# Patient Record
Sex: Female | Born: 1940 | Race: White | Hispanic: No | Marital: Married | Smoking: Former smoker
Health system: Southern US, Community
[De-identification: ages and names within clinical notes are randomized; demographics above are authoritative.]

## PROBLEM LIST (undated history)

## (undated) DIAGNOSIS — M810 Age-related osteoporosis without current pathological fracture: Secondary | ICD-10-CM

## (undated) DIAGNOSIS — K5792 Diverticulitis of intestine, part unspecified, without perforation or abscess without bleeding: Secondary | ICD-10-CM

## (undated) DIAGNOSIS — E785 Hyperlipidemia, unspecified: Secondary | ICD-10-CM

## (undated) DIAGNOSIS — K219 Gastro-esophageal reflux disease without esophagitis: Secondary | ICD-10-CM

## (undated) DIAGNOSIS — Z8489 Family history of other specified conditions: Secondary | ICD-10-CM

## (undated) DIAGNOSIS — I219 Acute myocardial infarction, unspecified: Secondary | ICD-10-CM

## (undated) DIAGNOSIS — I951 Orthostatic hypotension: Secondary | ICD-10-CM

## (undated) DIAGNOSIS — T8859XA Other complications of anesthesia, initial encounter: Secondary | ICD-10-CM

## (undated) DIAGNOSIS — Z8619 Personal history of other infectious and parasitic diseases: Secondary | ICD-10-CM

## (undated) DIAGNOSIS — I10 Essential (primary) hypertension: Secondary | ICD-10-CM

## (undated) DIAGNOSIS — C4491 Basal cell carcinoma of skin, unspecified: Secondary | ICD-10-CM

## (undated) DIAGNOSIS — D239 Other benign neoplasm of skin, unspecified: Secondary | ICD-10-CM

## (undated) HISTORY — PX: GUM SURGERY: SHX658

## (undated) HISTORY — DX: Other benign neoplasm of skin, unspecified: D23.9

## (undated) HISTORY — DX: Hyperlipidemia, unspecified: E78.5

## (undated) HISTORY — DX: Acute myocardial infarction, unspecified: I21.9

## (undated) HISTORY — DX: Diverticulitis of intestine, part unspecified, without perforation or abscess without bleeding: K57.92

## (undated) HISTORY — DX: Gastro-esophageal reflux disease without esophagitis: K21.9

## (undated) HISTORY — DX: Essential (primary) hypertension: I10

## (undated) HISTORY — PX: OTHER SURGICAL HISTORY: SHX169

## (undated) HISTORY — DX: Basal cell carcinoma of skin, unspecified: C44.91

## (undated) HISTORY — DX: Personal history of other infectious and parasitic diseases: Z86.19

## (undated) HISTORY — DX: Age-related osteoporosis without current pathological fracture: M81.0

## (undated) HISTORY — PX: ABDOMINAL HYSTERECTOMY: SHX81

---

## 2003-09-07 ENCOUNTER — Other Ambulatory Visit: Payer: Self-pay

## 2004-05-13 ENCOUNTER — Encounter: Payer: Self-pay | Admitting: Unknown Physician Specialty

## 2004-06-13 ENCOUNTER — Encounter: Payer: Self-pay | Admitting: Unknown Physician Specialty

## 2004-06-26 ENCOUNTER — Ambulatory Visit: Payer: Self-pay | Admitting: Specialist

## 2004-07-13 ENCOUNTER — Encounter: Payer: Self-pay | Admitting: Unknown Physician Specialty

## 2004-08-13 ENCOUNTER — Encounter: Payer: Self-pay | Admitting: Unknown Physician Specialty

## 2004-09-12 ENCOUNTER — Ambulatory Visit: Payer: Self-pay | Admitting: Specialist

## 2004-09-13 ENCOUNTER — Encounter: Payer: Self-pay | Admitting: Unknown Physician Specialty

## 2004-11-02 ENCOUNTER — Ambulatory Visit: Payer: Self-pay | Admitting: Specialist

## 2005-03-12 ENCOUNTER — Ambulatory Visit: Payer: Self-pay | Admitting: Specialist

## 2005-10-29 ENCOUNTER — Ambulatory Visit: Payer: Self-pay | Admitting: Specialist

## 2005-11-06 ENCOUNTER — Ambulatory Visit: Payer: Self-pay | Admitting: Specialist

## 2005-11-12 ENCOUNTER — Ambulatory Visit: Payer: Self-pay | Admitting: Unknown Physician Specialty

## 2005-12-31 ENCOUNTER — Ambulatory Visit: Payer: Self-pay | Admitting: Unknown Physician Specialty

## 2006-02-11 ENCOUNTER — Ambulatory Visit: Payer: Self-pay | Admitting: Specialist

## 2006-09-20 ENCOUNTER — Ambulatory Visit: Payer: Self-pay | Admitting: Specialist

## 2006-11-19 ENCOUNTER — Ambulatory Visit: Payer: Self-pay | Admitting: Specialist

## 2007-11-11 DIAGNOSIS — C4491 Basal cell carcinoma of skin, unspecified: Secondary | ICD-10-CM

## 2007-11-11 HISTORY — DX: Basal cell carcinoma of skin, unspecified: C44.91

## 2007-11-20 ENCOUNTER — Ambulatory Visit: Payer: Self-pay | Admitting: Internal Medicine

## 2007-11-25 ENCOUNTER — Encounter: Payer: Self-pay | Admitting: Internal Medicine

## 2007-12-08 ENCOUNTER — Ambulatory Visit: Payer: Self-pay | Admitting: Internal Medicine

## 2007-12-12 ENCOUNTER — Encounter: Payer: Self-pay | Admitting: Internal Medicine

## 2008-11-09 DIAGNOSIS — L570 Actinic keratosis: Secondary | ICD-10-CM

## 2008-11-09 HISTORY — DX: Actinic keratosis: L57.0

## 2008-12-06 ENCOUNTER — Ambulatory Visit: Payer: Self-pay | Admitting: Internal Medicine

## 2009-01-13 ENCOUNTER — Encounter: Payer: Self-pay | Admitting: Obstetrics & Gynecology

## 2009-02-10 ENCOUNTER — Encounter: Payer: Self-pay | Admitting: Obstetrics & Gynecology

## 2009-03-13 ENCOUNTER — Encounter: Payer: Self-pay | Admitting: Obstetrics & Gynecology

## 2009-12-08 ENCOUNTER — Ambulatory Visit: Payer: Self-pay | Admitting: Internal Medicine

## 2010-12-11 ENCOUNTER — Ambulatory Visit: Payer: Self-pay | Admitting: Internal Medicine

## 2011-12-21 ENCOUNTER — Ambulatory Visit: Payer: Self-pay | Admitting: Unknown Physician Specialty

## 2011-12-25 LAB — PATHOLOGY REPORT

## 2012-01-22 ENCOUNTER — Ambulatory Visit: Payer: Self-pay | Admitting: Internal Medicine

## 2012-12-07 ENCOUNTER — Emergency Department: Payer: Self-pay | Admitting: Internal Medicine

## 2012-12-07 LAB — URINALYSIS, COMPLETE
Bacteria: NONE SEEN
RBC,UR: 11359 /HPF (ref 0–5)
Specific Gravity: 1.014 (ref 1.003–1.030)
Squamous Epithelial: NONE SEEN
WBC UR: 1157 /HPF (ref 0–5)

## 2012-12-09 LAB — URINE CULTURE

## 2013-01-30 ENCOUNTER — Ambulatory Visit: Payer: Self-pay | Admitting: Internal Medicine

## 2013-05-14 ENCOUNTER — Emergency Department: Payer: Self-pay | Admitting: Emergency Medicine

## 2013-05-17 ENCOUNTER — Emergency Department: Payer: Self-pay | Admitting: Emergency Medicine

## 2013-05-17 LAB — COMPREHENSIVE METABOLIC PANEL
Albumin: 3.7 g/dL (ref 3.4–5.0)
Alkaline Phosphatase: 62 U/L (ref 50–136)
Anion Gap: 3 — ABNORMAL LOW (ref 7–16)
BUN: 10 mg/dL (ref 7–18)
Bilirubin,Total: 0.3 mg/dL (ref 0.2–1.0)
Calcium, Total: 8.7 mg/dL (ref 8.5–10.1)
Chloride: 103 mmol/L (ref 98–107)
Co2: 30 mmol/L (ref 21–32)
Creatinine: 0.7 mg/dL (ref 0.60–1.30)
EGFR (African American): >60
EGFR (Non-African Amer.): >60
Glucose: 94 mg/dL (ref 65–99)
Osmolality: 271 (ref 275–301)
Potassium: 3.9 mmol/L (ref 3.5–5.1)
SGOT(AST): 24 U/L (ref 15–37)
SGPT (ALT): 17 U/L (ref 12–78)
Sodium: 136 mmol/L (ref 136–145)
Total Protein: 6.7 g/dL (ref 6.4–8.2)

## 2013-05-17 LAB — URINALYSIS, COMPLETE
Bacteria: NONE SEEN
Bilirubin,UR: NEGATIVE
Blood: NEGATIVE
Glucose,UR: NEGATIVE mg/dL (ref 0–75)
Ketone: NEGATIVE
Leukocyte Esterase: NEGATIVE
Nitrite: NEGATIVE
Ph: 7 (ref 4.5–8.0)
Protein: NEGATIVE
RBC,UR: 3 /HPF (ref 0–5)
Specific Gravity: 1.013 (ref 1.003–1.030)
Squamous Epithelial: <1
WBC UR: <1 /HPF (ref 0–5)

## 2013-05-17 LAB — LIPASE, BLOOD: Lipase: 159 U/L (ref 73–393)

## 2013-05-17 LAB — CBC
HCT: 41.7 % (ref 35.0–47.0)
HGB: 14.5 g/dL (ref 12.0–16.0)
MCH: 29.2 pg (ref 26.0–34.0)
MCHC: 34.6 g/dL (ref 32.0–36.0)
MCV: 84 fL (ref 80–100)
Platelet: 239 10*3/uL (ref 150–440)
RBC: 4.95 10*6/uL (ref 3.80–5.20)
RDW: 13.6 % (ref 11.5–14.5)
WBC: 7.7 10*3/uL (ref 3.6–11.0)

## 2013-08-13 DIAGNOSIS — Z8619 Personal history of other infectious and parasitic diseases: Secondary | ICD-10-CM

## 2013-08-13 HISTORY — DX: Personal history of other infectious and parasitic diseases: Z86.19

## 2014-01-28 ENCOUNTER — Ambulatory Visit: Payer: Self-pay | Admitting: Ophthalmology

## 2014-01-28 DIAGNOSIS — I499 Cardiac arrhythmia, unspecified: Secondary | ICD-10-CM

## 2014-01-28 DIAGNOSIS — Z01810 Encounter for preprocedural cardiovascular examination: Secondary | ICD-10-CM

## 2014-02-01 ENCOUNTER — Ambulatory Visit: Payer: Self-pay | Admitting: Internal Medicine

## 2014-02-22 ENCOUNTER — Ambulatory Visit: Payer: Self-pay | Admitting: Ophthalmology

## 2014-11-03 DIAGNOSIS — I219 Acute myocardial infarction, unspecified: Secondary | ICD-10-CM

## 2014-11-03 HISTORY — PX: CARDIAC CATHETERIZATION: SHX172

## 2014-11-03 HISTORY — DX: Acute myocardial infarction, unspecified: I21.9

## 2014-11-26 ENCOUNTER — Ambulatory Visit
Admit: 2014-11-26 | Disposition: A | Discharge status: Home / Self Care | Payer: Self-pay | Attending: Internal Medicine | Admitting: Internal Medicine

## 2014-11-26 ENCOUNTER — Other Ambulatory Visit: Payer: Self-pay | Admitting: Internal Medicine

## 2014-11-26 DIAGNOSIS — Z1231 Encounter for screening mammogram for malignant neoplasm of breast: Secondary | ICD-10-CM

## 2014-12-01 ENCOUNTER — Encounter (INDEPENDENT_AMBULATORY_CARE_PROVIDER_SITE_OTHER): Payer: Self-pay

## 2014-12-01 ENCOUNTER — Encounter: Payer: Self-pay | Admitting: Cardiovascular Disease

## 2014-12-01 ENCOUNTER — Ambulatory Visit (INDEPENDENT_AMBULATORY_CARE_PROVIDER_SITE_OTHER): Payer: Medicare Other | Admitting: Cardiovascular Disease

## 2014-12-01 VITALS — BP 148/76 | HR 55 | Ht 62.0 in | Wt 108.5 lb

## 2014-12-01 DIAGNOSIS — R5383 Other fatigue: Secondary | ICD-10-CM

## 2014-12-01 DIAGNOSIS — R55 Syncope and collapse: Secondary | ICD-10-CM | POA: Diagnosis not present

## 2014-12-01 DIAGNOSIS — F419 Anxiety disorder, unspecified: Secondary | ICD-10-CM

## 2014-12-01 DIAGNOSIS — I5181 Takotsubo syndrome: Secondary | ICD-10-CM

## 2014-12-01 DIAGNOSIS — E785 Hyperlipidemia, unspecified: Secondary | ICD-10-CM

## 2014-12-01 DIAGNOSIS — I420 Dilated cardiomyopathy: Secondary | ICD-10-CM | POA: Insufficient documentation

## 2014-12-01 MED ORDER — CARVEDILOL 3.125 MG PO TABS: 3.1250 mg | ORAL_TABLET | Freq: Two times a day (BID) | ORAL | Status: DC

## 2014-12-01 MED ORDER — LISINOPRIL 2.5 MG PO TABS: 2.5000 mg | ORAL_TABLET | Freq: Every day | ORAL | Status: DC

## 2014-12-01 NOTE — Progress Notes (Signed)
Patient ID: Debra Moody, female    DOB: [birth year], 74 y.o.   MRN: [id number]  HPI Comments: [name] is a pleasant 74 year old woman with previous history of GERD, diverticulitis, hysterectomy in the 80s, prior bladder surgery, elbow fracture October 2014, shingles September 2015 presents to establish care after recent episode of chest pain, elevated cardiac enzymes, termination of stress related cardiomyopathy.  She reports that she was traveling out of state, was in [place] when she developed amnesia type symptoms. Her husband took her to the hospital. She had MRI and CT of the head that showed no stroke. Further workup showed abnormal EKG, abnormal echocardiogram, elevated troponin up to more than 3. Ischemia could not be excluded and she was taken to the cardiac catheterization lab 11/03/2014. 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 Denies having shortness of breath. She has been drinking significant amounts of fluid thinking she needed to do this to washout the medications.  No lower extremity edema or weight gain. Possibly has some abdominal bloating. Lab work done in the hospital March 2016 shows total cholesterol 199, LDL 113, HDL 79  EKG on today's visit shows normal sinus rhythm with rate 55 bpm, T-wave abnormality noted in lead 1 and aVL   Allergies  Allergen Reactions  . Alendronate Sodium Other (See Comments)  . Ibandronic Acid Other (See Comments)  . Simvastatin Other (See Comments)    Myalgia.  Marland Kitchen Amoxicillin-Pot Clavulanate Rash     Outpatient Encounter Prescriptions as of 12/01/2014  Medication Sig  . aspirin 81 MG tablet Take 81 mg by mouth daily.  . Biotin 1000 MCG tablet Take 1,000 mcg by mouth daily.  . Calcium-Magnesium-Vitamin D (CALCIUM 500 PO) Take 1,000 mg by mouth daily.  . carvedilol (COREG) 3.125 MG tablet Take 1 tablet (3.125 mg total) by mouth 2 (two) times daily with a meal.  . cetirizine (ZYRTEC) 10 MG tablet Take 10 mg by mouth daily.  . Cetirizine HCl 10 MG CAPS Take 10 mg by mouth daily.   . Coenzyme Q10 (COQ10) 100 MG CAPS Take 100 mg by mouth daily.  . diphenhydrAMINE (BENADRYL) 25 mg capsule Take 25 mg by mouth every 6 (six) hours as needed.   . ezetimibe (ZETIA) 10 MG tablet Take 10 mg by mouth daily.  . hyoscyamine (LEVSIN, ANASPAZ) 0.125 MG tablet Take by mouth.  . magnesium oxide (MAG-OX) 400 MG tablet Take 400 mg by mouth daily.  . Melatonin 5 MG TBDP Take 5 mg by mouth as needed.  . Omega-3 Fatty Acids (FISH OIL) 1000 MG CAPS Take 1,200 mg by mouth daily.   . pantoprazole (PROTONIX) 40 MG tablet Take 40 mg by mouth 2 (two) times daily.  Marland Kitchen Phenylephrine-Acetaminophen 5-325 MG TABS Take by mouth as needed.   . Polyethylene Glycol 3350 (MIRALAX PO) Take by mouth every evening.  . Probiotic Product (PROBIOTIC DAILY PO) Take by mouth daily.  . ranitidine (ZANTAC) 75 MG tablet Take 150 mg by mouth 2 (two) times daily.  . Simethicone 125 MG TABS Take by mouth as needed.   . Vitamin D, Cholecalciferol, 400 UNITS CAPS  Take by mouth daily.  . [DISCONTINUED] carvedilol (COREG) 3.125 MG tablet Take 3.125 mg by mouth 2 (two) times daily with a meal.   . fexofenadine (ALLEGRA) 180 MG tablet Take by mouth.  Marland Kitchen lisinopril (PRINIVIL,ZESTRIL) 2.5 MG tablet Take 1 tablet (2.5 mg total) by mouth daily.  . Melatonin 5 MG TABS Take by mouth.  . [DISCONTINUED] lisinopril (PRINIVIL,ZESTRIL) 2.5 MG tablet Take by mouth.  . [DISCONTINUED] Polyethyl Glycol-Propyl Glycol 0.4-0.3 % GEL Apply to eye.    Past  Medical History  Diagnosis Date  . Osteoporosis   . Hypertension   . History of shingles 2015  . Diverticulitis   . 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 Skin cancer, basal cell   . GERD (gastroesophageal reflux disease)   . Hyperlipidemia     Past Surgical History  Procedure Laterality Date  . Abdominal hysterectomy    . Gum surgery    . Cardiac catheterization  11/03/2014    Social History  reports that she quit smoking about 15 years ago. Her smoking use included Cigarettes. She quit after 18 years of use. She does not have any smokeless tobacco history on file. She reports that she does not drink alcohol or use illicit drugs.  Family History family history includes Heart attack in her mother; Heart attack (age of onset: 4) in her father; Heart disease in her father and mother.  Review of Systems  Constitutional: Positive for fatigue.  Respiratory: Negative.   Cardiovascular: Negative.   Gastrointestinal: Negative.   Musculoskeletal: Negative.   Skin: Negative.   Neurological: Positive for weakness.  Hematological: Negative.   Psychiatric/Behavioral: Negative.   All other systems reviewed and are negative.   BP 148/76 mmHg  Pulse 55  Ht 5\' 2"  (1.575 m)  Wt 108 lb 8 oz (49.215 kg)  BMI 19.84 kg/m2  Physical Exam  Constitutional: She is oriented to person, place, and time. She appears well-developed and well-nourished.  HENT:  Head: Normocephalic.  Nose: Nose normal.  Mouth/Throat: Oropharynx is clear and moist.  Eyes: Conjunctivae are normal. Pupils are equal, round, and reactive to light.  Neck: Normal range of motion. Neck supple. No JVD present.  Cardiovascular: Normal rate, regular rhythm, S1 normal, S2 normal, normal heart sounds and intact distal pulses.  Exam reveals no gallop and no friction rub.   No murmur heard. Pulmonary/Chest: Effort normal and breath sounds normal. No respiratory distress. She has no  wheezes. She has no rales. She exhibits no tenderness.  Abdominal: Soft. Bowel sounds are normal. She exhibits no distension. There is no tenderness.  Musculoskeletal: Normal range of motion. She exhibits no edema or tenderness.  Lymphadenopathy:    She has no cervical adenopathy.  Neurological: She is alert and oriented to person, place, and time. Coordination normal.  Skin: Skin is warm and dry. No rash noted. No erythema.  Psychiatric: She has a normal mood and affect. Her behavior is normal. Judgment and thought content normal.    Assessment and Plan  Nursing note and vitals reviewed.

## 2014-12-01 NOTE — Assessment & Plan Note (Signed)
She reports being unable to tolerate statins. She'll continue on Zetia 10 mg daily Most recent total cholesterol 199

## 2014-12-01 NOTE — Patient Instructions (Signed)
You are doing well.  Please continue the coreg/carvedilol twice a day  Change the lisinopril to dinner or evening  We will order an echocardiogram for cardiomyopathy  Consider cardiac rehab  Please call us if you have new issues that need to be addressed before your next appt.  Your physician wants you to follow-up in: 3 months.  You will receive a reminder letter in the mail two months in advance. If you don't receive a letter, please call our office to schedule the follow-up appointment.

## 2014-12-01 NOTE — Assessment & Plan Note (Signed)
Recent cardiac catheterization confirming normal coronaries though reversible coronary spasm documented. Stress related cardiomyopathy suggested in catheterization report.  Currently feels better, tolerating low-dose Coreg and lisinopril. Echocardiogram report not seen though while in the hospital in March 2016, there was reference to depressed ejection fraction. She is requesting repeat echocardiogram to evaluate her EF. This will be ordered for her

## 2014-12-01 NOTE — Assessment & Plan Note (Signed)
She reports that she takes alprazolam low-dose as needed for anxiety, sleep

## 2014-12-01 NOTE — Assessment & Plan Note (Signed)
By catheterization, ejection fraction estimated at 25%. Also with regions of akinesis consistent with stress-related cardiopathy. Unable to advance her carvedilol given her low heart rate. She also reports having low blood pressure at home with periods of orthostasis. We'll continue low-dose lisinopril for now

## 2014-12-03 ENCOUNTER — Encounter
Admit: 2014-12-03 | Disposition: A | Discharge status: Home / Self Care | Payer: Self-pay | Attending: Internal Medicine | Admitting: Internal Medicine

## 2014-12-04 NOTE — Op Note (Signed)
PATIENT NAME:  Debra Moody, Debra Moody MR#:  [id number] DATE OF BIRTH:  [birth year]  DATE OF PROCEDURE:  02/22/2014  PREOPERATIVE DIAGNOSIS: Cataract, right eye.   POSTOPERATIVE DIAGNOSIS: Cataract, right eye.   PROCEDURE PERFORMED: Extracapsular cataract extraction using phacoemulsification with placement of Alcon [id number], 17.0-diopter posterior chamber lens, serial number [id number].   ANESTHESIA: 4% lidocaine and 0.75% Marcaine a 50-50 mixture with 10 units/mL of HyoMax added, given as a peribulbar.   ANESTHESIOLOGIST: Dr. Verl Dicker   COMPLICATIONS: None.   ESTIMATED BLOOD LOSS: Less than 1 mL.  ALLERGIES: LATEX.   DESCRIPTION OF PROCEDURE: The patient was latex allergic to latex precautions were taken throughout the procedure. The patient was brought to the operating room and given IV sedation and peribulbar block. She was then prepped and draped in the usual fashion. The vertical rectus muscles were imbricated using 5-0 silk sutures, bridle sutures, and hemostasis was obtained with a superior limbus with cautery. A partial thickness scleral groove was made at the limbus and dissected anteriorly into the clear cornea with an Alcon crescent knife. The anterior chamber was entered superonasally through clear cornea with a paracentesis knife and through the lamellar dissection with a 2.6-mm keratome. DisCoVisc was used to place the aqueous. A continuous tear circular capsulorrhexis was carried out. Hydrodissection was used to loosen the nucleus and phacoemulsification was carried out in divide and conquer technique. Total ultrasound time was 1 minute and 14 seconds with an average power of 15.9%. CDE was 20.20. Irrigation-aspiration was used to remove the residual cortex. The capsular bag was inflated with DisCoVisc and the intraocular lens was inserted in the capsular bag using a [employer]. Irrigation-aspiration was used to remove the residual DisCoVisc. The wound was inflated with balanced salt and  found to be leak free. Miochol was used instead of Miostat due to latex allergy. This was injected via the paracentesis track. The wound was checked for leaks again and none were found. A tenth of a millimeter of cefuroxime was injected via the paracentesis track containing 1 mg of drug. The wound was checked for leaks a third time and none were found. The bridle sutures were removed and 2 drops of Vigamox were placed in the eye. A shield was placed on the eye and the patient was discharged to the recovery room in good condition    ____________________________ Loura Back. Powell Halbert, MD sad:lt D: 02/22/2014 12:51:30 ET T: 02/23/2014 00:31:12 ET JOB#: [id number]  cc: Remo Lipps A. Bonny Vanleeuwen, MD, <Dictator> Martie Lee MD ELECTRONICALLY SIGNED 03/01/2014 12:33

## 2014-12-13 ENCOUNTER — Other Ambulatory Visit: Payer: Self-pay | Admitting: Cardiovascular Disease

## 2014-12-13 DIAGNOSIS — I429 Cardiomyopathy, unspecified: Secondary | ICD-10-CM

## 2014-12-20 ENCOUNTER — Encounter: Payer: Medicare Other | Attending: Internal Medicine | Admitting: *Deleted

## 2014-12-20 DIAGNOSIS — I214 Non-ST elevation (NSTEMI) myocardial infarction: Secondary | ICD-10-CM | POA: Diagnosis present

## 2014-12-20 NOTE — Progress Notes (Signed)
Daily Session Note  Patient Details  Name: Debra Moody MRN: [id number] Date of Birth: 1940/10/08 Referring Provider:  Rusty Aus, MD  Encounter Date: 12/20/2014  Check In:     Session Check In - 12/20/14 1011    Check-In   Staff Present Heath Lark RN, BSN, CCRP;Eduin Friedel [place] MS, ACSM CEP Exercise Physiologist;Kelly Alfonso Patten, ACSM CEP Exercise Physiologist   ER physicians immediately available to respond to emergencies See telemetry face sheet for immediately available ER MD   Warm-up and Cool-down Performed on first and last piece of equipment   VAD Patient? No   Pain Assessment   Currently in Pain? No/denies   Multiple Pain Sites No         Goals Met:  Proper associated with RPD/PD & O2 Sat Exercise tolerated well Personal goals reviewed  Goals Unmet:  Not Applicable  Goals Comments: No cardiac symptoms reported; daily exercise goals completed. Went over all equipment and new patient orientation. Debra Moody did well on the equipment and confirmed understanding of her goals and the program structure.      Dr. Emily Filbert is Medical Director for [facility] and [facility].

## 2014-12-21 ENCOUNTER — Ambulatory Visit (INDEPENDENT_AMBULATORY_CARE_PROVIDER_SITE_OTHER): Payer: Medicare Other

## 2014-12-21 ENCOUNTER — Other Ambulatory Visit: Payer: Self-pay

## 2014-12-21 DIAGNOSIS — I420 Dilated cardiomyopathy: Secondary | ICD-10-CM | POA: Diagnosis not present

## 2014-12-21 DIAGNOSIS — I429 Cardiomyopathy, unspecified: Secondary | ICD-10-CM

## 2014-12-22 ENCOUNTER — Encounter: Payer: Medicare Other | Admitting: *Deleted

## 2014-12-22 DIAGNOSIS — I214 Non-ST elevation (NSTEMI) myocardial infarction: Secondary | ICD-10-CM | POA: Diagnosis not present

## 2014-12-22 NOTE — Progress Notes (Signed)
Daily Session Note  Patient Details  Name: Debra Moody MRN: [id number] Date of Birth: Nov 17, 1940 Referring Provider:  Rusty Aus, MD  Encounter Date: 12/22/2014  Check In:     Session Check In - 12/22/14 0846    Check-In   Staff Present Heath Lark RN, BSN, CCRP;Averee Harb Dillard Essex MS, ACSM CEP Exercise Physiologist;Steven Way BS, ACSM EP-C, Exercise Physiologist   ER physicians immediately available to respond to emergencies See telemetry face sheet for immediately available ER MD   Medication changes reported     No   Fall or balance concerns reported    No   Warm-up and Cool-down Performed on first and last piece of equipment   VAD Patient? No   Pain Assessment   Currently in Pain? No/denies   Multiple Pain Sites No         Goals Met:  Proper associated with RPD/PD & O2 Sat Independence with exercise equipment Personal goals reviewed No report of cardiac concerns or symptoms  Goals Unmet:  Not Applicable  Goals Comments: No cardiac symptoms reported; daily exercise goals completed     Dr. Emily Filbert is Medical Director for [place] [facility].

## 2014-12-23 DIAGNOSIS — I214 Non-ST elevation (NSTEMI) myocardial infarction: Secondary | ICD-10-CM | POA: Diagnosis not present

## 2014-12-23 NOTE — Progress Notes (Signed)
Daily Session Note  Patient Details  Name: Debra Moody MRN: [id number] Date of Birth: 1940-11-18 Referring Provider:  Rusty Aus, MD  Encounter Date: 12/23/2014  Check In:     Session Check In - 12/23/14 1700    Check-In   Staff Present Lestine Box BS, ACSM EP-C, Exercise Physiologist;Carroll Enterkin RN, BSN;Diane Joya Gaskins RN, BSN   ER physicians immediately available to respond to emergencies See telemetry face sheet for immediately available ER MD   Medication changes reported     No   Fall or balance concerns reported    No   Warm-up and Cool-down Performed on first and last piece of equipment   VAD Patient? No   Pain Assessment   Currently in Pain? No/denies         Goals Met:  Proper associated with RPD/PD & O2 Sat Exercise tolerated well No report of cardiac concerns or symptoms Strength training completed today  Goals Unmet:  Not Applicable  Goals Comments:    Dr. Emily Filbert is Medical Director for [facility] and [facility].

## 2014-12-27 ENCOUNTER — Encounter: Payer: Medicare Other | Admitting: *Deleted

## 2014-12-27 DIAGNOSIS — I214 Non-ST elevation (NSTEMI) myocardial infarction: Secondary | ICD-10-CM | POA: Diagnosis not present

## 2014-12-27 NOTE — Progress Notes (Signed)
Daily Session Note  Patient Details  Name: Debra Moody MRN: [id number] Date of Birth: 05/04/1941 Referring Provider:  Miller, Mark F, MD  Encounter Date: 12/27/2014  Check In:     Session Check In - 12/27/14 0832    Check-In   Staff Present Susanne Bice RN, BSN, CCRP;Kelly Hayes BS, ACSM CEP Exercise Physiologist;Renee MacMillan MS, ACSM CEP Exercise Physiologist   ER physicians immediately available to respond to emergencies See telemetry face sheet for immediately available ER MD   Medication changes reported     No   Fall or balance concerns reported    No   Warm-up and Cool-down Performed on first and last piece of equipment   VAD Patient? No   Pain Assessment   Currently in Pain? No/denies   Multiple Pain Sites No         Goals Met:  Independence with exercise equipment Exercise tolerated well No report of cardiac concerns or symptoms  Goals Unmet:  Not Applicable  Goals Comments:   Dr. Mark Miller is Medical Director for [facility] and [facility]. 

## 2014-12-29 DIAGNOSIS — I214 Non-ST elevation (NSTEMI) myocardial infarction: Secondary | ICD-10-CM | POA: Diagnosis not present

## 2014-12-29 NOTE — Progress Notes (Signed)
Daily Session Note  Patient Details  Name: TARAJI MUNGO MRN: [id number] Date of Birth: [birth year] Referring Provider:  Rusty Aus, MD  Encounter Date: 12/29/2014  Check In:     Session Check In - 12/29/14 0847    Check-In   Staff Present Heath Lark RN, BSN, CCRP;Jaine Estabrooks BS, ACSM EP-C, Exercise Physiologist;Renee Dillard Essex MS, ACSM CEP Exercise Physiologist   ER physicians immediately available to respond to emergencies See telemetry face sheet for immediately available ER MD   Medication changes reported     No   Fall or balance concerns reported    No   Warm-up and Cool-down Performed on first and last piece of equipment   VAD Patient? No   Pain Assessment   Currently in Pain? No/denies         Goals Met:  Proper associated with RPD/PD & O2 Sat Exercise tolerated well No report of cardiac concerns or symptoms Strength training completed today  Goals Unmet:  Not Applicable  Goals Comments:    Dr. Emily Filbert is Medical Director for [place] [facility].

## 2014-12-31 ENCOUNTER — Encounter: Payer: Medicare Other | Admitting: *Deleted

## 2014-12-31 DIAGNOSIS — I214 Non-ST elevation (NSTEMI) myocardial infarction: Secondary | ICD-10-CM | POA: Diagnosis not present

## 2014-12-31 NOTE — Progress Notes (Signed)
Daily Session Note  Patient Details  Name: Debra Moody MRN: [id number] Date of Birth: 07/26/1941 Referring Provider:  Miller, Mark F, MD  Encounter Date: 12/31/2014  Check In:      Goals Met:  Independence with exercise equipment Changing diet to healthy choices, watchign portion sizes Strength training completed today  Goals Unmet:  Not Applicable  Goals Comments:    Dr. Mark Miller is Medical Director for [facility] and [facility]. 

## 2015-01-02 ENCOUNTER — Encounter: Payer: Self-pay | Admitting: *Deleted

## 2015-01-02 NOTE — Progress Notes (Signed)
Input data from previous EMR to update the Individualized Treatment Plan.

## 2015-01-03 ENCOUNTER — Encounter: Payer: Medicare Other | Admitting: *Deleted

## 2015-01-03 DIAGNOSIS — I214 Non-ST elevation (NSTEMI) myocardial infarction: Secondary | ICD-10-CM | POA: Diagnosis not present

## 2015-01-03 NOTE — Progress Notes (Signed)
Daily Session Note  Patient Details  Name: Debra Moody MRN: [id number] Date of Birth: 11/17/1940 Referring Provider:  Miller, Mark F, MD  Encounter Date: 01/03/2015  Check In:     Session Check In - 01/03/15 0957    Check-In   Staff Present Susanne Bice RN, BSN, CCRP;Kelly Hayes BS, ACSM CEP Exercise Physiologist;Renee MacMillan MS, ACSM CEP Exercise Physiologist   ER physicians immediately available to respond to emergencies See telemetry face sheet for immediately available ER MD   Medication changes reported     No   Fall or balance concerns reported    No   Warm-up and Cool-down Performed on first and last piece of equipment   VAD Patient? No   Pain Assessment   Currently in Pain? No/denies   Multiple Pain Sites No         Goals Met:  Independence with exercise equipment Exercise tolerated well No report of cardiac concerns or symptoms  Goals Unmet:  Not Applicable  Goals Comments:    Dr. Mark Miller is Medical Director for [facility] and [facility]. 

## 2015-01-04 ENCOUNTER — Encounter: Payer: Self-pay | Admitting: *Deleted

## 2015-01-04 NOTE — Progress Notes (Signed)
Cardiac Individual Treatment Plan  Patient Details  Name: Debra Moody MRN: [id number] Date of Birth: Aug 09, 1941 Referring Provider:  Dr. Loleta Chance Initial Encounter Date:  12/29/2014  Patient's Home Medications on Admission:  Current outpatient prescriptions:  .  aspirin 81 MG tablet, Take 81 mg by mouth daily., Disp: , Rfl:  .  Biotin 1000 MCG tablet, Take 1,000 mcg by mouth daily., Disp: , Rfl:  .  Calcium-Magnesium-Vitamin D (CALCIUM 500 PO), Take 1,000 mg by mouth daily., Disp: , Rfl:  .  carvedilol (COREG) 3.125 MG tablet, Take 1 tablet (3.125 mg total) by mouth 2 (two) times daily with a meal., Disp: 180 tablet, Rfl: 3 .  cetirizine (ZYRTEC) 10 MG tablet, Take 10 mg by mouth daily., Disp: , Rfl:  .  Cetirizine HCl 10 MG CAPS, Take 10 mg by mouth daily. , Disp: , Rfl:  .  Coenzyme Q10 (COQ10) 100 MG CAPS, Take 100 mg by mouth daily., Disp: , Rfl:  .  diphenhydrAMINE (BENADRYL) 25 mg capsule, Take 25 mg by mouth every 6 (six) hours as needed. , Disp: , Rfl:  .  ezetimibe (ZETIA) 10 MG tablet, Take 10 mg by mouth daily., Disp: , Rfl:  .  fexofenadine (ALLEGRA) 180 MG tablet, Take by mouth., Disp: , Rfl:  .  hyoscyamine (LEVSIN, ANASPAZ) 0.125 MG tablet, Take by mouth., Disp: , Rfl:  .  lisinopril (PRINIVIL,ZESTRIL) 2.5 MG tablet, Take 1 tablet (2.5 mg total) by mouth daily., Disp: 90 tablet, Rfl: 3 .  magnesium oxide (MAG-OX) 400 MG tablet, Take 400 mg by mouth daily., Disp: , Rfl:  .  Melatonin 5 MG TABS, Take by mouth., Disp: , Rfl:  .  Melatonin 5 MG TBDP, Take 5 mg by mouth as needed., Disp: , Rfl:  .  Omega-3 Fatty Acids (FISH OIL) 1000 MG CAPS, Take 1,200 mg by mouth daily. , Disp: , Rfl:  .  pantoprazole (PROTONIX) 40 MG tablet, Take 40 mg by mouth 2 (two) times daily., Disp: , Rfl:  .  Phenylephrine-Acetaminophen 5-325 MG TABS, Take by mouth as needed. , Disp: , Rfl:  .  Polyethylene Glycol 3350 (MIRALAX PO), Take by mouth every evening., Disp: , Rfl:  .  Probiotic Product  (PROBIOTIC DAILY PO), Take by mouth daily., Disp: , Rfl:  .  ranitidine (ZANTAC) 75 MG tablet, Take 150 mg by mouth 2 (two) times daily., Disp: , Rfl:  .  Simethicone 125 MG TABS, Take by mouth as needed. , Disp: , Rfl:  .  Vitamin D, Cholecalciferol, 400 UNITS CAPS, Take by mouth daily., Disp: , Rfl:   Past Medical History: Past Medical History  Diagnosis Date  . Osteoporosis   . Hypertension   . History of shingles 2015  . Diverticulitis   . 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 Skin cancer, basal cell   . GERD (gastroesophageal reflux disease)   . Hyperlipidemia     Tobacco Use: History  Smoking status  . Former Smoker -- 18 years  . Types: Cigarettes  . Quit date: 12/01/1999  Smokeless tobacco  . Not on file    Labs: Recent Review Flowsheet Data    There is no flowsheet data to display.       Exercise Target Goals:    Exercise Program Goal: Individual exercise prescription set with THRR, safety & activity barriers. Participant demonstrates ability to understand and report RPE using BORG scale, to self-measure pulse  accurately, and to acknowledge the importance of the exercise prescription.  Exercise Prescription Goal: Starting with aerobic activity 30 plus minutes a day, 3 days per week for initial exercise prescription. Provide home exercise prescription and guidelines that participant acknowledges understanding prior to discharge.  Activity Barriers & Risk Stratification:     Activity Barriers & Risk Stratification - 01/02/15 1158    Activity Barriers & Risk Stratification   Activity Barriers History of Falls   Risk Stratification High      6 Minute Walk:     6 Minute Walk      12/06/14 1200       6 Minute Walk   Phase Initial     Distance 1910 feet     Walk Time 6 minutes     Resting HR 66 bpm     Resting BP 122/70 mmHg     Max Ex. HR 130 bpm     Max Ex. BP 152/58 mmHg     RPE 13     Symptoms No         Initial Exercise Prescription:   Exercise Prescription Changes:     Exercise Prescription Changes      12/29/14 0800 01/04/15 0800         Exercise Review   Progression Yes Yes      Response to Exercise   Blood Pressure (Admit)  102/64 mmHg      Blood Pressure (Exercise)  124/60 mmHg      Blood Pressure (Exit)  102/64 mmHg      Heart Rate (Admit)  60 bpm      Heart Rate (Exercise)  98 bpm      Heart Rate (Exit)  78 bpm      Rating of Perceived Exertion (Exercise)  12      Frequency  Add 3 additional days to program exercise sessions.      Duration  Progress to 50 minutes of aerobic without signs/symptoms of physical distress      Intensity  Rest + 30      Progression  Continue progressive overload as per policy without signs/symptoms or physical distress.      Resistance Training   Training Prescription  Yes      Weight  3      Reps  10-12      Interval Training   Interval Training  No      Treadmill   MPH  3.4      Grade  1      Minutes  30      Elliptical   Level 3 2      Speed  3      Minutes  15         Discharge Exercise Prescription:   Nutrition:  Target Goals: Understanding of nutrition guidelines, daily intake of sodium <1589m, cholesterol <2032m calories 30% from fat and 7% or less from saturated fats, daily to have 5 or more servings of fruits and vegetables.  Biometrics:     Pre Biometrics - 12/06/14 1201    Pre Biometrics   Height _0  (1.6 m)   Weight 110 lb (49.896 kg)   Waist Circumference 27.5 inches   Hip Circumference 34.5 inches   Waist to Hip Ratio 0.8 %   BMI (Calculated) 19.5       Nutrition Therapy Plan and Nutrition Goals:   Nutrition Discharge: Rate Your Plate Scores:   Nutrition Goals Re-Evaluation:     Nutrition  Goals Re-Evaluation      01/03/15 0934           Personal Goal #1 Re-Evaluation   Personal Goal #1 Michelina would prefer not to meet individually with the dietician. Chevy eats walnuts on her  cereal. She also does not like Ensure to put back on her weight but eats a teaspoon of peanut butter periodically.       Goal Progress Seen Yes          Psychosocial: Target Goals: Acknowledge presence or absence of depression, maximize coping skills, provide positive support system. Participant is able to verbalize types and ability to use techniques and skills needed for reducing stress and depression.  Initial Review & Psychosocial Screening:     Initial Psych Review & Screening - 12/22/14 1008    Initial Review   Current issues with Current Sleep Concerns;Current Stress Concerns   Comments Ms. Starrett is the caregiver for her spouse who has cancer.   Family Dynamics   Good Support System? Yes   Barriers   Psychosocial barriers to participate in program There are no identifiable barriers or psychosocial needs.;The patient should benefit from training in stress management and relaxation.   Screening Interventions   Interventions Encouraged to exercise      Quality of Life Scores:     Quality of Life - 12/06/14 1205    Quality of Life Scores   Health/Function Pre 18.87 %   Socioeconomic Pre 25.3 %   Psych/Spiritual Pre 23.57 %   Family Pre 23.5 %   GLOBAL Pre 21.63 %      PHQ-9:     Recent Review Flowsheet Data    There is no flowsheet data to display.      Psychosocial Evaluation and Intervention:     Psychosocial Evaluation - 12/22/14 1010    Psychosocial Evaluation & Interventions   Interventions Relaxation education;Stress management education;Encouraged to exercise with the program and follow exercise prescription   Comments Counselor met with Ms. Wortley today for initial psychosocial evaluation.  She is a 74 year old well-adjusted female who had a heart attack while on vacation in March of this year.  She has a good support system with a spouse and close friends and is involved in her faith community.  Ms. Zullo denies a history of depression or current symptoms,  but has a great deal of stress in her life.  Her spouse has multiple health issues with lung and pancreatic cancer, and Ms. Kates is the primary caregiver for him.  She also just lost her mother in the past year and this was a significant loss to her.  Ms. Hoar has been a person who exercises consistently, but she does recognize the need to learn new ways to cope with stress and learn to relax.  She has some difficulty sleeping and has a limited appetite. Counselor shared some natural options for sleep to consider with her pharmacist.   Counselor discussed and practiced breathing exercises and meditation with Ms. Driskill and will continue to follow with her about this as needed.   Continued Psychosocial Services Needed --  Ms. Momon will benefit from all of the psychoeducational components of this program, especially stress management and learning new ways to relax.        Psychosocial Re-Evaluation:   Vocational Rehabilitation: Provide vocational rehab assistance to qualifying candidates.   Vocational Rehab Evaluation & Intervention:     Vocational Rehab - 01/02/15 1200    Initial Vocational Rehab  Evaluation & Intervention   Assessment shows need for Vocational Rehabilitation No      Education: Education Goals: Education classes will be provided on a weekly basis, covering required topics. Participant will state understanding/return demonstration of topics presented.  Learning Barriers/Preferences:     Learning Barriers/Preferences - 12/06/14 1159    Learning Barriers/Preferences   Learning Barriers None   Learning Preferences Group Instruction;Written Material;Video      Education Topics: General Nutrition Guidelines/Fats and Fiber: -Group instruction provided by verbal, written material, models and posters to present the general guidelines for heart healthy nutrition. Gives an explanation and review of dietary fats and fiber.   Controlling Sodium/Reading Food Labels: -Group  verbal and written material supporting the discussion of sodium use in heart healthy nutrition. Review and explanation with models, verbal and written materials for utilization of the food label.   Exercise Physiology & Risk Factors: - Group verbal and written instruction with models to review the exercise physiology of the cardiovascular system and associated critical values. Details cardiovascular disease risk factors and the goals associated with each risk factor.   Aerobic Exercise & Resistance Training: - Gives group verbal and written discussion on the health impact of inactivity. On the components of aerobic and resistive training programs and the benefits of this training and how to safely progress through these programs.   Flexibility, Balance, General Exercise Guidelines: - Provides group verbal and written instruction on the benefits of flexibility and balance training programs. Provides general exercise guidelines with specific guidelines to those with heart or lung disease. Demonstration and skill practice provided.   Stress Management: - Provides group verbal and written instruction about the health risks of elevated stress, cause of high stress, and healthy ways to reduce stress.   Depression: - Provides group verbal and written instruction on the correlation between heart/lung disease and depressed mood, treatment options, and the stigmas associated with seeking treatment.   Anatomy & Physiology of the Heart: - Group verbal and written instruction and models provide basic cardiac anatomy and physiology, with the coronary electrical and arterial systems. Review of: AMI, Angina, Valve disease, Heart Failure, Cardiac Arrhythmia, Pacemakers, and the ICD.   Cardiac Procedures: - Group verbal and written instruction and models to describe the testing methods done to diagnose heart disease. Reviews the outcomes of the test results. Describes the treatment choices: Medical  Management, Angioplasty, or Coronary Bypass Surgery.   Cardiac Medications: - Group verbal and written instruction to review commonly prescribed medications for heart disease. Reviews the medication, class of the drug, and side effects. Includes the steps to properly store meds and maintain the prescription regimen.   Go Sex-Intimacy & Heart Disease, Get SMART - Goal Setting: - Group verbal and written instruction through game format to discuss heart disease and the return to sexual intimacy. Provides group verbal and written material to discuss and apply goal setting through the application of the S.M.A.R.T. Method.   Other Matters of the Heart: - Provides group verbal, written materials and models to describe Heart Failure, Angina, Valve Disease, and Diabetes in the realm of heart disease. Includes description of the disease process and treatment options available to the cardiac patient.   Exercise & Equipment Safety: - Individual verbal instruction and demonstration of equipment use and safety with use of the equipment.   Infection Prevention: - Provides verbal and written material to individual with discussion of infection control including proper hand washing and proper equipment cleaning during exercise session.   Falls  Prevention: - Provides verbal and written material to individual with discussion of falls prevention and safety.   Diabetes: - Individual verbal and written instruction to review signs/symptoms of diabetes, desired ranges of glucose level fasting, after meals and with exercise. Advice that pre and post exercise glucose checks will be done for 3 sessions at entry of program.    Knowledge Questionnaire Score:     Knowledge Questionnaire Score - 12/06/14 1159    Knowledge Questionnaire Score   Pre Score 19/28      Personal Goals and Risk Factors at Admission:     Personal Goals and Risk Factors at Admission - 01/02/15 1202    Personal Goals and Risk  Factors on Admission    Weight Management Yes   Intervention Learn and follow the exercise and diet guidelines while in the program. Utilize the nutrition and education classes to help gain knowledge of the diet and exercise expectations in the program   Increase Aerobic Exercise and Physical Activity Yes   Intervention While in program, learn and follow the exercise prescription taught. Start at a low level workload and increase workload after able to maintain previous level for 30 minutes. Increase time before increasing intensity.      Personal Goals and Risk Factors Review:      Goals and Risk Factor Review      01/03/15 0935           Increase Aerobic Exercise and Physical Activity   Goals Progress/Improvement seen  Yes       Comments "I feel so much better since I got back in this gym". "Cardiac Rehab has helped me." Is on Elliptical for 10-15 minutes!          Personal Goals Discharge:     Comments: 30 day review

## 2015-01-05 DIAGNOSIS — I214 Non-ST elevation (NSTEMI) myocardial infarction: Secondary | ICD-10-CM

## 2015-01-05 NOTE — Addendum Note (Signed)
Addended by: Lynford Humphrey on: 01/05/2015 11:38 AM   Modules accepted: Orders

## 2015-01-05 NOTE — Progress Notes (Signed)
Daily Session Note  Patient Details  Name: Debra Moody MRN: [id number] Date of Birth: 01/16/1941 Referring Provider:  Rusty Aus, MD  Encounter Date: 01/05/2015  Check In:     Session Check In - 01/05/15 7409    Check-In   Staff Present Heath Lark RN, BSN, CCRP;Sharnika Binney BS, ACSM EP-C, Exercise Physiologist;Renee Dillard Essex MS, ACSM CEP Exercise Physiologist   ER physicians immediately available to respond to emergencies See telemetry face sheet for immediately available ER MD   Medication changes reported     No   Fall or balance concerns reported    No   Warm-up and Cool-down Performed on first and last piece of equipment   VAD Patient? No   Pain Assessment   Currently in Pain? No/denies         Goals Met:  Proper associated with RPD/PD & O2 Sat Exercise tolerated well No report of cardiac concerns or symptoms Strength training completed today  Goals Unmet:  Not Applicable  Goals Comments:   Dr. Emily Filbert is Medical Director for [facility] and [facility].

## 2015-01-07 ENCOUNTER — Encounter: Payer: Medicare Other | Admitting: *Deleted

## 2015-01-07 DIAGNOSIS — I214 Non-ST elevation (NSTEMI) myocardial infarction: Secondary | ICD-10-CM

## 2015-01-07 NOTE — Progress Notes (Signed)
Daily Session Note  Patient Details  Name: Debra Moody MRN: [id number] Date of Birth: 08-Dec-1940 Referring Provider:  Rusty Aus, MD  Encounter Date: 01/07/2015  Check In:     Session Check In - 01/07/15 0901    Check-In   Staff Present Heath Lark RN, BSN, CCRP;Carroll Enterkin RN, Drusilla Kanner MS, ACSM CEP Exercise Physiologist   ER physicians immediately available to respond to emergencies See telemetry face sheet for immediately available ER MD   Medication changes reported     No   Fall or balance concerns reported    No   Warm-up and Cool-down Performed on first and last piece of equipment   VAD Patient? No   Pain Assessment   Currently in Pain? No/denies         Goals Met:  Independence with exercise equipment Exercise tolerated well No report of cardiac concerns or symptoms Strength training completed today  Goals Unmet:  Not Applicable  Goals Comments:     Dr. Emily Filbert is Medical Director for [place] and [facility].

## 2015-01-12 ENCOUNTER — Encounter: Payer: Medicare Other | Attending: Internal Medicine | Admitting: *Deleted

## 2015-01-12 DIAGNOSIS — I214 Non-ST elevation (NSTEMI) myocardial infarction: Secondary | ICD-10-CM | POA: Diagnosis present

## 2015-01-12 NOTE — Progress Notes (Signed)
Daily Session Note  Patient Details  Name: Debra Moody MRN: [id number] Date of Birth: 05-10-1941 Referring Provider:  Rusty Aus, MD  Encounter Date: 01/12/2015  Check In:     Session Check In - 01/12/15 0821    Check-In   Staff Present Heath Lark RN, BSN, CCRP;Andrea Ferrer Dillard Essex MS, ACSM CEP Exercise Physiologist;Steven Way BS, ACSM EP-C, Exercise Physiologist   ER physicians immediately available to respond to emergencies See telemetry face sheet for immediately available ER MD   Medication changes reported     No   Fall or balance concerns reported    No   Warm-up and Cool-down Performed on first and last piece of equipment   VAD Patient? No   Pain Assessment   Currently in Pain? No/denies   Multiple Pain Sites No         Goals Met:  Independence with exercise equipment Exercise tolerated well Personal goals reviewed No report of cardiac concerns or symptoms Strength training completed today  Goals Unmet:  Not Applicable  Goals Comments:    Dr. Emily Filbert is Medical Director for [place] [facility].

## 2015-01-14 ENCOUNTER — Encounter: Payer: Medicare Other | Admitting: *Deleted

## 2015-01-14 DIAGNOSIS — I214 Non-ST elevation (NSTEMI) myocardial infarction: Secondary | ICD-10-CM

## 2015-01-14 NOTE — Progress Notes (Signed)
Daily Session Note  Patient Details  Name: LORRAYNE ISMAEL MRN: [id number] Date of Birth: [birth year] Referring Provider:  Rusty Aus, MD  Encounter Date: 01/14/2015  Check In:     Session Check In - 01/14/15 0915    Check-In   Staff Present Heath Lark RN, BSN, CCRP;Carroll Enterkin RN, Drusilla Kanner MS, ACSM CEP Exercise Physiologist   ER physicians immediately available to respond to emergencies See telemetry face sheet for immediately available ER MD   Medication changes reported     No   Fall or balance concerns reported    No   Warm-up and Cool-down Performed on first and last piece of equipment   VAD Patient? No   Pain Assessment   Currently in Pain? No/denies   Multiple Pain Sites No         Goals Met:  Independence with exercise equipment Changing diet to healthy choices, watchign portion sizes No report of cardiac concerns or symptoms Strength training completed today  Goals Unmet:  Not Applicable  Goals Comments:    Dr. Emily Filbert is Medical Director for [place] and [facility].

## 2015-01-17 ENCOUNTER — Encounter: Payer: Medicare Other | Admitting: *Deleted

## 2015-01-17 DIAGNOSIS — I214 Non-ST elevation (NSTEMI) myocardial infarction: Secondary | ICD-10-CM | POA: Diagnosis not present

## 2015-01-17 NOTE — Progress Notes (Signed)
Daily Session Note  Patient Details  Name: Debra Moody MRN: [id number] Date of Birth: 23-Jan-1941 Referring Provider:  Rusty Aus, MD  Encounter Date: 01/17/2015  Check In:     Session Check In - 01/17/15 0834    Check-In   Staff Present Earlean Shawl BS, ACSM CEP Exercise Physiologist;Susanne Bice RN, BSN, CCRP;Renee Dillard Essex MS, ACSM CEP Exercise Physiologist   ER physicians immediately available to respond to emergencies See telemetry face sheet for immediately available ER MD   Medication changes reported     No   Fall or balance concerns reported    No   Warm-up and Cool-down Performed on first and last piece of equipment   VAD Patient? No   Pain Assessment   Currently in Pain? No/denies   Multiple Pain Sites No         Goals Met:  Independence with exercise equipment Exercise tolerated well No report of cardiac concerns or symptoms  Goals Unmet:  Not Applicable  Goals Comments:    Dr. Emily Filbert is Medical Director for [place] [facility].

## 2015-01-19 DIAGNOSIS — I214 Non-ST elevation (NSTEMI) myocardial infarction: Secondary | ICD-10-CM | POA: Diagnosis not present

## 2015-01-19 NOTE — Progress Notes (Signed)
Daily Session Note  Patient Details  Name: GAVINA DILDINE MRN: [id number] Date of Birth: [birth year] Referring Provider:  Rusty Aus, MD  Encounter Date: 01/19/2015  Check In:     Session Check In - 01/19/15 0910    Check-In   Staff Present Heath Lark RN, BSN, CCRP;Juanelle Trueheart BS, ACSM EP-C, Exercise Physiologist;Renee Dillard Essex MS, ACSM CEP Exercise Physiologist   ER physicians immediately available to respond to emergencies See telemetry face sheet for immediately available ER MD   Medication changes reported     No   Fall or balance concerns reported    No   Warm-up and Cool-down Performed on first and last piece of equipment   VAD Patient? No   Pain Assessment   Currently in Pain? No/denies           Exercise Prescription Changes - 01/19/15 0900    Exercise Review   Progression Yes   Elliptical   Level 2   Speed 4      Goals Met:  Proper associated with RPD/PD & O2 Sat Exercise tolerated well No report of cardiac concerns or symptoms Strength training completed today  Goals Unmet:  Not Applicable  Goals Comments:    Dr. Emily Filbert is Medical Director for [place] [facility].

## 2015-01-21 ENCOUNTER — Encounter: Payer: Medicare Other | Admitting: *Deleted

## 2015-01-21 DIAGNOSIS — I214 Non-ST elevation (NSTEMI) myocardial infarction: Secondary | ICD-10-CM

## 2015-01-21 NOTE — Progress Notes (Signed)
Daily Session Note  Patient Details  Name: IZORA BENN MRN: [id number] Date of Birth: 04/06/1941 Referring Provider:  Rusty Aus, MD  Encounter Date: 01/21/2015  Check In:     Session Check In - 01/21/15 0903    Check-In   Staff Present Nyoka Cowden RN;Renee Dellwood MS, ACSM CEP Exercise Physiologist;Anahla Bevis RN, BSN   ER physicians immediately available to respond to emergencies See telemetry face sheet for immediately available ER MD   Medication changes reported     No   Fall or balance concerns reported    No   Warm-up and Cool-down Performed on first and last piece of equipment   VAD Patient? No   Pain Assessment   Currently in Pain? No/denies         Goals Met:  Proper associated with RPD/PD & O2 Sat Exercise tolerated well  Goals Unmet:  Not Applicable  Goals Comments:    Dr. Emily Filbert is Medical Director for [place] [facility].

## 2015-01-24 DIAGNOSIS — I214 Non-ST elevation (NSTEMI) myocardial infarction: Secondary | ICD-10-CM | POA: Diagnosis not present

## 2015-01-24 NOTE — Progress Notes (Signed)
Daily Session Note  Patient Details  Name: MEGYN LENG MRN: [id number] Date of Birth: [birth year] Referring Provider:  Rusty Aus, MD  Encounter Date: 01/24/2015  Check In:     Session Check In - 01/24/15 1612    Check-In   Staff Present Heath Lark RN, BSN, CCRP;Ayrton Mcvay BS, ACSM EP-C, Exercise Physiologist;Carroll Radio producer, BSN   ER physicians immediately available to respond to emergencies See telemetry face sheet for immediately available ER MD   Medication changes reported     No   Fall or balance concerns reported    No   Warm-up and Cool-down Performed on first and last piece of equipment   VAD Patient? No   Pain Assessment   Currently in Pain? No/denies         Goals Met:  Proper associated with RPD/PD & O2 Sat Exercise tolerated well No report of cardiac concerns or symptoms Strength training completed today  Goals Unmet:  Not Applicable  Goals Comments:    Dr. Emily Filbert is Medical Director for [place] [facility].

## 2015-01-24 NOTE — Progress Notes (Signed)
Daily Session Note  Patient Details  Name: Debra Moody MRN: [id number] Date of Birth: [birth year] Referring Provider:  Rusty Aus, MD  Encounter Date: 01/24/2015  Check In:     Session Check In - 01/24/15 1612    Check-In   Staff Present Heath Lark RN, BSN, CCRP;Steven Way BS, ACSM EP-C, Exercise Physiologist;Carroll Radio producer, BSN   ER physicians immediately available to respond to emergencies See telemetry face sheet for immediately available ER MD   Medication changes reported     No   Fall or balance concerns reported    No   Warm-up and Cool-down Performed on first and last piece of equipment   VAD Patient? No   Pain Assessment   Currently in Pain? No/denies         Goals Met:  Independence with exercise equipment Exercise tolerated well Personal goals reviewed No report of cardiac concerns or symptoms  Goals Unmet:  Not Applicable  Goals Comments:    Dr. Emily Filbert is Medical Director for [place] and [facility].

## 2015-01-26 ENCOUNTER — Telehealth: Payer: Self-pay | Admitting: *Deleted

## 2015-01-26 NOTE — Telephone Encounter (Signed)
Debra Moody called to say she will miss class because of Root Canal being done.

## 2015-01-28 ENCOUNTER — Encounter: Payer: Medicare Other | Admitting: *Deleted

## 2015-01-28 DIAGNOSIS — I214 Non-ST elevation (NSTEMI) myocardial infarction: Secondary | ICD-10-CM

## 2015-01-28 NOTE — Progress Notes (Signed)
Daily Session Note  Patient Details  Name: Debra Moody MRN: [id number] Date of Birth: 1941-01-14 Referring Provider:  Rusty Aus, MD  Encounter Date: 01/28/2015  Check In:     Session Check In - 01/28/15 0900    Check-In   Staff Present Heath Lark RN, BSN, CCRP;Carroll Enterkin RN, Drusilla Kanner MS, ACSM CEP Exercise Physiologist   ER physicians immediately available to respond to emergencies See telemetry face sheet for immediately available ER MD   Medication changes reported     No   Fall or balance concerns reported    No   Warm-up and Cool-down Performed on first and last piece of equipment   VAD Patient? No   Pain Assessment   Currently in Pain? No/denies   Multiple Pain Sites No         Goals Met:  Independence with exercise equipment Exercise tolerated well Personal goals reviewed No report of cardiac concerns or symptoms  Goals Unmet:  Not Applicable  Goals Comments: [name] has a root canal on 01/26/15 and was instructed to drop her workloads today to allow her mouth to heal. She was still a little sore in the mouth from the procedure.    Dr. Emily Filbert is Medical Director for [place] and [facility].

## 2015-01-30 ENCOUNTER — Encounter: Payer: Self-pay | Admitting: *Deleted

## 2015-01-30 DIAGNOSIS — I214 Non-ST elevation (NSTEMI) myocardial infarction: Secondary | ICD-10-CM

## 2015-01-31 ENCOUNTER — Encounter: Payer: Self-pay | Admitting: *Deleted

## 2015-01-31 ENCOUNTER — Encounter: Payer: Medicare Other | Admitting: *Deleted

## 2015-01-31 DIAGNOSIS — I214 Non-ST elevation (NSTEMI) myocardial infarction: Secondary | ICD-10-CM | POA: Diagnosis not present

## 2015-01-31 NOTE — Progress Notes (Signed)
Daily Session Note  Patient Details  Name: Debra Moody MRN: [id number] Date of Birth: 11-04-1940 Referring Provider:  Rusty Aus, MD  Encounter Date: 01/31/2015  Check In:     Session Check In - 01/31/15 0817    Check-In   Staff Present Heath Lark RN, BSN, CCRP;Carroll Enterkin RN, BSN;Everette Dimauro Amedeo Plenty BS, ACSM CEP Exercise Physiologist   ER physicians immediately available to respond to emergencies See telemetry face sheet for immediately available ER MD   Medication changes reported     Yes   Comments Amoxicillin for one week due to tooth absess    Fall or balance concerns reported    No   Warm-up and Cool-down Performed on first and last piece of equipment   VAD Patient? No   Pain Assessment   Currently in Pain? No/denies   Multiple Pain Sites No         Goals Met:  Independence with exercise equipment Exercise tolerated well No report of cardiac concerns or symptoms Strength training completed today  Goals Unmet:  Not Applicable  Goals Comments:    Dr. Emily Filbert is Medical Director for [place] and [facility].

## 2015-02-01 ENCOUNTER — Encounter: Payer: Self-pay | Admitting: *Deleted

## 2015-02-01 DIAGNOSIS — I214 Non-ST elevation (NSTEMI) myocardial infarction: Secondary | ICD-10-CM

## 2015-02-01 NOTE — Progress Notes (Signed)
Cardiac Individual Treatment Plan  Patient Details  Name: Debra Moody MRN: [id number] Date of Birth: 1941-01-11 Referring Provider: Dr. Loleta Chance Initial Encounter Date:  12/09/2014  Visit Diagnosis: Acute non-ST-elevation myocardial infarction  Patient's Home Medications on Admission:  Current outpatient prescriptions:  .  ALPRAZolam (XANAX) 0.25 MG tablet, Take by mouth., Disp: , Rfl:  .  aspirin 81 MG tablet, Take 81 mg by mouth daily., Disp: , Rfl:  .  Biotin 1000 MCG tablet, Take 1,000 mcg by mouth daily., Disp: , Rfl:  .  Calcium-Magnesium-Vitamin D (CALCIUM 500 PO), Take 1,000 mg by mouth daily., Disp: , Rfl:  .  carvedilol (COREG) 3.125 MG tablet, Take 1 tablet (3.125 mg total) by mouth 2 (two) times daily with a meal., Disp: 180 tablet, Rfl: 3 .  cetirizine (ZYRTEC) 10 MG tablet, Take 10 mg by mouth daily., Disp: , Rfl:  .  Cetirizine HCl 10 MG CAPS, Take 10 mg by mouth daily. , Disp: , Rfl:  .  Coenzyme Q10 (COQ10) 100 MG CAPS, Take 100 mg by mouth daily., Disp: , Rfl:  .  diphenhydrAMINE (BENADRYL) 25 mg capsule, Take 25 mg by mouth every 6 (six) hours as needed. , Disp: , Rfl:  .  ezetimibe (ZETIA) 10 MG tablet, Take 10 mg by mouth daily., Disp: , Rfl:  .  fexofenadine (ALLEGRA) 180 MG tablet, Take by mouth., Disp: , Rfl:  .  hyoscyamine (LEVSIN, ANASPAZ) 0.125 MG tablet, Take by mouth., Disp: , Rfl:  .  lisinopril (PRINIVIL,ZESTRIL) 2.5 MG tablet, Take 1 tablet (2.5 mg total) by mouth daily., Disp: 90 tablet, Rfl: 3 .  magnesium oxide (MAG-OX) 400 MG tablet, Take 400 mg by mouth daily., Disp: , Rfl:  .  Melatonin 5 MG TABS, Take by mouth., Disp: , Rfl:  .  Melatonin 5 MG TBDP, Take 5 mg by mouth as needed., Disp: , Rfl:  .  Omega-3 Fatty Acids (FISH OIL) 1000 MG CAPS, Take 1,200 mg by mouth daily. , Disp: , Rfl:  .  pantoprazole (PROTONIX) 40 MG tablet, Take 40 mg by mouth 2 (two) times daily., Disp: , Rfl:  .  Phenylephrine-Acetaminophen 5-325 MG TABS, Take by mouth as  needed. , Disp: , Rfl:  .  Polyethylene Glycol 3350 (MIRALAX PO), Take by mouth every evening., Disp: , Rfl:  .  Probiotic Product (PROBIOTIC DAILY PO), Take by mouth daily., Disp: , Rfl:  .  ranitidine (ZANTAC) 75 MG tablet, Take 150 mg by mouth 2 (two) times daily., Disp: , Rfl:  .  Simethicone 125 MG TABS, Take by mouth as needed. , Disp: , Rfl:  .  Vitamin D, Cholecalciferol, 400 UNITS CAPS, Take by mouth daily., Disp: , Rfl:   Past Medical History: Past Medical History  Diagnosis Date  . Osteoporosis   . Hypertension   . History of shingles 2015  . Diverticulitis   . 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 Skin cancer, basal cell   . GERD (gastroesophageal reflux disease)   . Hyperlipidemia     Tobacco Use: History  Smoking status  . Former Smoker -- 18 years  . Types: Cigarettes  . Quit date: 12/01/1999  Smokeless tobacco  . Not on file    Labs: Recent Review Flowsheet Data    There is no flowsheet data to display.       Exercise Target Goals:    Exercise Program Goal: Individual exercise prescription set  with THRR, safety & activity barriers. Participant demonstrates ability to understand and report RPE using BORG scale, to self-measure pulse accurately, and to acknowledge the importance of the exercise prescription.  Exercise Prescription Goal: Starting with aerobic activity 30 plus minutes a day, 3 days per week for initial exercise prescription. Provide home exercise prescription and guidelines that participant acknowledges understanding prior to discharge.  Activity Barriers & Risk Stratification:     Activity Barriers & Risk Stratification - 01/02/15 1158    Activity Barriers & Risk Stratification   Activity Barriers History of Falls   Risk Stratification High      6 Minute Walk:     6 Minute Walk      12/06/14 1200       6 Minute Walk   Phase Initial     Distance 1910 feet     Walk Time 6 minutes      Resting HR 66 bpm     Resting BP 122/70 mmHg     Max Ex. HR 130 bpm     Max Ex. BP 152/58 mmHg     RPE 13     Symptoms No        Initial Exercise Prescription:   Exercise Prescription Changes:     Exercise Prescription Changes      12/29/14 0800 01/04/15 0800 01/12/15 0800 01/19/15 0900 01/31/15 1630   Exercise Review   Progression Yes Yes  Yes Yes   Response to Exercise   Blood Pressure (Admit)  102/64 mmHg   130/80 mmHg   Blood Pressure (Exercise)  124/60 mmHg   150/78 mmHg   Blood Pressure (Exit)  102/64 mmHg   94/60 mmHg   Heart Rate (Admit)  60 bpm   98 bpm   Heart Rate (Exercise)  98 bpm   122 bpm   Heart Rate (Exit)  78 bpm   90 bpm   Rating of Perceived Exertion (Exercise)  12   13   Frequency  Add 3 additional days to program exercise sessions.   Add 3 additional days to program exercise sessions.   Duration  Progress to 50 minutes of aerobic without signs/symptoms of physical distress   Progress to 50 minutes of aerobic without signs/symptoms of physical distress   Intensity  Rest + 30   Rest + 30   Progression  Continue progressive overload as per policy without signs/symptoms or physical distress.   Continue progressive overload as per policy without signs/symptoms or physical distress.   Resistance Training   Training Prescription  Yes   Yes   Weight  3   3   Reps  10-12   10-15   Interval Training   Interval Training  No Yes  Yes   Equipment   Treadmill  Treadmill   Comments   3.5/2%  3.7/2%   Treadmill   MPH  3.4   3.7   Grade  1   3   Minutes  30   30   Elliptical   Level $Remo'3 2  2 2   'euUej$ Speed  $Remo'3  4 4   'YGeNJ$ Minutes  15   15   REL-XR   Level     7   Watts     90   Minutes     20      Discharge Exercise Prescription (Final Exercise Prescription Changes):     Exercise Prescription Changes - 01/31/15 1630    Exercise Review   Progression Yes   Response  to Exercise   Blood Pressure (Admit) 130/80 mmHg   Blood Pressure (Exercise) 150/78 mmHg   Blood  Pressure (Exit) 94/60 mmHg   Heart Rate (Admit) 98 bpm   Heart Rate (Exercise) 122 bpm   Heart Rate (Exit) 90 bpm   Rating of Perceived Exertion (Exercise) 13   Frequency Add 3 additional days to program exercise sessions.   Duration Progress to 50 minutes of aerobic without signs/symptoms of physical distress   Intensity Rest + 30   Progression Continue progressive overload as per policy without signs/symptoms or physical distress.   Resistance Training   Training Prescription Yes   Weight 3   Reps 10-15   Interval Training   Interval Training Yes   Equipment Treadmill   Comments 3.7/2%   Treadmill   MPH 3.7   Grade 3   Minutes 30   Elliptical   Level 2   Speed 4   Minutes 15   REL-XR   Level 7   Watts 90   Minutes 20      Nutrition:  Target Goals: Understanding of nutrition guidelines, daily intake of sodium '1500mg'$ , cholesterol '200mg'$ , calories 30% from fat and 7% or less from saturated fats, daily to have 5 or more servings of fruits and vegetables.  Biometrics:     Pre Biometrics - 12/06/14 1201    Pre Biometrics   Height $Remov'5\' 3"'DueihR$  (1.6 m)   Weight 110 lb (49.896 kg)   Waist Circumference 27.5 inches   Hip Circumference 34.5 inches   Waist to Hip Ratio 0.8 %   BMI (Calculated) 19.5       Nutrition Therapy Plan and Nutrition Goals:   Nutrition Discharge: Rate Your Plate Scores:   Nutrition Goals Re-Evaluation:     Nutrition Goals Re-Evaluation      01/03/15 0934 01/24/15 1734         Personal Goal #1 Re-Evaluation   Personal Goal #1 Annaliesa would prefer not to meet individually with the dietician. Rasheedah eats walnuts on her cereal. She also does not like Ensure to put back on her weight but eats a teaspoon of peanut butter periodically. Dorethia would prefer not to meet individually with the dietician. Kazzandra eats walnuts on her cereal. She also does not like Ensure to put back on her weight but eats a teaspoon of peanut butter periodically.      Goal Progress  Seen Yes       Personal Goal #2 Re-Evaluation   Personal Goal #2  To meet with RD next week         Psychosocial: Target Goals: Acknowledge presence or absence of depression, maximize coping skills, provide positive support system. Participant is able to verbalize types and ability to use techniques and skills needed for reducing stress and depression.  Initial Review & Psychosocial Screening:     Initial Psych Review & Screening - 12/22/14 1008    Initial Review   Current issues with Current Sleep Concerns;Current Stress Concerns   Comments Ms. Cowman is the caregiver for her spouse who has cancer.   Family Dynamics   Good Support System? Yes   Barriers   Psychosocial barriers to participate in program There are no identifiable barriers or psychosocial needs.;The patient should benefit from training in stress management and relaxation.   Screening Interventions   Interventions Encouraged to exercise      Quality of Life Scores:     Quality of Life - 12/06/14 1205    Quality of Life  Scores   Health/Function Pre 18.87 %   Socioeconomic Pre 25.3 %   Psych/Spiritual Pre 23.57 %   Family Pre 23.5 %   GLOBAL Pre 21.63 %      PHQ-9:     Recent Review Flowsheet Data    There is no flowsheet data to display.      Psychosocial Evaluation and Intervention:     Psychosocial Evaluation - 12/22/14 1010    Psychosocial Evaluation & Interventions   Interventions Relaxation education;Stress management education;Encouraged to exercise with the program and follow exercise prescription   Comments Counselor met with Ms. Villegas today for initial psychosocial evaluation.  She is a 74 year old well-adjusted female who had a heart attack while on vacation in March of this year.  She has a good support system with a spouse and close friends and is involved in her faith community.  Ms. Selden denies a history of depression or current symptoms, but has a great deal of stress in her life.  Her  spouse has multiple health issues with lung and pancreatic cancer, and Ms. Vanvleck is the primary caregiver for him.  She also just lost her mother in the past year and this was a significant loss to her.  Ms. Gregg has been a person who exercises consistently, but she does recognize the need to learn new ways to cope with stress and learn to relax.  She has some difficulty sleeping and has a limited appetite. Counselor shared some natural options for sleep to consider with her pharmacist.   Counselor discussed and practiced breathing exercises and meditation with Ms. Stillion and will continue to follow with her about this as needed.   Continued Psychosocial Services Needed --  Ms. Gladd will benefit from all of the psychoeducational components of this program, especially stress management and learning new ways to relax.        Psychosocial Re-Evaluation:   Vocational Rehabilitation: Provide vocational rehab assistance to qualifying candidates.   Vocational Rehab Evaluation & Intervention:     Vocational Rehab - 01/30/15 0944    Initial Vocational Rehab Evaluation & Intervention   Assessment shows need for Vocational Rehabilitation No      Education: Education Goals: Education classes will be provided on a weekly basis, covering required topics. Participant will state understanding/return demonstration of topics presented.  Learning Barriers/Preferences:     Learning Barriers/Preferences - 12/06/14 1159    Learning Barriers/Preferences   Learning Barriers None   Learning Preferences Group Instruction;Written Material;Video      Education Topics: General Nutrition Guidelines/Fats and Fiber: -Group instruction provided by verbal, written material, models and posters to present the general guidelines for heart healthy nutrition. Gives an explanation and review of dietary fats and fiber.   Controlling Sodium/Reading Food Labels: -Group verbal and written material supporting the discussion  of sodium use in heart healthy nutrition. Review and explanation with models, verbal and written materials for utilization of the food label.   Exercise Physiology & Risk Factors: - Group verbal and written instruction with models to review the exercise physiology of the cardiovascular system and associated critical values. Details cardiovascular disease risk factors and the goals associated with each risk factor.   Aerobic Exercise & Resistance Training: - Gives group verbal and written discussion on the health impact of inactivity. On the components of aerobic and resistive training programs and the benefits of this training and how to safely progress through these programs.   Flexibility, Balance, General Exercise Guidelines: - Provides  group verbal and written instruction on the benefits of flexibility and balance training programs. Provides general exercise guidelines with specific guidelines to those with heart or lung disease. Demonstration and skill practice provided.   Stress Management: - Provides group verbal and written instruction about the health risks of elevated stress, cause of high stress, and healthy ways to reduce stress.   Depression: - Provides group verbal and written instruction on the correlation between heart/lung disease and depressed mood, treatment options, and the stigmas associated with seeking treatment.   Anatomy & Physiology of the Heart: - Group verbal and written instruction and models provide basic cardiac anatomy and physiology, with the coronary electrical and arterial systems. Review of: AMI, Angina, Valve disease, Heart Failure, Cardiac Arrhythmia, Pacemakers, and the ICD.   Cardiac Procedures: - Group verbal and written instruction and models to describe the testing methods done to diagnose heart disease. Reviews the outcomes of the test results. Describes the treatment choices: Medical Management, Angioplasty, or Coronary Bypass  Surgery.   Cardiac Medications: - Group verbal and written instruction to review commonly prescribed medications for heart disease. Reviews the medication, class of the drug, and side effects. Includes the steps to properly store meds and maintain the prescription regimen.   Go Sex-Intimacy & Heart Disease, Get SMART - Goal Setting: - Group verbal and written instruction through game format to discuss heart disease and the return to sexual intimacy. Provides group verbal and written material to discuss and apply goal setting through the application of the S.M.A.R.T. Method.   Other Matters of the Heart: - Provides group verbal, written materials and models to describe Heart Failure, Angina, Valve Disease, and Diabetes in the realm of heart disease. Includes description of the disease process and treatment options available to the cardiac patient.   Exercise & Equipment Safety: - Individual verbal instruction and demonstration of equipment use and safety with use of the equipment.   Infection Prevention: - Provides verbal and written material to individual with discussion of infection control including proper hand washing and proper equipment cleaning during exercise session.   Falls Prevention: - Provides verbal and written material to individual with discussion of falls prevention and safety.   Diabetes: - Individual verbal and written instruction to review signs/symptoms of diabetes, desired ranges of glucose level fasting, after meals and with exercise. Advice that pre and post exercise glucose checks will be done for 3 sessions at entry of program.    Knowledge Questionnaire Score:     Knowledge Questionnaire Score - 12/06/14 1159    Knowledge Questionnaire Score   Pre Score 19/28      Personal Goals and Risk Factors at Admission:     Personal Goals and Risk Factors at Admission - 01/02/15 1202    Personal Goals and Risk Factors on Admission    Weight Management Yes    Intervention Learn and follow the exercise and diet guidelines while in the program. Utilize the nutrition and education classes to help gain knowledge of the diet and exercise expectations in the program   Increase Aerobic Exercise and Physical Activity Yes   Intervention While in program, learn and follow the exercise prescription taught. Start at a low level workload and increase workload after able to maintain previous level for 30 minutes. Increase time before increasing intensity.      Personal Goals and Risk Factors Review:      Goals and Risk Factor Review      01/03/15 0935 01/14/15 1255 01/24/15 1735  01/30/15 0939     Weight Management   Goals Progress/Improvement seen   Yes     Comments   Maintaining weight     Increase Aerobic Exercise and Physical Activity   Goals Progress/Improvement seen  Yes Yes Yes Yes    Comments "I feel so much better since I got back in this gym". "Cardiac Rehab has helped me." Is on Elliptical for 10-15 minutes! Feels the most challenged on the elliptical and has seen some encouraging progression since starting rehab. She is currently walking on almost every day of the week, including days she attends rehab. She says exercise will remain a part of her lifestyle.          Personal Goals Discharge:     Comments: 30 day review. Continue with ITP.

## 2015-02-02 ENCOUNTER — Encounter: Payer: Medicare Other | Admitting: *Deleted

## 2015-02-02 ENCOUNTER — Other Ambulatory Visit: Payer: Self-pay | Admitting: *Deleted

## 2015-02-02 DIAGNOSIS — I214 Non-ST elevation (NSTEMI) myocardial infarction: Secondary | ICD-10-CM | POA: Diagnosis not present

## 2015-02-02 NOTE — Progress Notes (Signed)
Daily Session Note  Patient Details  Name: Debra Moody MRN: [id number] Date of Birth: [birth year] Referring Provider:  Rusty Aus, MD  Encounter Date: 02/02/2015  Check In:     Session Check In - 02/02/15 0905    Check-In   Staff Present Heath Lark RN, BSN, CCRP;Renee Dillard Essex MS, ACSM CEP Exercise Physiologist;Marcellas Marchant BS, ACSM EP-C, Exercise Physiologist   ER physicians immediately available to respond to emergencies See telemetry face sheet for immediately available ER MD   Medication changes reported     No   Fall or balance concerns reported    No   Warm-up and Cool-down Performed on first and last piece of equipment   VAD Patient? No   Pain Assessment   Currently in Pain? No/denies         Goals Met:  Proper associated with RPD/PD & O2 Sat Exercise tolerated well No report of cardiac concerns or symptoms Strength training completed today  Goals Unmet:  Not Applicable  Goals Comments:    Dr. Emily Filbert is Medical Director for [place] and [facility].

## 2015-02-03 ENCOUNTER — Other Ambulatory Visit: Payer: Self-pay | Admitting: Internal Medicine

## 2015-02-03 ENCOUNTER — Ambulatory Visit
Admission: RE | Admit: 2015-02-03 | Discharge: 2015-02-03 | Disposition: A | Discharge status: Home / Self Care | Payer: Medicare Other | Source: Ambulatory Visit | Attending: Internal Medicine | Admitting: Internal Medicine

## 2015-02-03 VITALS — Ht 63.0 in | Wt 108.3 lb

## 2015-02-03 DIAGNOSIS — Z1231 Encounter for screening mammogram for malignant neoplasm of breast: Secondary | ICD-10-CM

## 2015-02-03 DIAGNOSIS — I214 Non-ST elevation (NSTEMI) myocardial infarction: Secondary | ICD-10-CM | POA: Diagnosis not present

## 2015-02-03 NOTE — Progress Notes (Signed)
Daily Session Note  Patient Details  Name: Debra Moody MRN: [id number] Date of Birth: January 13, 1941 Referring Provider:  Rusty Aus, MD  Encounter Date: 02/03/2015  Check In:     Session Check In - 02/03/15 1629    Check-In   Staff Present Gerlene Burdock RN, BSN;Cleatus Goodin BS, ACSM EP-C, Exercise Physiologist;Diane Mariana Arn, BSN   ER physicians immediately available to respond to emergencies See telemetry face sheet for immediately available ER MD   Medication changes reported     No   Fall or balance concerns reported    No   Warm-up and Cool-down Performed on first and last piece of equipment   VAD Patient? No   Pain Assessment   Currently in Pain? No/denies         Goals Met:  Proper associated with RPD/PD & O2 Sat Exercise tolerated well No report of cardiac concerns or symptoms Strength training completed today  Goals Unmet:  Not Applicable  Goals Comments:    Dr. Emily Filbert is Medical Director for [place] and [facility].

## 2015-02-21 ENCOUNTER — Encounter: Payer: Self-pay | Admitting: *Deleted

## 2015-02-25 ENCOUNTER — Telehealth: Payer: Self-pay | Admitting: *Deleted

## 2015-02-25 ENCOUNTER — Encounter: Payer: Self-pay | Admitting: *Deleted

## 2015-02-25 DIAGNOSIS — I214 Non-ST elevation (NSTEMI) myocardial infarction: Secondary | ICD-10-CM

## 2015-02-25 NOTE — Telephone Encounter (Signed)
Hopes to return to Cardiac Rehab this Monday.

## 2015-02-25 NOTE — Progress Notes (Signed)
Cardiac Individual Treatment Plan  Patient Details  Name: Debra Moody MRN: [id number] Date of Birth: 09/15/1940 Referring Provider:  No ref. provider found  Initial Encounter Date:    Visit Diagnosis: Acute non-ST-elevation myocardial infarction  Patient's Home Medications on Admission:  Current outpatient prescriptions:  .  ALPRAZolam (XANAX) 0.25 MG tablet, Take by mouth., Disp: , Rfl:  .  aspirin 81 MG tablet, Take 81 mg by mouth daily., Disp: , Rfl:  .  Biotin 1000 MCG tablet, Take 1,000 mcg by mouth daily., Disp: , Rfl:  .  Calcium-Magnesium-Vitamin D (CALCIUM 500 PO), Take 1,000 mg by mouth daily., Disp: , Rfl:  .  carvedilol (COREG) 3.125 MG tablet, Take 1 tablet (3.125 mg total) by mouth 2 (two) times daily with a meal., Disp: 180 tablet, Rfl: 3 .  cetirizine (ZYRTEC) 10 MG tablet, Take 10 mg by mouth daily., Disp: , Rfl:  .  Cetirizine HCl 10 MG CAPS, Take 10 mg by mouth daily. , Disp: , Rfl:  .  Coenzyme Q10 (COQ10) 100 MG CAPS, Take 100 mg by mouth daily., Disp: , Rfl:  .  diphenhydrAMINE (BENADRYL) 25 mg capsule, Take 25 mg by mouth every 6 (six) hours as needed. , Disp: , Rfl:  .  ezetimibe (ZETIA) 10 MG tablet, Take 10 mg by mouth daily., Disp: , Rfl:  .  fexofenadine (ALLEGRA) 180 MG tablet, Take by mouth., Disp: , Rfl:  .  hyoscyamine (LEVSIN, ANASPAZ) 0.125 MG tablet, Take by mouth., Disp: , Rfl:  .  lisinopril (PRINIVIL,ZESTRIL) 2.5 MG tablet, Take 1 tablet (2.5 mg total) by mouth daily., Disp: 90 tablet, Rfl: 3 .  magnesium oxide (MAG-OX) 400 MG tablet, Take 400 mg by mouth daily., Disp: , Rfl:  .  Melatonin 5 MG TABS, Take by mouth., Disp: , Rfl:  .  Melatonin 5 MG TBDP, Take 5 mg by mouth as needed., Disp: , Rfl:  .  Omega-3 Fatty Acids (FISH OIL) 1000 MG CAPS, Take 1,200 mg by mouth daily. , Disp: , Rfl:  .  pantoprazole (PROTONIX) 40 MG tablet, Take 40 mg by mouth 2 (two) times daily., Disp: , Rfl:  .  Phenylephrine-Acetaminophen 5-325 MG TABS, Take by mouth as  needed. , Disp: , Rfl:  .  Polyethylene Glycol 3350 (MIRALAX PO), Take by mouth every evening., Disp: , Rfl:  .  Probiotic Product (PROBIOTIC DAILY PO), Take by mouth daily., Disp: , Rfl:  .  ranitidine (ZANTAC) 75 MG tablet, Take 150 mg by mouth 2 (two) times daily., Disp: , Rfl:  .  Simethicone 125 MG TABS, Take by mouth as needed. , Disp: , Rfl:  .  Vitamin D, Cholecalciferol, 400 UNITS CAPS, Take by mouth daily., Disp: , Rfl:   Past Medical History: Past Medical History  Diagnosis Date  . Osteoporosis   . Hypertension   . History of shingles 2015  . Diverticulitis   . 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 GERD (gastroesophageal reflux disease)   . Hyperlipidemia   . Skin cancer, basal cell     Tobacco Use: History  Smoking status  . Former Smoker -- 18 years  . Types: Cigarettes  . Quit date: 12/01/1999  Smokeless tobacco  . Not on file    Labs: Recent Review Flowsheet Data    There is no flowsheet data to display.       Exercise Target Goals:    Exercise Program Goal: Individual  exercise prescription set with THRR, safety & activity barriers. Participant demonstrates ability to understand and report RPE using BORG scale, to self-measure pulse accurately, and to acknowledge the importance of the exercise prescription.  Exercise Prescription Goal: Starting with aerobic activity 30 plus minutes a day, 3 days per week for initial exercise prescription. Provide home exercise prescription and guidelines that participant acknowledges understanding prior to discharge.  Activity Barriers & Risk Stratification:     Activity Barriers & Risk Stratification - 01/02/15 1158    Activity Barriers & Risk Stratification   Activity Barriers History of Falls   Risk Stratification High      6 Minute Walk:     6 Minute Walk      12/06/14 1200 02/03/15 1630     6 Minute Walk   Phase Initial Discharge    Distance 1910 feet 2200  feet    Walk Time 6 minutes 6 minutes    Resting HR 66 bpm 70 bpm    Resting BP 122/70 mmHg 144/80 mmHg    Max Ex. HR 130 bpm 99 bpm    Max Ex. BP 152/58 mmHg 212/64 mmHg    RPE 13 13    Perceived Dyspnea   2    Symptoms No No       Initial Exercise Prescription:   Exercise Prescription Changes:     Exercise Prescription Changes      12/29/14 0800 01/04/15 0800 01/12/15 0800 01/19/15 0900 01/31/15 1630   Exercise Review   Progression Yes Yes  Yes Yes   Response to Exercise   Blood Pressure (Admit)  102/64 mmHg   130/80 mmHg   Blood Pressure (Exercise)  124/60 mmHg   150/78 mmHg   Blood Pressure (Exit)  102/64 mmHg   94/60 mmHg   Heart Rate (Admit)  60 bpm   98 bpm   Heart Rate (Exercise)  98 bpm   122 bpm   Heart Rate (Exit)  78 bpm   90 bpm   Rating of Perceived Exertion (Exercise)  12   13   Frequency  Add 3 additional days to program exercise sessions.   Add 3 additional days to program exercise sessions.   Duration  Progress to 50 minutes of aerobic without signs/symptoms of physical distress   Progress to 50 minutes of aerobic without signs/symptoms of physical distress   Intensity  Rest + 30   Rest + 30   Progression  Continue progressive overload as per policy without signs/symptoms or physical distress.   Continue progressive overload as per policy without signs/symptoms or physical distress.   Resistance Training   Training Prescription  Yes   Yes   Weight  3   3   Reps  10-12   10-15   Interval Training   Interval Training  No Yes  Yes   Equipment   Treadmill  Treadmill   Comments   3.5/2%  3.7/2%   Treadmill   MPH  3.4   3.7   Grade  1   3   Minutes  30   30   Elliptical   Level $Remo'3 2  2 2   'Tkajh$ Speed  $Remo'3  4 4   'tQSKG$ Minutes  15   15   REL-XR   Level     7   Watts     90   Minutes     20     02/21/15 0700           Exercise  Review   Progression Yes       Response to Exercise   Blood Pressure (Admit) 144/80 mmHg       Blood Pressure (Exercise) 212/64 mmHg        Blood Pressure (Exit) 116/74 mmHg       Heart Rate (Admit) 70 bpm       Heart Rate (Exercise) 126 bpm       Heart Rate (Exit) 88 bpm       Rating of Perceived Exertion (Exercise) 13       Frequency Add 3 additional days to program exercise sessions.       Duration Progress to 50 minutes of aerobic without signs/symptoms of physical distress       Intensity Rest + 30       Progression Continue progressive overload as per policy without signs/symptoms or physical distress.       Resistance Training   Training Prescription Yes       Weight 3       Reps 10-15       Interval Training   Interval Training Yes       Equipment Treadmill       Comments 3.7/4%       Treadmill   MPH 3.7       Grade 4       Minutes 30       Elliptical   Level 2       Speed 4       Minutes 15       REL-XR   Level 7       Watts 100       Minutes 20          Discharge Exercise Prescription (Final Exercise Prescription Changes):     Exercise Prescription Changes - 02/21/15 0700    Exercise Review   Progression Yes   Response to Exercise   Blood Pressure (Admit) 144/80 mmHg   Blood Pressure (Exercise) 212/64 mmHg   Blood Pressure (Exit) 116/74 mmHg   Heart Rate (Admit) 70 bpm   Heart Rate (Exercise) 126 bpm   Heart Rate (Exit) 88 bpm   Rating of Perceived Exertion (Exercise) 13   Frequency Add 3 additional days to program exercise sessions.   Duration Progress to 50 minutes of aerobic without signs/symptoms of physical distress   Intensity Rest + 30   Progression Continue progressive overload as per policy without signs/symptoms or physical distress.   Resistance Training   Training Prescription Yes   Weight 3   Reps 10-15   Interval Training   Interval Training Yes   Equipment Treadmill   Comments 3.7/4%   Treadmill   MPH 3.7   Grade 4   Minutes 30   Elliptical   Level 2   Speed 4   Minutes 15   REL-XR   Level 7   Watts 100   Minutes 20      Nutrition:  Target Goals:  Understanding of nutrition guidelines, daily intake of sodium '1500mg'$ , cholesterol '200mg'$ , calories 30% from fat and 7% or less from saturated fats, daily to have 5 or more servings of fruits and vegetables.  Biometrics:     Pre Biometrics - 12/06/14 1201    Pre Biometrics   Height $Remov'5\' 3"'sIhmWQ$  (1.6 m)   Weight 110 lb (49.896 kg)   Waist Circumference 27.5 inches   Hip Circumference 34.5 inches   Waist to Hip Ratio 0.8 %   BMI (Calculated)  19.5         Post Biometrics - 02/03/15 1631     Post  Biometrics   Height $Remov'5\' 3"'cquSbU$  (1.6 m)   Weight 108 lb 4.8 oz (49.125 kg)   Waist Circumference 27.5 inches   Hip Circumference 36 inches   Waist to Hip Ratio 0.76 %   BMI (Calculated) 19.2      Nutrition Therapy Plan and Nutrition Goals:   Nutrition Discharge: Rate Your Plate Scores:     Rate Your Plate - 25/95/63 8756    Rate Your Plate Scores   Post Score 77   Post Score % 85 %      Nutrition Goals Re-Evaluation:     Nutrition Goals Re-Evaluation      01/03/15 0934 01/24/15 1734 02/25/15 1547       Personal Goal #1 Re-Evaluation   Personal Goal #1 Leandro Reasoner would prefer not to meet individually with the dietician. Camarie eats walnuts on her cereal. She also does not like Ensure to put back on her weight but eats a teaspoon of peanut butter periodically. Keneshia would prefer not to meet individually with the dietician. Magin eats walnuts on her cereal. She also does not like Ensure to put back on her weight but eats a teaspoon of peanut butter periodically.      Goal Progress Seen Yes  Yes     Comments   Eats healthy already. She is still training to gain her weight back.      Personal Goal #2 Re-Evaluation   Personal Goal #2  To meet with RD next week         Psychosocial: Target Goals: Acknowledge presence or absence of depression, maximize coping skills, provide positive support system. Participant is able to verbalize types and ability to use techniques and skills needed for reducing  stress and depression.  Initial Review & Psychosocial Screening:     Initial Psych Review & Screening - 12/22/14 1008    Initial Review   Current issues with Current Sleep Concerns;Current Stress Concerns   Comments Ms. Bennette is the caregiver for her spouse who has cancer.   Family Dynamics   Good Support System? Yes   Barriers   Psychosocial barriers to participate in program There are no identifiable barriers or psychosocial needs.;The patient should benefit from training in stress management and relaxation.   Screening Interventions   Interventions Encouraged to exercise      Quality of Life Scores:     Quality of Life - 02/04/15 1334    Quality of Life Scores   Health/Function Post 27.9 %   Socioeconomic Post 28 %   Psych/Spiritual Post 25.29 %   Family Post 26.1 %   GLOBAL Post 27.09 %      PHQ-9:     Recent Review Flowsheet Data    Depression screen [facility] 2/9 02/04/2015   Decreased Interest 0   Down, Depressed, Hopeless 1   PHQ - 2 Score 1   Altered sleeping 2   Tired, decreased energy 1   Change in appetite 0   Feeling bad or failure about yourself  0   Trouble concentrating 0   Moving slowly or fidgety/restless 0   Suicidal thoughts 0   PHQ-9 Score 4      Psychosocial Evaluation and Intervention:     Psychosocial Evaluation - 12/22/14 1010    Psychosocial Evaluation & Interventions   Interventions Relaxation education;Stress management education;Encouraged to exercise with the program and follow exercise prescription  Comments Counselor met with Ms. Redmond today for initial psychosocial evaluation.  She is a 74 year old well-adjusted female who had a heart attack while on vacation in March of this year.  She has a good support system with a spouse and close friends and is involved in her faith community.  Ms. Sides denies a history of depression or current symptoms, but has a great deal of stress in her life.  Her spouse has multiple health issues with lung  and pancreatic cancer, and Ms. Alverio is the primary caregiver for him.  She also just lost her mother in the past year and this was a significant loss to her.  Ms. Oliveira has been a person who exercises consistently, but she does recognize the need to learn new ways to cope with stress and learn to relax.  She has some difficulty sleeping and has a limited appetite. Counselor shared some natural options for sleep to consider with her pharmacist.   Counselor discussed and practiced breathing exercises and meditation with Ms. Dehart and will continue to follow with her about this as needed.   Continued Psychosocial Services Needed --  Ms. Ealey will benefit from all of the psychoeducational components of this program, especially stress management and learning new ways to relax.        Psychosocial Re-Evaluation:   Vocational Rehabilitation: Provide vocational rehab assistance to qualifying candidates.   Vocational Rehab Evaluation & Intervention:     Vocational Rehab - 01/30/15 0944    Initial Vocational Rehab Evaluation & Intervention   Assessment shows need for Vocational Rehabilitation No      Education: Education Goals: Education classes will be provided on a weekly basis, covering required topics. Participant will state understanding/return demonstration of topics presented.  Learning Barriers/Preferences:     Learning Barriers/Preferences - 12/06/14 1159    Learning Barriers/Preferences   Learning Barriers None   Learning Preferences Group Instruction;Written Material;Video      Education Topics: General Nutrition Guidelines/Fats and Fiber: -Group instruction provided by verbal, written material, models and posters to present the general guidelines for heart healthy nutrition. Gives an explanation and review of dietary fats and fiber.   Controlling Sodium/Reading Food Labels: -Group verbal and written material supporting the discussion of sodium use in heart healthy nutrition.  Review and explanation with models, verbal and written materials for utilization of the food label.   Exercise Physiology & Risk Factors: - Group verbal and written instruction with models to review the exercise physiology of the cardiovascular system and associated critical values. Details cardiovascular disease risk factors and the goals associated with each risk factor.   Aerobic Exercise & Resistance Training: - Gives group verbal and written discussion on the health impact of inactivity. On the components of aerobic and resistive training programs and the benefits of this training and how to safely progress through these programs.   Flexibility, Balance, General Exercise Guidelines: - Provides group verbal and written instruction on the benefits of flexibility and balance training programs. Provides general exercise guidelines with specific guidelines to those with heart or lung disease. Demonstration and skill practice provided.   Stress Management: - Provides group verbal and written instruction about the health risks of elevated stress, cause of high stress, and healthy ways to reduce stress.   Depression: - Provides group verbal and written instruction on the correlation between heart/lung disease and depressed mood, treatment options, and the stigmas associated with seeking treatment.   Anatomy & Physiology of the Heart: -  Group verbal and written instruction and models provide basic cardiac anatomy and physiology, with the coronary electrical and arterial systems. Review of: AMI, Angina, Valve disease, Heart Failure, Cardiac Arrhythmia, Pacemakers, and the ICD.   Cardiac Procedures: - Group verbal and written instruction and models to describe the testing methods done to diagnose heart disease. Reviews the outcomes of the test results. Describes the treatment choices: Medical Management, Angioplasty, or Coronary Bypass Surgery.   Cardiac Medications: - Group verbal and  written instruction to review commonly prescribed medications for heart disease. Reviews the medication, class of the drug, and side effects. Includes the steps to properly store meds and maintain the prescription regimen.   Go Sex-Intimacy & Heart Disease, Get SMART - Goal Setting: - Group verbal and written instruction through game format to discuss heart disease and the return to sexual intimacy. Provides group verbal and written material to discuss and apply goal setting through the application of the S.M.A.R.T. Method.   Other Matters of the Heart: - Provides group verbal, written materials and models to describe Heart Failure, Angina, Valve Disease, and Diabetes in the realm of heart disease. Includes description of the disease process and treatment options available to the cardiac patient.   Exercise & Equipment Safety: - Individual verbal instruction and demonstration of equipment use and safety with use of the equipment.   Infection Prevention: - Provides verbal and written material to individual with discussion of infection control including proper hand washing and proper equipment cleaning during exercise session.   Falls Prevention: - Provides verbal and written material to individual with discussion of falls prevention and safety.   Diabetes: - Individual verbal and written instruction to review signs/symptoms of diabetes, desired ranges of glucose level fasting, after meals and with exercise. Advice that pre and post exercise glucose checks will be done for 3 sessions at entry of program.    Knowledge Questionnaire Score:     Knowledge Questionnaire Score - 02/04/15 1334    Knowledge Questionnaire Score   Post Score 26      Personal Goals and Risk Factors at Admission:     Personal Goals and Risk Factors at Admission - 01/02/15 1202    Personal Goals and Risk Factors on Admission    Weight Management Yes   Intervention Learn and follow the exercise and diet  guidelines while in the program. Utilize the nutrition and education classes to help gain knowledge of the diet and exercise expectations in the program   Increase Aerobic Exercise and Physical Activity Yes   Intervention While in program, learn and follow the exercise prescription taught. Start at a low level workload and increase workload after able to maintain previous level for 30 minutes. Increase time before increasing intensity.      Personal Goals and Risk Factors Review:      Goals and Risk Factor Review      01/03/15 0935 01/14/15 1255 01/24/15 1735 01/30/15 0939 02/25/15 1547   Weight Management   Goals Progress/Improvement seen   Yes     Comments   Maintaining weight     Increase Aerobic Exercise and Physical Activity   Goals Progress/Improvement seen  Yes Yes Yes Yes Yes   Comments "I feel so much better since I got back in this gym". "Cardiac Rehab has helped me." Is on Elliptical for 10-15 minutes! Feels the most challenged on the elliptical and has seen some encouraging progression since starting rehab. She is currently walking on almost every day of the  week, including days she attends rehab. She says exercise will remain a part of her lifestyle.    Hopes to return to Cardiac REhab this Monday. Has been walking a little bit.       Personal Goals Discharge:     Comments: Hope to return to Cardiac Rehab this Monday.

## 2015-02-28 ENCOUNTER — Encounter: Payer: Medicare Other | Attending: Internal Medicine

## 2015-02-28 DIAGNOSIS — I214 Non-ST elevation (NSTEMI) myocardial infarction: Secondary | ICD-10-CM

## 2015-02-28 NOTE — Progress Notes (Signed)
Cardiac Individual Treatment Plan  Patient Details  Name: Debra Moody MRN: [id number] Date of Birth: 1941-03-17 Referring Provider:  Rusty Aus, MD  Initial Encounter Date:    Visit Diagnosis: Acute non-ST-elevation myocardial infarction  Patient's Home Medications on Admission:  Current outpatient prescriptions:  .  ALPRAZolam (XANAX) 0.25 MG tablet, Take by mouth., Disp: , Rfl:  .  aspirin 81 MG tablet, Take 81 mg by mouth daily., Disp: , Rfl:  .  Biotin 1000 MCG tablet, Take 1,000 mcg by mouth daily., Disp: , Rfl:  .  Calcium-Magnesium-Vitamin D (CALCIUM 500 PO), Take 1,000 mg by mouth daily., Disp: , Rfl:  .  carvedilol (COREG) 3.125 MG tablet, Take 1 tablet (3.125 mg total) by mouth 2 (two) times daily with a meal., Disp: 180 tablet, Rfl: 3 .  cetirizine (ZYRTEC) 10 MG tablet, Take 10 mg by mouth daily., Disp: , Rfl:  .  Cetirizine HCl 10 MG CAPS, Take 10 mg by mouth daily. , Disp: , Rfl:  .  Coenzyme Q10 (COQ10) 100 MG CAPS, Take 100 mg by mouth daily., Disp: , Rfl:  .  diphenhydrAMINE (BENADRYL) 25 mg capsule, Take 25 mg by mouth every 6 (six) hours as needed. , Disp: , Rfl:  .  ezetimibe (ZETIA) 10 MG tablet, Take 10 mg by mouth daily., Disp: , Rfl:  .  fexofenadine (ALLEGRA) 180 MG tablet, Take by mouth., Disp: , Rfl:  .  hyoscyamine (LEVSIN, ANASPAZ) 0.125 MG tablet, Take by mouth., Disp: , Rfl:  .  lisinopril (PRINIVIL,ZESTRIL) 2.5 MG tablet, Take 1 tablet (2.5 mg total) by mouth daily., Disp: 90 tablet, Rfl: 3 .  magnesium oxide (MAG-OX) 400 MG tablet, Take 400 mg by mouth daily., Disp: , Rfl:  .  Melatonin 5 MG TABS, Take by mouth., Disp: , Rfl:  .  Melatonin 5 MG TBDP, Take 5 mg by mouth as needed., Disp: , Rfl:  .  Omega-3 Fatty Acids (FISH OIL) 1000 MG CAPS, Take 1,200 mg by mouth daily. , Disp: , Rfl:  .  pantoprazole (PROTONIX) 40 MG tablet, Take 40 mg by mouth 2 (two) times daily., Disp: , Rfl:  .  Phenylephrine-Acetaminophen 5-325 MG TABS, Take by mouth as  needed. , Disp: , Rfl:  .  Polyethylene Glycol 3350 (MIRALAX PO), Take by mouth every evening., Disp: , Rfl:  .  Probiotic Product (PROBIOTIC DAILY PO), Take by mouth daily., Disp: , Rfl:  .  ranitidine (ZANTAC) 75 MG tablet, Take 150 mg by mouth 2 (two) times daily., Disp: , Rfl:  .  Simethicone 125 MG TABS, Take by mouth as needed. , Disp: , Rfl:  .  Vitamin D, Cholecalciferol, 400 UNITS CAPS, Take by mouth daily., Disp: , Rfl:   Past Medical History: Past Medical History  Diagnosis Date  . Osteoporosis   . Hypertension   . History of shingles 2015  . Diverticulitis   . 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 GERD (gastroesophageal reflux disease)   . Hyperlipidemia   . Skin cancer, basal cell     Tobacco Use: History  Smoking status  . Former Smoker -- 18 years  . Types: Cigarettes  . Quit date: 12/01/1999  Smokeless tobacco  . Not on file    Labs: Recent Review Flowsheet Data    There is no flowsheet data to display.       Exercise Target Goals:    Exercise Program Goal: Individual  exercise prescription set with THRR, safety & activity barriers. Participant demonstrates ability to understand and report RPE using BORG scale, to self-measure pulse accurately, and to acknowledge the importance of the exercise prescription.  Exercise Prescription Goal: Starting with aerobic activity 30 plus minutes a day, 3 days per week for initial exercise prescription. Provide home exercise prescription and guidelines that participant acknowledges understanding prior to discharge.  Activity Barriers & Risk Stratification:     Activity Barriers & Risk Stratification - 01/02/15 1158    Activity Barriers & Risk Stratification   Activity Barriers History of Falls   Risk Stratification High      6 Minute Walk:     6 Minute Walk      12/06/14 1200 02/03/15 1630     6 Minute Walk   Phase Initial Discharge    Distance 1910 feet 2200  feet    Walk Time 6 minutes 6 minutes    Resting HR 66 bpm 70 bpm    Resting BP 122/70 mmHg 144/80 mmHg    Max Ex. HR 130 bpm 99 bpm    Max Ex. BP 152/58 mmHg 212/64 mmHg    RPE 13 13    Perceived Dyspnea   2    Symptoms No No       Initial Exercise Prescription:   Exercise Prescription Changes:     Exercise Prescription Changes      12/29/14 0800 01/04/15 0800 01/12/15 0800 01/19/15 0900 01/31/15 1630   Exercise Review   Progression Yes Yes  Yes Yes   Response to Exercise   Blood Pressure (Admit)  102/64 mmHg   130/80 mmHg   Blood Pressure (Exercise)  124/60 mmHg   150/78 mmHg   Blood Pressure (Exit)  102/64 mmHg   94/60 mmHg   Heart Rate (Admit)  60 bpm   98 bpm   Heart Rate (Exercise)  98 bpm   122 bpm   Heart Rate (Exit)  78 bpm   90 bpm   Rating of Perceived Exertion (Exercise)  12   13   Frequency  Add 3 additional days to program exercise sessions.   Add 3 additional days to program exercise sessions.   Duration  Progress to 50 minutes of aerobic without signs/symptoms of physical distress   Progress to 50 minutes of aerobic without signs/symptoms of physical distress   Intensity  Rest + 30   Rest + 30   Progression  Continue progressive overload as per policy without signs/symptoms or physical distress.   Continue progressive overload as per policy without signs/symptoms or physical distress.   Resistance Training   Training Prescription  Yes   Yes   Weight  3   3   Reps  10-12   10-15   Interval Training   Interval Training  No Yes  Yes   Equipment   Treadmill  Treadmill   Comments   3.5/2%  3.7/2%   Treadmill   MPH  3.4   3.7   Grade  1   3   Minutes  30   30   Elliptical   Level $Remo'3 2  2 2   'rVuHG$ Speed  $Remo'3  4 4   'sPCVC$ Minutes  15   15   REL-XR   Level     7   Watts     90   Minutes     20     02/21/15 0700           Exercise  Review   Progression Yes       Response to Exercise   Blood Pressure (Admit) 144/80 mmHg       Blood Pressure (Exercise) 212/64 mmHg        Blood Pressure (Exit) 116/74 mmHg       Heart Rate (Admit) 70 bpm       Heart Rate (Exercise) 126 bpm       Heart Rate (Exit) 88 bpm       Rating of Perceived Exertion (Exercise) 13       Frequency Add 3 additional days to program exercise sessions.       Duration Progress to 50 minutes of aerobic without signs/symptoms of physical distress       Intensity Rest + 30       Progression Continue progressive overload as per policy without signs/symptoms or physical distress.       Resistance Training   Training Prescription Yes       Weight 3       Reps 10-15       Interval Training   Interval Training Yes       Equipment Treadmill       Comments 3.7/4%       Treadmill   MPH 3.7       Grade 4       Minutes 30       Elliptical   Level 2       Speed 4       Minutes 15       REL-XR   Level 7       Watts 100       Minutes 20          Discharge Exercise Prescription (Final Exercise Prescription Changes):     Exercise Prescription Changes - 02/21/15 0700    Exercise Review   Progression Yes   Response to Exercise   Blood Pressure (Admit) 144/80 mmHg   Blood Pressure (Exercise) 212/64 mmHg   Blood Pressure (Exit) 116/74 mmHg   Heart Rate (Admit) 70 bpm   Heart Rate (Exercise) 126 bpm   Heart Rate (Exit) 88 bpm   Rating of Perceived Exertion (Exercise) 13   Frequency Add 3 additional days to program exercise sessions.   Duration Progress to 50 minutes of aerobic without signs/symptoms of physical distress   Intensity Rest + 30   Progression Continue progressive overload as per policy without signs/symptoms or physical distress.   Resistance Training   Training Prescription Yes   Weight 3   Reps 10-15   Interval Training   Interval Training Yes   Equipment Treadmill   Comments 3.7/4%   Treadmill   MPH 3.7   Grade 4   Minutes 30   Elliptical   Level 2   Speed 4   Minutes 15   REL-XR   Level 7   Watts 100   Minutes 20      Nutrition:  Target Goals:  Understanding of nutrition guidelines, daily intake of sodium '1500mg'$ , cholesterol '200mg'$ , calories 30% from fat and 7% or less from saturated fats, daily to have 5 or more servings of fruits and vegetables.  Biometrics:     Pre Biometrics - 12/06/14 1201    Pre Biometrics   Height $Remov'5\' 3"'NkvFsi$  (1.6 m)   Weight 110 lb (49.896 kg)   Waist Circumference 27.5 inches   Hip Circumference 34.5 inches   Waist to Hip Ratio 0.8 %   BMI (Calculated)  19.5         Post Biometrics - 02/03/15 1631     Post  Biometrics   Height $Remov'5\' 3"'wAnJNI$  (1.6 m)   Weight 108 lb 4.8 oz (49.125 kg)   Waist Circumference 27.5 inches   Hip Circumference 36 inches   Waist to Hip Ratio 0.76 %   BMI (Calculated) 19.2      Nutrition Therapy Plan and Nutrition Goals:   Nutrition Discharge: Rate Your Plate Scores:     Rate Your Plate - 37/34/28 7681    Rate Your Plate Scores   Post Score 77   Post Score % 85 %      Nutrition Goals Re-Evaluation:     Nutrition Goals Re-Evaluation      01/03/15 0934 01/24/15 1734 02/25/15 1547       Personal Goal #1 Re-Evaluation   Personal Goal #1 Leandro Reasoner would prefer not to meet individually with the dietician. Reverie eats walnuts on her cereal. She also does not like Ensure to put back on her weight but eats a teaspoon of peanut butter periodically. Reghan would prefer not to meet individually with the dietician. Everly eats walnuts on her cereal. She also does not like Ensure to put back on her weight but eats a teaspoon of peanut butter periodically.      Goal Progress Seen Yes  Yes     Comments   Eats healthy already. She is still training to gain her weight back.      Personal Goal #2 Re-Evaluation   Personal Goal #2  To meet with RD next week         Psychosocial: Target Goals: Acknowledge presence or absence of depression, maximize coping skills, provide positive support system. Participant is able to verbalize types and ability to use techniques and skills needed for reducing  stress and depression.  Initial Review & Psychosocial Screening:     Initial Psych Review & Screening - 12/22/14 1008    Initial Review   Current issues with Current Sleep Concerns;Current Stress Concerns   Comments Ms. Wonders is the caregiver for her spouse who has cancer.   Family Dynamics   Good Support System? Yes   Barriers   Psychosocial barriers to participate in program There are no identifiable barriers or psychosocial needs.;The patient should benefit from training in stress management and relaxation.   Screening Interventions   Interventions Encouraged to exercise      Quality of Life Scores:     Quality of Life - 02/04/15 1334    Quality of Life Scores   Health/Function Post 27.9 %   Socioeconomic Post 28 %   Psych/Spiritual Post 25.29 %   Family Post 26.1 %   GLOBAL Post 27.09 %      PHQ-9:     Recent Review Flowsheet Data    Depression screen [facility] 2/9 02/04/2015   Decreased Interest 0   Down, Depressed, Hopeless 1   PHQ - 2 Score 1   Altered sleeping 2   Tired, decreased energy 1   Change in appetite 0   Feeling bad or failure about yourself  0   Trouble concentrating 0   Moving slowly or fidgety/restless 0   Suicidal thoughts 0   PHQ-9 Score 4      Psychosocial Evaluation and Intervention:     Psychosocial Evaluation - 12/22/14 1010    Psychosocial Evaluation & Interventions   Interventions Relaxation education;Stress management education;Encouraged to exercise with the program and follow exercise prescription  Comments Counselor met with Ms. Lovecchio today for initial psychosocial evaluation.  She is a 74 year old well-adjusted female who had a heart attack while on vacation in March of this year.  She has a good support system with a spouse and close friends and is involved in her faith community.  Ms. Mumpower denies a history of depression or current symptoms, but has a great deal of stress in her life.  Her spouse has multiple health issues with lung  and pancreatic cancer, and Ms. Doig is the primary caregiver for him.  She also just lost her mother in the past year and this was a significant loss to her.  Ms. Efaw has been a person who exercises consistently, but she does recognize the need to learn new ways to cope with stress and learn to relax.  She has some difficulty sleeping and has a limited appetite. Counselor shared some natural options for sleep to consider with her pharmacist.   Counselor discussed and practiced breathing exercises and meditation with Ms. Hassey and will continue to follow with her about this as needed.   Continued Psychosocial Services Needed --  Ms. Galambos will benefit from all of the psychoeducational components of this program, especially stress management and learning new ways to relax.        Psychosocial Re-Evaluation:   Vocational Rehabilitation: Provide vocational rehab assistance to qualifying candidates.   Vocational Rehab Evaluation & Intervention:     Vocational Rehab - 01/30/15 0944    Initial Vocational Rehab Evaluation & Intervention   Assessment shows need for Vocational Rehabilitation No      Education: Education Goals: Education classes will be provided on a weekly basis, covering required topics. Participant will state understanding/return demonstration of topics presented.  Learning Barriers/Preferences:     Learning Barriers/Preferences - 12/06/14 1159    Learning Barriers/Preferences   Learning Barriers None   Learning Preferences Group Instruction;Written Material;Video      Education Topics: General Nutrition Guidelines/Fats and Fiber: -Group instruction provided by verbal, written material, models and posters to present the general guidelines for heart healthy nutrition. Gives an explanation and review of dietary fats and fiber.          Cardiac Rehab from 02/28/2015 in [facility] Cardiac Rehab   Date  01/31/15   Educator  CR   Instruction Review Code  2- meets goals/outcomes       Controlling Sodium/Reading Food Labels: -Group verbal and written material supporting the discussion of sodium use in heart healthy nutrition. Review and explanation with models, verbal and written materials for utilization of the food label.      Cardiac Rehab from 02/28/2015 in [facility] Cardiac Rehab   Date  12/20/14   Educator  CR   Instruction Review Code  2- meets goals/outcomes      Exercise Physiology & Risk Factors: - Group verbal and written instruction with models to review the exercise physiology of the cardiovascular system and associated critical values. Details cardiovascular disease risk factors and the goals associated with each risk factor.      Cardiac Rehab from 02/28/2015 in [facility] Cardiac Rehab   Date  12/29/14   Educator  [place] CEP   Instruction Review Code  2- meets goals/outcomes      Aerobic Exercise & Resistance Training: - Gives group verbal and written discussion on the health impact of inactivity. On the components of aerobic and resistive training programs and the benefits of this training and how to  safely progress through these programs.      Cardiac Rehab from 02/28/2015 in [facility] Cardiac Rehab   Date  02/28/15   Educator  RM   Instruction Review Code  2- meets goals/outcomes      Flexibility, Balance, General Exercise Guidelines: - Provides group verbal and written instruction on the benefits of flexibility and balance training programs. Provides general exercise guidelines with specific guidelines to those with heart or lung disease. Demonstration and skill practice provided.      Cardiac Rehab from 02/28/2015 in [facility] Cardiac Rehab   Date  01/05/15   Educator  Rm   Instruction Review Code  2- meets goals/outcomes      Stress Management: - Provides group verbal and written instruction about the health risks of elevated stress, cause of high stress, and healthy ways to reduce stress.      Cardiac Rehab from 02/28/2015 in [facility] Cardiac  Rehab   Date  01/12/15   Educator  [facility]   Instruction Review Code  2- meets goals/outcomes      Depression: - Provides group verbal and written instruction on the correlation between heart/lung disease and depressed mood, treatment options, and the stigmas associated with seeking treatment.      Cardiac Rehab from 02/28/2015 in [facility] Cardiac Rehab   Date  12/22/14   Educator  [facility]   Instruction Review Code  2- meets goals/outcomes      Anatomy & Physiology of the Heart: - Group verbal and written instruction and models provide basic cardiac anatomy and physiology, with the coronary electrical and arterial systems. Review of: AMI, Angina, Valve disease, Heart Failure, Cardiac Arrhythmia, Pacemakers, and the ICD.      Cardiac Rehab from 02/28/2015 in [facility] Cardiac Rehab   Date  01/17/15   Educator  S.Bices   Instruction Review Code  2- meets goals/outcomes      Cardiac Procedures: - Group verbal and written instruction and models to describe the testing methods done to diagnose heart disease. Reviews the outcomes of the test results. Describes the treatment choices: Medical Management, Angioplasty, or Coronary Bypass Surgery.   Cardiac Medications: - Group verbal and written instruction to review commonly prescribed medications for heart disease. Reviews the medication, class of the drug, and side effects. Includes the steps to properly store meds and maintain the prescription regimen.      Cardiac Rehab from 02/28/2015 in [facility] Cardiac Rehab   Date  12/27/14   Educator  SB   Instruction Review Code  2- meets goals/outcomes      Go Sex-Intimacy & Heart Disease, Get SMART - Goal Setting: - Group verbal and written instruction through game format to discuss heart disease and the return to sexual intimacy. Provides group verbal and written material to discuss and apply goal setting through the application of the S.M.A.R.T. Method.   Other Matters of the Heart: - Provides group verbal,  written materials and models to describe Heart Failure, Angina, Valve Disease, and Diabetes in the realm of heart disease. Includes description of the disease process and treatment options available to the cardiac patient.   Exercise & Equipment Safety: - Individual verbal instruction and demonstration of equipment use and safety with use of the equipment.   Infection Prevention: - Provides verbal and written material to individual with discussion of infection control including proper hand washing and proper equipment cleaning during exercise session.   Falls Prevention: - Provides verbal and written material to individual with discussion of falls prevention and  safety.   Diabetes: - Individual verbal and written instruction to review signs/symptoms of diabetes, desired ranges of glucose level fasting, after meals and with exercise. Advice that pre and post exercise glucose checks will be done for 3 sessions at entry of program.    Knowledge Questionnaire Score:     Knowledge Questionnaire Score - 02/04/15 1334    Knowledge Questionnaire Score   Post Score 26      Personal Goals and Risk Factors at Admission:     Personal Goals and Risk Factors at Admission - 01/02/15 1202    Personal Goals and Risk Factors on Admission    Weight Management Yes   Intervention Learn and follow the exercise and diet guidelines while in the program. Utilize the nutrition and education classes to help gain knowledge of the diet and exercise expectations in the program   Increase Aerobic Exercise and Physical Activity Yes   Intervention While in program, learn and follow the exercise prescription taught. Start at a low level workload and increase workload after able to maintain previous level for 30 minutes. Increase time before increasing intensity.      Personal Goals and Risk Factors Review:      Goals and Risk Factor Review      01/03/15 0935 01/14/15 1255 01/24/15 1735 01/30/15 0939  02/25/15 1547   Weight Management   Goals Progress/Improvement seen   Yes     Comments   Maintaining weight     Increase Aerobic Exercise and Physical Activity   Goals Progress/Improvement seen  Yes Yes Yes Yes Yes   Comments "I feel so much better since I got back in this gym". "Cardiac Rehab has helped me." Is on Elliptical for 10-15 minutes! Feels the most challenged on the elliptical and has seen some encouraging progression since starting rehab. She is currently walking on almost every day of the week, including days she attends rehab. She says exercise will remain a part of her lifestyle.    Hopes to return to Cardiac REhab this Monday. Has been walking a little bit.       Personal Goals Discharge:     Comments: 30 day review. Continue with ITP

## 2015-02-28 NOTE — Progress Notes (Signed)
Daily Session Note  Patient Details  Name: Debra Moody MRN: [id number] Date of Birth: [birth year] Referring Provider:  Rusty Aus, MD  Encounter Date: 02/28/2015  Check In:     Session Check In - 02/28/15 1124    Check-In   Staff Present Heath Lark RN, BSN, CCRP;Kelly Hayes BS, ACSM CEP Exercise Physiologist;Renee [place] MS, ACSM CEP Exercise Physiologist   ER physicians immediately available to respond to emergencies See telemetry face sheet for immediately available ER MD   Medication changes reported     No   Fall or balance concerns reported    No   Warm-up and Cool-down Performed on first and last piece of equipment   VAD Patient? No   Pain Assessment   Currently in Pain? No/denies         Goals Met:  Independence with exercise equipment Exercise tolerated well No report of cardiac concerns or symptoms Strength training completed today  Goals Unmet:  Not Applicable  Goals Comments:  [name] did well with her exercise today after being out for a couple weeks.   Dr. Emily Filbert is Medical Director for [facility] and [facility].

## 2015-03-02 ENCOUNTER — Other Ambulatory Visit: Payer: Self-pay | Admitting: *Deleted

## 2015-03-02 DIAGNOSIS — I214 Non-ST elevation (NSTEMI) myocardial infarction: Secondary | ICD-10-CM

## 2015-03-04 ENCOUNTER — Encounter: Payer: Medicare Other | Admitting: *Deleted

## 2015-03-04 DIAGNOSIS — I214 Non-ST elevation (NSTEMI) myocardial infarction: Secondary | ICD-10-CM | POA: Diagnosis not present

## 2015-03-04 NOTE — Progress Notes (Signed)
Daily Session Note  Patient Details  Name: Debra Moody MRN: [id number] Date of Birth: 08/26/1940 Referring Provider:  Miller, Mark F, MD  Encounter Date: 03/04/2015  Check In:     Session Check In - 03/04/15 0935    Check-In   Staff Present Susanne Bice RN, BSN, CCRP;Carroll Enterkin RN, BSN   ER physicians immediately available to respond to emergencies See telemetry face sheet for immediately available ER MD   Medication changes reported     No   Fall or balance concerns reported    No   Warm-up and Cool-down Performed on first and last piece of equipment   VAD Patient? No   Pain Assessment   Currently in Pain? No/denies         Goals Met:  Independence with exercise equipment Exercise tolerated well No report of cardiac concerns or symptoms Strength training completed today  Goals Unmet:  Not Applicable  Goals Comments:Kendall McKinney BS, Exercise Physiologist staff today   Dr. Mark Miller is Medical Director for [facility] and [facility]. 

## 2015-03-07 ENCOUNTER — Encounter: Payer: Self-pay | Admitting: Cardiovascular Disease

## 2015-03-07 ENCOUNTER — Ambulatory Visit (INDEPENDENT_AMBULATORY_CARE_PROVIDER_SITE_OTHER): Payer: Medicare Other | Admitting: Cardiovascular Disease

## 2015-03-07 VITALS — BP 84/60 | HR 69 | Ht 63.0 in | Wt 108.2 lb

## 2015-03-07 DIAGNOSIS — I5181 Takotsubo syndrome: Secondary | ICD-10-CM | POA: Diagnosis not present

## 2015-03-07 DIAGNOSIS — E785 Hyperlipidemia, unspecified: Secondary | ICD-10-CM

## 2015-03-07 DIAGNOSIS — I951 Orthostatic hypotension: Secondary | ICD-10-CM | POA: Insufficient documentation

## 2015-03-07 DIAGNOSIS — R0789 Other chest pain: Secondary | ICD-10-CM | POA: Diagnosis not present

## 2015-03-07 DIAGNOSIS — I420 Dilated cardiomyopathy: Secondary | ICD-10-CM | POA: Diagnosis not present

## 2015-03-07 NOTE — Assessment & Plan Note (Signed)
Unable to tolerate statins. She will continue on Zetia 10 mg daily

## 2015-03-07 NOTE — Assessment & Plan Note (Signed)
Ejection fraction improved back up to normal. She is having orthostasis. Recommended she decrease her Coreg down to 3.125 mill grams daily, lisinopril 2.5 mg daily

## 2015-03-07 NOTE — Assessment & Plan Note (Signed)
Given her orthostatic symptoms at times, we'll decrease the carvedilol down to 3.125 mg daily

## 2015-03-07 NOTE — Assessment & Plan Note (Signed)
She denies any recent stressors Recent echocardiogram showing normal ejection fraction Medication changes as above

## 2015-03-07 NOTE — Progress Notes (Signed)
Patient ID: Debra Moody, female    DOB: 05-Mar-1941, 74 y.o.   MRN: [id number]  HPI Comments: Debra Moody is a pleasant 74 year old woman with previous history of GERD, diverticulitis, hysterectomy in the 80s, prior bladder surgery, elbow fracture October 2014, shingles September 2015, previously seen for chest pain, elevated cardiac enzymes, stress related cardiomyopathy. She presents today for follow-up of her cardiomyopathy  In follow up today, she reports having episode of near syncope last week. She cut her lisinopril in half Periodically she continues to have episodes of lightheadedness One episode of chest pressure while in [place] in the setting of being in the extreme heat She is otherwise feeling well with no complaints  Follow-up ejection fraction is normal on echocardiogram  EKG on today's visit shows normal sinus rhythm with rate 69 bpm, no significant ST or T-wave changes  Other past medical history  she was traveling out of state, was in [place] when she developed amnesia type symptoms. Her husband took her to the hospital. She had MRI and CT of the head that showed no stroke. Further workup showed abnormal EKG, abnormal echocardiogram, elevated troponin up to more than 3. Ischemia could not be excluded and she was taken to the cardiac catheterization lab 11/03/2014. 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 Alendronate Sodium Other (See Comments)  . Ibandronic Acid Other (See Comments)  . Simvastatin Other  (See Comments)    Myalgia.  Marland Kitchen Amoxicillin-Pot Clavulanate Rash    Outpatient Encounter Prescriptions as of 03/07/2015  Medication Sig  . ALPRAZolam (XANAX) 0.25 MG tablet Take by mouth.  Marland Kitchen aspirin 81 MG tablet Take 81 mg by mouth daily.  . Biotin 1000 MCG tablet Take 1,000 mcg by mouth daily.  . Calcium-Magnesium-Vitamin D (CALCIUM 500 PO) Take 1,000 mg by mouth daily.  . carvedilol (COREG) 3.125 MG tablet Take 1 tablet (3.125 mg total) by mouth 2 (two) times daily with a meal.  . cetirizine (ZYRTEC) 10 MG tablet Take 10 mg by mouth daily.  . Cetirizine HCl 10 MG CAPS Take 10 mg by mouth daily.   . Coenzyme Q10 (COQ10) 100 MG CAPS Take 100 mg by mouth daily.  . diphenhydrAMINE (BENADRYL) 25 mg capsule Take 25 mg by mouth every 6 (six) hours as needed.   . ezetimibe (ZETIA) 10 MG tablet Take 10 mg by mouth daily.  . fexofenadine (ALLEGRA) 180 MG tablet Take by mouth.  . hyoscyamine (LEVSIN, ANASPAZ) 0.125 MG tablet Take by mouth.  Marland Kitchen lisinopril (PRINIVIL,ZESTRIL) 2.5 MG tablet Take 1 tablet (2.5 mg total) by mouth daily.  . magnesium oxide (MAG-OX) 400 MG tablet Take 400 mg by mouth daily.  . Melatonin 5 MG TBDP Take 5 mg by mouth as needed.  . Omega-3 Fatty Acids (FISH OIL) 1000 MG CAPS Take 1,200 mg by mouth daily.   . pantoprazole (PROTONIX) 40 MG tablet Take 40 mg by mouth 2 (two) times daily.  Marland Kitchen Phenylephrine-Acetaminophen 5-325 MG TABS Take by mouth as needed.   . Polyethylene Glycol 3350 (MIRALAX PO) Take by mouth  every evening.  . Probiotic Product (PROBIOTIC DAILY PO) Take by mouth daily.  . ranitidine (ZANTAC) 75 MG tablet Take 150 mg by mouth 2 (two) times daily.  . Simethicone 125 MG TABS Take by mouth as needed.   . Vitamin D, Cholecalciferol, 400 UNITS CAPS Take by mouth daily.  . [DISCONTINUED] Melatonin 5 MG TABS Take by mouth.   No facility-administered encounter medications on file as of 03/07/2015.    Past Medical History  Diagnosis Date  . Osteoporosis   .  Hypertension   . History of shingles 2015  . Diverticulitis   . 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 GERD (gastroesophageal reflux disease)   . Hyperlipidemia   . Skin cancer, basal cell     Past Surgical History  Procedure Laterality Date  . Abdominal hysterectomy    . Gum surgery    . Cardiac catheterization  11/03/2014    Social History  reports that she quit smoking about 15 years ago. Her smoking use included Cigarettes. She quit after 18 years of use. She does not have any smokeless tobacco history on file. She reports that she drinks alcohol. She reports that she does not use illicit drugs.  Family History family history includes Heart attack in her mother; Heart attack (age of onset: 53) in her father; Heart disease in her father and mother.  Review of Systems  Constitutional: Negative.   Respiratory: Negative.   Cardiovascular: Negative.   Gastrointestinal: Negative.   Musculoskeletal: Negative.   Skin: Negative.   Neurological: Negative.   Hematological: Negative.   Psychiatric/Behavioral: Negative.   All other systems reviewed and are negative.   BP 84/60 mmHg  Pulse 69  Ht 5\' 3"  (1.6 m)  Wt 108 lb 4 oz (49.102 kg)  BMI 19.18 kg/m2 Blood pressure on my recheck was 465 systolic Physical Exam  Constitutional: She is oriented to person, place, and time. She appears well-developed and well-nourished.  HENT:  Head: Normocephalic.  Nose: Nose normal.  Mouth/Throat: Oropharynx is clear and moist.  Eyes: Conjunctivae are normal. Pupils are equal, round, and reactive to light.  Neck: Normal range of motion. Neck supple. No JVD present.  Cardiovascular: Normal rate, regular rhythm, S1 normal, S2 normal, normal heart sounds and intact distal pulses.  Exam reveals no gallop and no friction rub.   No murmur heard. Pulmonary/Chest: Effort normal and breath sounds normal. No respiratory distress. She has no wheezes. She has no  rales. She exhibits no tenderness.  Abdominal: Soft. Bowel sounds are normal. She exhibits no distension. There is no tenderness.  Musculoskeletal: Normal range of motion. She exhibits no edema or tenderness.  Lymphadenopathy:    She has no cervical adenopathy.  Neurological: She is alert and oriented to person, place, and time. Coordination normal.  Skin: Skin is warm and dry. No rash noted. No erythema.  Psychiatric: She has a normal mood and affect. Her behavior is normal. Judgment and thought content normal.    Assessment and Plan  Nursing note and vitals reviewed.

## 2015-03-07 NOTE — Patient Instructions (Signed)
You are doing well. Please take coreg/cardvedilol once a day in the PM Lisinopril in the AM  Please call us if you have new issues that need to be addressed before your next appt.  Your physician wants you to follow-up in: 6 months.  You will receive a reminder letter in the mail two months in advance. If you don't receive a letter, please call our office to schedule the follow-up appointment.

## 2015-03-08 ENCOUNTER — Encounter: Payer: Medicare Other | Admitting: *Deleted

## 2015-03-08 DIAGNOSIS — I214 Non-ST elevation (NSTEMI) myocardial infarction: Secondary | ICD-10-CM

## 2015-03-08 NOTE — Progress Notes (Signed)
Daily Session Note  Patient Details  Name: Debra Moody MRN: [id number] Date of Birth: 1941/06/07 Referring Provider:  Rusty Aus, MD  Encounter Date: 03/08/2015  Check In:     Session Check In - 03/08/15 0905    Check-In   Staff Present Candiss Norse MS, ACSM CEP Exercise Physiologist;Jaeli Grubb Mariana Arn, BSN   ER physicians immediately available to respond to emergencies See telemetry face sheet for immediately available ER MD   Medication changes reported     No   Fall or balance concerns reported    No   Warm-up and Cool-down Performed on first and last piece of equipment   VAD Patient? No   Pain Assessment   Currently in Pain? No/denies   Multiple Pain Sites No           Exercise Prescription Changes - 03/08/15 0900    Exercise Review   Progression Yes   Response to Exercise   Frequency Add 3 additional days to program exercise sessions.   Duration Progress to 50 minutes of aerobic without signs/symptoms of physical distress   Intensity Rest + 30   Progression Continue progressive overload as per policy without signs/symptoms or physical distress.   Resistance Training   Training Prescription Yes   Weight 3   Reps 10-15   Interval Training   Interval Training Yes   Equipment Treadmill   Comments 3.7/4%   Treadmill   MPH 3.7   Grade 4   Minutes 30   Elliptical   Level 3   Speed 4   Minutes 15   REL-XR   Level 7   Watts 100   Minutes 20      Goals Met:  Independence with exercise equipment Exercise tolerated well Personal goals reviewed No report of cardiac concerns or symptoms  Goals Unmet:  Not Applicable  Goals Comments:    Dr. Emily Filbert is Medical Director for [facility].

## 2015-03-21 ENCOUNTER — Encounter: Payer: Medicare Other | Attending: Internal Medicine | Admitting: *Deleted

## 2015-03-21 DIAGNOSIS — I214 Non-ST elevation (NSTEMI) myocardial infarction: Secondary | ICD-10-CM | POA: Diagnosis present

## 2015-03-21 NOTE — Progress Notes (Signed)
Daily Session Note  Patient Details  Name: Debra Moody MRN: [id number] Date of Birth: Jun 01, 1941 Referring Provider:  Rusty Aus, MD  Encounter Date: 03/21/2015  Check In:     Session Check In - 03/21/15 1729    Check-In   Staff Present Heath Lark RN, BSN, CCRP;Kelly Hayes BS, ACSM CEP Exercise Physiologist;Carroll Enterkin RN, BSN   ER physicians immediately available to respond to emergencies See telemetry face sheet for immediately available ER MD   Medication changes reported     No   Fall or balance concerns reported    No   Warm-up and Cool-down Performed on first and last piece of equipment   VAD Patient? No   Pain Assessment   Currently in Pain? No/denies   Multiple Pain Sites No         Goals Met:  Independence with exercise equipment Exercise tolerated well No report of cardiac concerns or symptoms Strength training completed today  Goals Unmet:  Not Applicable  Goals Comments: Completed program. Will continue exercise at local facility.   Dr. Emily Filbert is Medical Director for [facility].

## 2015-03-21 NOTE — Progress Notes (Signed)
Daily Session Note  Patient Details  Name: SEVANNAH MADIA MRN: [id number] Date of Birth: 01-21-1941 Referring Provider:  Rusty Aus, MD  Encounter Date: 03/21/2015  Check In:     Session Check In - 03/21/15 1729    Check-In   Staff Present Heath Lark RN, BSN, CCRP;Rebbeca Sheperd BS, ACSM CEP Exercise Physiologist;Carroll Enterkin RN, BSN   ER physicians immediately available to respond to emergencies See telemetry face sheet for immediately available ER MD   Medication changes reported     No   Fall or balance concerns reported    No   Warm-up and Cool-down Performed on first and last piece of equipment   VAD Patient? No   Pain Assessment   Currently in Pain? No/denies   Multiple Pain Sites No         Goals Met:  Independence with exercise equipment Exercise tolerated well No report of cardiac concerns or symptoms Strength training completed today  Goals Unmet:  Not Applicable  Goals Comments: Patient finished sessions today and was discharged from program.    Dr. Emily Filbert is Medical Director for [facility] and [facility].

## 2015-03-23 NOTE — Addendum Note (Signed)
Addended by: Gerlene Burdock on: 03/23/2015 01:19 PM   Modules accepted: Orders

## 2015-03-23 NOTE — Progress Notes (Signed)
Cardiac Individual Treatment Plan  Patient Details  Name: Debra Moody MRN: [id number] Date of Birth: May 14, 1941 Referring Provider:  Rusty Aus, MD  Initial Encounter Date:    Visit Diagnosis: No diagnosis found.  Patient's Home Medications on Admission:  Current outpatient prescriptions:  .  ALPRAZolam (XANAX) 0.25 MG tablet, Take by mouth., Disp: , Rfl:  .  aspirin 81 MG tablet, Take 81 mg by mouth daily., Disp: , Rfl:  .  Biotin 1000 MCG tablet, Take 1,000 mcg by mouth daily., Disp: , Rfl:  .  Calcium-Magnesium-Vitamin D (CALCIUM 500 PO), Take 1,000 mg by mouth daily., Disp: , Rfl:  .  carvedilol (COREG) 3.125 MG tablet, Take 1 tablet (3.125 mg total) by mouth 2 (two) times daily with a meal., Disp: 180 tablet, Rfl: 3 .  cetirizine (ZYRTEC) 10 MG tablet, Take 10 mg by mouth daily., Disp: , Rfl:  .  Cetirizine HCl 10 MG CAPS, Take 10 mg by mouth daily. , Disp: , Rfl:  .  Coenzyme Q10 (COQ10) 100 MG CAPS, Take 100 mg by mouth daily., Disp: , Rfl:  .  diphenhydrAMINE (BENADRYL) 25 mg capsule, Take 25 mg by mouth every 6 (six) hours as needed. , Disp: , Rfl:  .  ezetimibe (ZETIA) 10 MG tablet, Take 10 mg by mouth daily., Disp: , Rfl:  .  fexofenadine (ALLEGRA) 180 MG tablet, Take by mouth., Disp: , Rfl:  .  hyoscyamine (LEVSIN, ANASPAZ) 0.125 MG tablet, Take by mouth., Disp: , Rfl:  .  lisinopril (PRINIVIL,ZESTRIL) 2.5 MG tablet, Take 1 tablet (2.5 mg total) by mouth daily., Disp: 90 tablet, Rfl: 3 .  magnesium oxide (MAG-OX) 400 MG tablet, Take 400 mg by mouth daily., Disp: , Rfl:  .  Melatonin 5 MG TBDP, Take 5 mg by mouth as needed., Disp: , Rfl:  .  Omega-3 Fatty Acids (FISH OIL) 1000 MG CAPS, Take 1,200 mg by mouth daily. , Disp: , Rfl:  .  pantoprazole (PROTONIX) 40 MG tablet, Take 40 mg by mouth 2 (two) times daily., Disp: , Rfl:  .  Phenylephrine-Acetaminophen 5-325 MG TABS, Take by mouth as needed. , Disp: , Rfl:  .  Polyethylene Glycol 3350 (MIRALAX PO), Take by mouth  every evening., Disp: , Rfl:  .  Probiotic Product (PROBIOTIC DAILY PO), Take by mouth daily., Disp: , Rfl:  .  ranitidine (ZANTAC) 75 MG tablet, Take 150 mg by mouth 2 (two) times daily., Disp: , Rfl:  .  Simethicone 125 MG TABS, Take by mouth as needed. , Disp: , Rfl:  .  Vitamin D, Cholecalciferol, 400 UNITS CAPS, Take by mouth daily., Disp: , Rfl:   Past Medical History: Past Medical History  Diagnosis Date  . Osteoporosis   . Hypertension   . History of shingles 2015  . Diverticulitis   . 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 GERD (gastroesophageal reflux disease)   . Hyperlipidemia   . Skin cancer, basal cell     Tobacco Use: History  Smoking status  . Former Smoker -- 18 years  . Types: Cigarettes  . Quit date: 12/01/1999  Smokeless tobacco  . Not on file    Labs: Recent Review Flowsheet Data    There is no flowsheet data to display.       Exercise Target Goals:    Exercise Program Goal: Individual exercise prescription set with THRR, safety & activity barriers. Participant demonstrates ability to understand  and report RPE using BORG scale, to self-measure pulse accurately, and to acknowledge the importance of the exercise prescription.  Exercise Prescription Goal: Starting with aerobic activity 30 plus minutes a day, 3 days per week for initial exercise prescription. Provide home exercise prescription and guidelines that participant acknowledges understanding prior to discharge.  Activity Barriers & Risk Stratification:     Activity Barriers & Risk Stratification - 01/02/15 1158    Activity Barriers & Risk Stratification   Activity Barriers History of Falls   Risk Stratification High      6 Minute Walk:     6 Minute Walk      12/06/14 1200 02/03/15 1630     6 Minute Walk   Phase Initial Discharge    Distance 1910 feet 2200 feet    Walk Time 6 minutes 6 minutes    Resting HR 66 bpm 70 bpm    Resting BP  122/70 mmHg 144/80 mmHg    Max Ex. HR 130 bpm 99 bpm    Max Ex. BP 152/58 mmHg 212/64 mmHg    RPE 13 13    Perceived Dyspnea   2    Symptoms No No       Initial Exercise Prescription:   Exercise Prescription Changes:     Exercise Prescription Changes      12/29/14 0800 01/04/15 0800 01/12/15 0800 01/19/15 0900 01/31/15 1630   Exercise Review   Progression Yes Yes  Yes Yes   Response to Exercise   Blood Pressure (Admit)  102/64 mmHg   130/80 mmHg   Blood Pressure (Exercise)  124/60 mmHg   150/78 mmHg   Blood Pressure (Exit)  102/64 mmHg   94/60 mmHg   Heart Rate (Admit)  60 bpm   98 bpm   Heart Rate (Exercise)  98 bpm   122 bpm   Heart Rate (Exit)  78 bpm   90 bpm   Rating of Perceived Exertion (Exercise)  12   13   Frequency  Add 3 additional days to program exercise sessions.   Add 3 additional days to program exercise sessions.   Duration  Progress to 50 minutes of aerobic without signs/symptoms of physical distress   Progress to 50 minutes of aerobic without signs/symptoms of physical distress   Intensity  Rest + 30   Rest + 30   Progression  Continue progressive overload as per policy without signs/symptoms or physical distress.   Continue progressive overload as per policy without signs/symptoms or physical distress.   Resistance Training   Training Prescription  Yes   Yes   Weight  3   3   Reps  10-12   10-15   Interval Training   Interval Training  No Yes  Yes   Equipment   Treadmill  Treadmill   Comments   3.5/2%  3.7/2%   Treadmill   MPH  3.4   3.7   Grade  1   3   Minutes  30   30   Elliptical   Level $Remo'3 2  2 2   'QsNaj$ Speed  $Remo'3  4 4   'DtcyS$ Minutes  15   15   REL-XR   Level     7   Watts     90   Minutes     20     02/21/15 0700 03/08/15 0900         Exercise Review   Progression Yes Yes      Response to Exercise  Blood Pressure (Admit) 144/80 mmHg       Blood Pressure (Exercise) 212/64 mmHg       Blood Pressure (Exit) 116/74 mmHg       Heart Rate (Admit) 70  bpm       Heart Rate (Exercise) 126 bpm       Heart Rate (Exit) 88 bpm       Rating of Perceived Exertion (Exercise) 13       Frequency Add 3 additional days to program exercise sessions. Add 3 additional days to program exercise sessions.      Duration Progress to 50 minutes of aerobic without signs/symptoms of physical distress Progress to 50 minutes of aerobic without signs/symptoms of physical distress      Intensity Rest + 30 Rest + 30      Progression Continue progressive overload as per policy without signs/symptoms or physical distress. Continue progressive overload as per policy without signs/symptoms or physical distress.      Resistance Training   Training Prescription Yes Yes      Weight 3 3      Reps 10-15 10-15      Interval Training   Interval Training Yes Yes      Equipment Treadmill Treadmill      Comments 3.7/4% 3.7/4%      Treadmill   MPH 3.7 3.7      Grade 4 4      Minutes 30 30      Elliptical   Level 2 3      Speed 4 4      Minutes 15 15      REL-XR   Level 7 7      Watts 100 100      Minutes 20 20         Discharge Exercise Prescription (Final Exercise Prescription Changes):     Exercise Prescription Changes - 03/08/15 0900    Exercise Review   Progression Yes   Response to Exercise   Frequency Add 3 additional days to program exercise sessions.   Duration Progress to 50 minutes of aerobic without signs/symptoms of physical distress   Intensity Rest + 30   Progression Continue progressive overload as per policy without signs/symptoms or physical distress.   Resistance Training   Training Prescription Yes   Weight 3   Reps 10-15   Interval Training   Interval Training Yes   Equipment Treadmill   Comments 3.7/4%   Treadmill   MPH 3.7   Grade 4   Minutes 30   Elliptical   Level 3   Speed 4   Minutes 15   REL-XR   Level 7   Watts 100   Minutes 20      Nutrition:  Target Goals: Understanding of nutrition guidelines, daily intake of  sodium '1500mg'$ , cholesterol '200mg'$ , calories 30% from fat and 7% or less from saturated fats, daily to have 5 or more servings of fruits and vegetables.  Biometrics:     Pre Biometrics - 12/06/14 1201    Pre Biometrics   Height $Remov'5\' 3"'jzTNZa$  (1.6 m)   Weight 110 lb (49.896 kg)   Waist Circumference 27.5 inches   Hip Circumference 34.5 inches   Waist to Hip Ratio 0.8 %   BMI (Calculated) 19.5         Post Biometrics - 02/03/15 1631     Post  Biometrics   Height $Remov'5\' 3"'BylAam$  (1.6 m)   Weight 108 lb 4.8 oz (49.125 kg)  Waist Circumference 27.5 inches   Hip Circumference 36 inches   Waist to Hip Ratio 0.76 %   BMI (Calculated) 19.2      Nutrition Therapy Plan and Nutrition Goals:   Nutrition Discharge: Rate Your Plate Scores:     Rate Your Plate - 96/22/29 7989    Rate Your Plate Scores   Post Score 77   Post Score % 85 %      Nutrition Goals Re-Evaluation:     Nutrition Goals Re-Evaluation      01/03/15 0934 01/24/15 1734 02/25/15 1547       Personal Goal #1 Re-Evaluation   Personal Goal #1 Leandro Reasoner would prefer not to meet individually with the dietician. Sylvanna eats walnuts on her cereal. She also does not like Ensure to put back on her weight but eats a teaspoon of peanut butter periodically. Jeslie would prefer not to meet individually with the dietician. Elleen eats walnuts on her cereal. She also does not like Ensure to put back on her weight but eats a teaspoon of peanut butter periodically.      Goal Progress Seen Yes  Yes     Comments   Eats healthy already. She is still training to gain her weight back.      Personal Goal #2 Re-Evaluation   Personal Goal #2  To meet with RD next week         Psychosocial: Target Goals: Acknowledge presence or absence of depression, maximize coping skills, provide positive support system. Participant is able to verbalize types and ability to use techniques and skills needed for reducing stress and depression.  Initial Review & Psychosocial  Screening:     Initial Psych Review & Screening - 12/22/14 1008    Initial Review   Current issues with Current Sleep Concerns;Current Stress Concerns   Comments Ms. 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   Family Dynamics   Good Support System? Yes   Barriers   Psychosocial barriers to participate in program There are no identifiable barriers or psychosocial needs.;The patient should benefit from training in stress management and relaxation.   Screening Interventions   Interventions Encouraged to exercise      Quality of Life Scores:     Quality of Life - 02/04/15 1334    Quality of Life Scores   Health/Function Post 27.9 %   Socioeconomic Post 28 %   Psych/Spiritual Post 25.29 %   Family Post 26.1 %   GLOBAL Post 27.09 %      PHQ-9:     Recent Review Flowsheet Data    Depression screen [facility] 2/9 02/04/2015   Decreased Interest 0   Down, Depressed, Hopeless 1   PHQ - 2 Score 1   Altered sleeping 2   Tired, decreased energy 1   Change in appetite 0   Feeling bad or failure about yourself  0   Trouble concentrating 0   Moving slowly or fidgety/restless 0   Suicidal thoughts 0   PHQ-9 Score 4      Psychosocial Evaluation and Intervention:     Psychosocial Evaluation - 12/22/14 1010    Psychosocial Evaluation & Interventions   Interventions Relaxation education;Stress management education;Encouraged to exercise with the program and follow exercise prescription   Comments Counselor met with Ms. Ferrando today for initial psychosocial evaluation.  She is a 74 year old well-adjusted female who had a heart attack while on vacation in March of this year.  She has a  good support system with a spouse and close friends and is involved in her faith community.  Ms. Hillenburg denies a history of depression or current symptoms, but has a great deal of stress in her life.  Her spouse has multiple health issues with lung and pancreatic cancer, and Ms. Keach is the primary  caregiver for him.  She also just lost her mother in the past year and this was a significant loss to her.  Ms. Lill has been a person who exercises consistently, but she does recognize the need to learn new ways to cope with stress and learn to relax.  She has some difficulty sleeping and has a limited appetite. Counselor shared some natural options for sleep to consider with her pharmacist.   Counselor discussed and practiced breathing exercises and meditation with Ms. Peyser and will continue to follow with her about this as needed.   Continued Psychosocial Services Needed --  Ms. Gashi will benefit from all of the psychoeducational components of this program, especially stress management and learning new ways to relax.        Psychosocial Re-Evaluation:   Vocational Rehabilitation: Provide vocational rehab assistance to qualifying candidates.   Vocational Rehab Evaluation & Intervention:     Vocational Rehab - 01/30/15 0944    Initial Vocational Rehab Evaluation & Intervention   Assessment shows need for Vocational Rehabilitation No      Education: Education Goals: Education classes will be provided on a weekly basis, covering required topics. Participant will state understanding/return demonstration of topics presented.  Learning Barriers/Preferences:     Learning Barriers/Preferences - 12/06/14 1159    Learning Barriers/Preferences   Learning Barriers None   Learning Preferences Group Instruction;Written Material;Video      Education Topics: General Nutrition Guidelines/Fats and Fiber: -Group instruction provided by verbal, written material, models and posters to present the general guidelines for heart healthy nutrition. Gives an explanation and review of dietary fats and fiber.          Cardiac Rehab from 03/21/2015 in [facility] Cardiac Rehab   Date  01/31/15   Educator  CR   Instruction Review Code  2- meets goals/outcomes      Controlling Sodium/Reading Food  Labels: -Group verbal and written material supporting the discussion of sodium use in heart healthy nutrition. Review and explanation with models, verbal and written materials for utilization of the food label.      Cardiac Rehab from 03/21/2015 in [facility] Cardiac Rehab   Date  12/20/14   Educator  CR   Instruction Review Code  2- meets goals/outcomes      Exercise Physiology & Risk Factors: - Group verbal and written instruction with models to review the exercise physiology of the cardiovascular system and associated critical values. Details cardiovascular disease risk factors and the goals associated with each risk factor.      Cardiac Rehab from 03/21/2015 in [facility] Cardiac Rehab   Date  12/29/14   Educator  [place] [facility]   Instruction Review Code  2- meets goals/outcomes      Aerobic Exercise & Resistance Training: - Gives group verbal and written discussion on the health impact of inactivity. On the components of aerobic and resistive training programs and the benefits of this training and how to safely progress through these programs.      Cardiac Rehab from 03/21/2015 in [facility] Cardiac Rehab   Date  02/28/15   Educator  RM   Instruction Review Code  2- meets goals/outcomes  Flexibility, Balance, General Exercise Guidelines: - Provides group verbal and written instruction on the benefits of flexibility and balance training programs. Provides general exercise guidelines with specific guidelines to those with heart or lung disease. Demonstration and skill practice provided.      Cardiac Rehab from 03/21/2015 in [facility] Cardiac Rehab   Date  01/05/15   Educator  Rm   Instruction Review Code  2- meets goals/outcomes      Stress Management: - Provides group verbal and written instruction about the health risks of elevated stress, cause of high stress, and healthy ways to reduce stress.      Cardiac Rehab from 03/21/2015 in [facility] Cardiac Rehab   Date  01/12/15   Educator  [facility]    Instruction Review Code  2- meets goals/outcomes      Depression: - Provides group verbal and written instruction on the correlation between heart/lung disease and depressed mood, treatment options, and the stigmas associated with seeking treatment.      Cardiac Rehab from 03/21/2015 in [facility] Cardiac Rehab   Date  12/22/14   Educator  [facility]   Instruction Review Code  2- meets goals/outcomes      Anatomy & Physiology of the Heart: - Group verbal and written instruction and models provide basic cardiac anatomy and physiology, with the coronary electrical and arterial systems. Review of: AMI, Angina, Valve disease, Heart Failure, Cardiac Arrhythmia, Pacemakers, and the ICD.      Cardiac Rehab from 03/21/2015 in [facility] Cardiac Rehab   Date  01/17/15   Educator  [facility]   Instruction Review Code  2- meets goals/outcomes      Cardiac Procedures: - Group verbal and written instruction and models to describe the testing methods done to diagnose heart disease. Reviews the outcomes of the test results. Describes the treatment choices: Medical Management, Angioplasty, or Coronary Bypass Surgery.   Cardiac Medications: - Group verbal and written instruction to review commonly prescribed medications for heart disease. Reviews the medication, class of the drug, and side effects. Includes the steps to properly store meds and maintain the prescription regimen.      Cardiac Rehab from 03/21/2015 in [facility] Cardiac Rehab   Date  12/27/14   Educator  SB   Instruction Review Code  2- meets goals/outcomes      Go Sex-Intimacy & Heart Disease, Get SMART - Goal Setting: - Group verbal and written instruction through game format to discuss heart disease and the return to sexual intimacy. Provides group verbal and written material to discuss and apply goal setting through the application of the S.M.A.R.T. Method.   Other Matters of the Heart: - Provides group verbal, written materials and models to describe Heart  Failure, Angina, Valve Disease, and Diabetes in the realm of heart disease. Includes description of the disease process and treatment options available to the cardiac patient.   Exercise & Equipment Safety: - Individual verbal instruction and demonstration of equipment use and safety with use of the equipment.   Infection Prevention: - Provides verbal and written material to individual with discussion of infection control including proper hand washing and proper equipment cleaning during exercise session.   Falls Prevention: - Provides verbal and written material to individual with discussion of falls prevention and safety.   Diabetes: - Individual verbal and written instruction to review signs/symptoms of diabetes, desired ranges of glucose level fasting, after meals and with exercise. Advice that pre and post exercise glucose checks will be done for 3 sessions at entry  of program.    Knowledge Questionnaire Score:     Knowledge Questionnaire Score - 02/04/15 1334    Knowledge Questionnaire Score   Post Score 26      Personal Goals and Risk Factors at Admission:     Personal Goals and Risk Factors at Admission - 01/02/15 1202    Personal Goals and Risk Factors on Admission    Weight Management Yes   Intervention Learn and follow the exercise and diet guidelines while in the program. Utilize the nutrition and education classes to help gain knowledge of the diet and exercise expectations in the program   Increase Aerobic Exercise and Physical Activity Yes   Intervention While in program, learn and follow the exercise prescription taught. Start at a low level workload and increase workload after able to maintain previous level for 30 minutes. Increase time before increasing intensity.      Personal Goals and Risk Factors Review:      Goals and Risk Factor Review      01/03/15 0935 01/14/15 1255 01/24/15 1735 01/30/15 0939 02/25/15 1547   Weight Management   Goals  Progress/Improvement seen   Yes     Comments   Maintaining weight     Increase Aerobic Exercise and Physical Activity   Goals Progress/Improvement seen  Yes Yes Yes Yes Yes   Comments "I feel so much better since I got back in this gym". "Cardiac Rehab has helped me." Is on Elliptical for 10-15 minutes! Feels the most challenged on the elliptical and has seen some encouraging progression since starting rehab. She is currently walking on almost every day of the week, including days she attends rehab. She says exercise will remain a part of her lifestyle.    Hopes to return to Cardiac REhab this Monday. Has been walking a little bit.       Personal Goals Discharge:      Personal Goals at Discharge - 03/23/15 1313    Weight Management   Goals Progress/Improvement seen No   Comments Still 110lbs wants to gain weight   Increase Aerobic Exercise and Physical Activity   Goals Progress/Improvement seen  Yes       Comments: Discharged after 36/36 session of Cardiac Rehab. Did well.

## 2015-03-23 NOTE — Progress Notes (Addendum)
Discharge Summary  Patient Details  Name: Debra Moody MRN: [id number] Date of Birth: Feb 02, 1941 Referring Provider:  Rusty Aus, MD   Number of Visits: 36  Reason for Discharge:  Patient reached a stable level of exercise. Patient independent in their exercise.  Smoking History:  History  Smoking status  . Former Smoker -- 18 years  . Types: Cigarettes  . Quit date: 12/01/1999  Smokeless tobacco  . Not on file    Diagnosis:  No diagnosis found.  ADL UCSD:   Initial Exercise Prescription:   Discharge Exercise Prescription (Final Exercise Prescription Changes):     Exercise Prescription Changes - 03/08/15 0900    Exercise Review   Progression Yes   Response to Exercise   Frequency Add 3 additional days to program exercise sessions.   Duration Progress to 50 minutes of aerobic without signs/symptoms of physical distress   Intensity Rest + 30   Progression Continue progressive overload as per policy without signs/symptoms or physical distress.   Resistance Training   Training Prescription Yes   Weight 3   Reps 10-15   Interval Training   Interval Training Yes   Equipment Treadmill   Comments 3.7/4%   Treadmill   MPH 3.7   Grade 4   Minutes 30   Elliptical   Level 3   Speed 4   Minutes 15   REL-XR   Level 7   Watts 100   Minutes 20      Functional Capacity:     6 Minute Walk      12/06/14 1200 02/03/15 1630     6 Minute Walk   Phase Initial Discharge    Distance 1910 feet 2200 feet    Walk Time 6 minutes 6 minutes    Resting HR 66 bpm 70 bpm    Resting BP 122/70 mmHg 144/80 mmHg    Max Ex. HR 130 bpm 99 bpm    Max Ex. BP 152/58 mmHg 212/64 mmHg    RPE 13 13    Perceived Dyspnea   2    Symptoms No No       Psychological, QOL, Others - Outcomes: PHQ 2/9: Depression screen PHQ 2/9 02/04/2015  Decreased Interest 0  Down, Depressed, Hopeless 1  PHQ - 2 Score 1  Altered sleeping 2  Tired, decreased energy 1  Change in appetite 0   Feeling bad or failure about yourself  0  Trouble concentrating 0  Moving slowly or fidgety/restless 0  Suicidal thoughts 0  PHQ-9 Score 4    Quality of Life:     Quality of Life - 02/04/15 1334    Quality of Life Scores   Health/Function Post 27.9 %   Socioeconomic Post 28 %   Psych/Spiritual Post 25.29 %   Family Post 26.1 %   GLOBAL Post 27.09 %      Personal Goals: Goals established at orientation with interventions provided to work toward goal.     Personal Goals and Risk Factors at Admission - 01/02/15 1202    Personal Goals and Risk Factors on Admission    Weight Management Yes   Intervention Learn and follow the exercise and diet guidelines while in the program. Utilize the nutrition and education classes to help gain knowledge of the diet and exercise expectations in the program   Increase Aerobic Exercise and Physical Activity Yes   Intervention While in program, learn and follow the exercise prescription taught. Start at a low level workload and  increase workload after able to maintain previous level for 30 minutes. Increase time before increasing intensity.       Personal Goals Discharge:     Personal Goals at Discharge - 03/23/15 1313    Weight Management   Goals Progress/Improvement seen No   Comments Still 110lbs wants to gain weight   Increase Aerobic Exercise and Physical Activity   Goals Progress/Improvement seen  Yes      Nutrition & Weight - Outcomes:     Pre Biometrics - 12/06/14 1201    Pre Biometrics   Height 5\' 3"  (1.6 m)   Weight 110 lb (49.896 kg)   Waist Circumference 27.5 inches   Hip Circumference 34.5 inches   Waist to Hip Ratio 0.8 %   BMI (Calculated) 19.5         Post Biometrics - 02/03/15 1631     Post  Biometrics   Height 5\' 3"  (1.6 m)   Weight 108 lb 4.8 oz (49.125 kg)   Waist Circumference 27.5 inches   Hip Circumference 36 inches   Waist to Hip Ratio 0.76 %   BMI (Calculated) 19.2       Nutrition:   Nutrition Discharge:     Rate Your Plate - 00/17/49 4496    Rate Your Plate Scores   Post Score 77   Post Score % 85 %      Education Questionnaire Score:     Knowledge Questionnaire Score - 02/04/15 1334    Knowledge Questionnaire Score   Post Score 26      Goals reviewed with patient; copy given to patient.

## 2015-03-29 ENCOUNTER — Telehealth: Payer: Self-pay | Admitting: *Deleted

## 2015-03-29 NOTE — Telephone Encounter (Signed)
S/w pt who states she had "spasms" last night in center of chest, radiating to the right side of chest. Lasted 9pm-1am. Was sore when she pressed between her breast but denies pain this morning.  Denies nausea, sweating or left arm pain. States she took "several" simethicone pills as well as zantac, 1/2 hydrocodone  and xanax. Reports having stopped pantoprazole one month ago but took it again last night.  Pain subsided and she reports being able to go back to sleep.  Has not had any dietary changes. Advised pt to continue to monitor and go to ED if CP, left arm pain, nausea, sweating Pt verbalized understanding and states she called because Dr. Rockey Situ told her to call if she experienced any of the GERD type symptoms. Will forward to [place]

## 2015-03-29 NOTE — Telephone Encounter (Signed)
Monitor symptoms and if this becomes more frequent, resume Protonix.

## 2015-03-29 NOTE — Telephone Encounter (Signed)
Pt c/o of Chest Pain: STAT if CP now or developed within 24 hours  1. Are you having CP right now? No   2. Are you experiencing any other symptoms (ex. SOB, nausea, vomiting, sweating)? no  3. How long have you been experiencing CP? Just last night  4. Is your CP continuous or coming and going? Both   5. Have you taken Nitroglycerin? No, pt does not have any.  ? Pt calling stating that she is not sure if last night was reflux or chest pain. It went on for 9pm to 1am  She took some reflux medication and it did not work as much.

## 2015-03-29 NOTE — Telephone Encounter (Signed)
S/w pt regarding Dr. Tyrell Antonio recommendations. Pt verbalized understanding and will continue to monitor symptoms.

## 2015-03-30 ENCOUNTER — Other Ambulatory Visit: Payer: Self-pay | Admitting: *Deleted

## 2015-03-30 DIAGNOSIS — I214 Non-ST elevation (NSTEMI) myocardial infarction: Secondary | ICD-10-CM

## 2015-09-05 ENCOUNTER — Ambulatory Visit (INDEPENDENT_AMBULATORY_CARE_PROVIDER_SITE_OTHER): Payer: Medicare Other | Admitting: Cardiovascular Disease

## 2015-09-05 ENCOUNTER — Encounter: Payer: Self-pay | Admitting: Cardiovascular Disease

## 2015-09-05 VITALS — BP 120/54 | HR 63 | Ht 63.0 in | Wt 110.5 lb

## 2015-09-05 DIAGNOSIS — E785 Hyperlipidemia, unspecified: Secondary | ICD-10-CM

## 2015-09-05 DIAGNOSIS — I951 Orthostatic hypotension: Secondary | ICD-10-CM

## 2015-09-05 DIAGNOSIS — I420 Dilated cardiomyopathy: Secondary | ICD-10-CM

## 2015-09-05 MED ORDER — CARVEDILOL 3.125 MG PO TABS: 3.1250 mg | ORAL_TABLET | Freq: Two times a day (BID) | ORAL | Status: DC

## 2015-09-05 MED ORDER — LISINOPRIL 2.5 MG PO TABS: 2.5000 mg | ORAL_TABLET | Freq: Every day | ORAL | Status: DC

## 2015-09-05 NOTE — Assessment & Plan Note (Signed)
Tolerating Zetia daily, unable to tolerate a statin

## 2015-09-05 NOTE — Patient Instructions (Signed)
You are doing well. No medication changes were made.  Please call us if you have new issues that need to be addressed before your next appt.  Your physician wants you to follow-up in: 12 months.  You will receive a reminder letter in the mail two months in advance. If you don't receive a letter, please call our office to schedule the follow-up appointment. 

## 2015-09-05 NOTE — Progress Notes (Signed)
Patient ID: Debra Moody, female    DOB: 07/22/1941, 75 y.o.   MRN: [id number]  HPI Comments: Debra Moody is a pleasant 75 year old woman with previous history of GERD, diverticulitis, hysterectomy in the 80s, prior bladder surgery, elbow fracture October 2014, shingles September 2015, previously seen for chest pain, elevated cardiac enzymes, stress related cardiomyopathy. She presents today for follow-up of her cardiomyopathy  In follow-up today, she reports that she has had recent stress Left side chest tightness, increased her carvedilol up to 3.125 mill grams twice a day for the past week Feels this has helped her symptoms Previously had to decrease the dose for low blood pressure  Lab work reviewed with her in detail from Dr. Emily Filbert Previous echocardiogram again reviewed with her showing normal ejection fraction  No episodes of near syncope or syncope  EKG on today's visit shows normal sinus rhythm with rate 63 bpm, no significant ST or T-wave changes  Other past medical history  she was traveling out of state, was in [place] when she developed amnesia type symptoms. Her husband took her to the hospital. She had MRI and CT of the head that showed no stroke. Further workup showed abnormal EKG, abnormal echocardiogram, elevated troponin up to more than 3. Ischemia could not be excluded and she was taken to the cardiac catheterization lab 11/03/2014. 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 Alendronate Sodium  Other (See Comments)  . Ibandronic Acid Other (See Comments)  . Simvastatin Other (See Comments)    Myalgia.  Marland Kitchen Amoxicillin-Pot Clavulanate Rash    Outpatient Encounter Prescriptions as of 09/05/2015  Medication Sig  . ALPRAZolam (XANAX) 0.25 MG tablet Take 0.25 mg by mouth at bedtime.   Marland Kitchen aspirin 81 MG tablet Take 81 mg by mouth daily.  . Biotin 1000 MCG tablet Take 1,000 mcg by mouth daily.  . Calcium-Magnesium-Vitamin D (CALCIUM 500 PO) Take 1,000 mg by mouth daily.  . carvedilol (COREG) 3.125 MG tablet Take 1 tablet (3.125 mg total) by mouth 2 (two) times daily with a meal.  . cetirizine (ZYRTEC) 10 MG tablet Take 10 mg by mouth daily.  . Coenzyme Q10 (COQ10) 100 MG CAPS Take 100 mg by mouth daily.  . diphenhydrAMINE (BENADRYL) 25 mg capsule Take 25 mg by mouth every 6 (six) hours as needed.   . docusate sodium (COLACE) 100 MG capsule Take 100 mg by mouth 2 (two) times daily.  Marland Kitchen doxycycline (VIBRAMYCIN) 100 MG capsule Take 100 mg by mouth 2 (two) times daily.  Marland Kitchen ezetimibe (ZETIA) 10 MG tablet Take 10 mg by mouth daily.  . fexofenadine (ALLEGRA) 180 MG tablet Take by mouth.  . Gabapentin, Once-Daily, 300 MG TABS Take 300 mg by mouth 3 (three) times daily.  Marland Kitchen lisinopril (PRINIVIL,ZESTRIL) 2.5 MG tablet Take 1 tablet (2.5 mg total) by mouth daily.  . magnesium oxide (MAG-OX) 400 MG tablet Take 400 mg by mouth daily.  . Melatonin 5 MG TBDP Take 5 mg by mouth as needed.  . Omega-3 Fatty Acids (FISH OIL) 1000 MG CAPS  Take 1,200 mg by mouth daily.   Marland Kitchen Phenylephrine-Acetaminophen 5-325 MG TABS Take by mouth as needed.   . Polyethylene Glycol 3350 (MIRALAX PO) Take by mouth every evening.  . Probiotic Product (PROBIOTIC DAILY PO) Take by mouth daily.  . Simethicone 125 MG TABS Take by mouth as needed.   . Vitamin D, Cholecalciferol, 400 UNITS CAPS Take by mouth daily.  . [DISCONTINUED] carvedilol (COREG) 3.125 MG tablet Take 1 tablet (3.125 mg total) by mouth 2 (two) times daily with a meal.   . [DISCONTINUED] lisinopril (PRINIVIL,ZESTRIL) 2.5 MG tablet Take 1 tablet (2.5 mg total) by mouth daily.  . [DISCONTINUED] Cetirizine HCl 10 MG CAPS Take 10 mg by mouth daily. Reported on 09/05/2015  . [DISCONTINUED] hyoscyamine (LEVSIN, ANASPAZ) 0.125 MG tablet Take by mouth. Reported on 09/05/2015  . [DISCONTINUED] pantoprazole (PROTONIX) 40 MG tablet Take 40 mg by mouth 2 (two) times daily. Reported on 09/05/2015  . [DISCONTINUED] ranitidine (ZANTAC) 75 MG tablet Take 150 mg by mouth 2 (two) times daily. Reported on 09/05/2015   No facility-administered encounter medications on file as of 09/05/2015.    Past Medical History  Diagnosis Date  . Osteoporosis   . Hypertension   . History of shingles 2015  . Diverticulitis   . MI (myocardial infarction) ([place]) 11/03/2014    Severe damage to the left ventricle   . GERD (gastroesophageal reflux disease)   . Hyperlipidemia   . Skin cancer, basal cell     Past Surgical History  Procedure Laterality Date  . Abdominal hysterectomy    . Gum surgery    . Cardiac catheterization  11/03/2014    Social History  reports that she quit smoking about 15 years ago. Her smoking use included Cigarettes. She quit after 18 years of use. She does not have any smokeless tobacco history on file. She reports that she drinks alcohol. She reports that she does not use illicit drugs.  Family History family history includes Heart attack in her mother; Heart attack (age of onset: 101) in her father; Heart disease in her father and mother.  Review of Systems  Constitutional: Negative.   Respiratory: Negative.   Cardiovascular: Negative.   Gastrointestinal: Negative.   Musculoskeletal: Negative.   Skin: Negative.   Neurological: Negative.   Hematological: Negative.   Psychiatric/Behavioral: Negative.   All other systems reviewed and are negative.   BP 120/54 mmHg  Pulse 63  Ht 5\' 3"  (1.6 m)  Wt 110 lb 8 oz (50.122 kg)  BMI 19.58 kg/m2  Physical Exam   Constitutional: She is oriented to person, place, and time. She appears well-developed and well-nourished.  HENT:  Head: Normocephalic.  Nose: Nose normal.  Mouth/Throat: Oropharynx is clear and moist.  Eyes: Conjunctivae are normal. Pupils are equal, round, and reactive to light.  Neck: Normal range of motion. Neck supple. No JVD present.  Cardiovascular: Normal rate, regular rhythm, S1 normal, S2 normal, normal heart sounds and intact distal pulses.  Exam reveals no gallop and no friction rub.   No murmur heard. Pulmonary/Chest: Effort normal and breath sounds normal. No respiratory distress. She has no wheezes. She has no rales. She exhibits no tenderness.  Abdominal: Soft. Bowel sounds are normal. She exhibits no distension. There is no tenderness.  Musculoskeletal: Normal range of motion. She exhibits no edema or tenderness.  Lymphadenopathy:    She has no cervical adenopathy.  Neurological: She is alert and oriented to person, place, and time. Coordination normal.  Skin: Skin is  warm and dry. No rash noted. No erythema.  Psychiatric: She has a normal mood and affect. Her behavior is normal. Judgment and thought content normal.    Assessment and Plan  Nursing note and vitals reviewed.

## 2015-09-05 NOTE — Assessment & Plan Note (Signed)
Active, no concerns for heart failure based on clinical exam and symptoms. No further testing at this time She is tolerating Coreg 3.125 mill grams daily, low-dose lisinopril Vague symptoms in her left chest, of these symptoms get worse could repeat echocardiogram

## 2015-09-12 ENCOUNTER — Other Ambulatory Visit: Payer: Self-pay | Admitting: Internal Medicine

## 2015-09-12 DIAGNOSIS — Z1231 Encounter for screening mammogram for malignant neoplasm of breast: Secondary | ICD-10-CM

## 2015-11-14 ENCOUNTER — Encounter (INDEPENDENT_AMBULATORY_CARE_PROVIDER_SITE_OTHER): Payer: Self-pay | Admitting: Internal Medicine

## 2016-02-06 ENCOUNTER — Ambulatory Visit: Payer: Medicare Other

## 2016-02-10 ENCOUNTER — Other Ambulatory Visit: Payer: Self-pay | Admitting: Internal Medicine

## 2016-02-10 ENCOUNTER — Ambulatory Visit
Admission: RE | Admit: 2016-02-10 | Discharge: 2016-02-10 | Disposition: A | Discharge status: Home / Self Care | Payer: Medicare Other | Source: Ambulatory Visit | Attending: Internal Medicine | Admitting: Internal Medicine

## 2016-02-10 DIAGNOSIS — Z1231 Encounter for screening mammogram for malignant neoplasm of breast: Secondary | ICD-10-CM

## 2016-07-13 ENCOUNTER — Emergency Department: Payer: Medicare Other

## 2016-07-13 ENCOUNTER — Encounter: Payer: Self-pay | Admitting: Emergency Medicine

## 2016-07-13 ENCOUNTER — Emergency Department
Admission: EM | Admit: 2016-07-13 | Discharge: 2016-07-13 | Disposition: A | Discharge status: Home / Self Care | Payer: Medicare Other | Attending: Student in an Organized Health Care Education/Training Program | Admitting: Student in an Organized Health Care Education/Training Program

## 2016-07-13 DIAGNOSIS — K5732 Diverticulitis of large intestine without perforation or abscess without bleeding: Secondary | ICD-10-CM | POA: Diagnosis not present

## 2016-07-13 DIAGNOSIS — I10 Essential (primary) hypertension: Secondary | ICD-10-CM | POA: Diagnosis not present

## 2016-07-13 DIAGNOSIS — I2510 Atherosclerotic heart disease of native coronary artery without angina pectoris: Secondary | ICD-10-CM | POA: Insufficient documentation

## 2016-07-13 DIAGNOSIS — Z87891 Personal history of nicotine dependence: Secondary | ICD-10-CM | POA: Insufficient documentation

## 2016-07-13 DIAGNOSIS — R1032 Left lower quadrant pain: Secondary | ICD-10-CM

## 2016-07-13 DIAGNOSIS — Z7982 Long term (current) use of aspirin: Secondary | ICD-10-CM | POA: Diagnosis not present

## 2016-07-13 DIAGNOSIS — Z79899 Other long term (current) drug therapy: Secondary | ICD-10-CM | POA: Insufficient documentation

## 2016-07-13 DIAGNOSIS — Z85828 Personal history of other malignant neoplasm of skin: Secondary | ICD-10-CM | POA: Diagnosis not present

## 2016-07-13 LAB — URINALYSIS COMPLETE WITH MICROSCOPIC (ARMC ONLY)
Bacteria, UA: NONE SEEN
Bilirubin Urine: NEGATIVE
Glucose, UA: NEGATIVE mg/dL
Ketones, ur: NEGATIVE mg/dL
Nitrite: NEGATIVE
Protein, ur: NEGATIVE mg/dL
Specific Gravity, Urine: 1.018 (ref 1.005–1.030)
pH: 5 (ref 5.0–8.0)

## 2016-07-13 LAB — CBC
HCT: 41.5 % (ref 35.0–47.0)
Hemoglobin: 13.9 g/dL (ref 12.0–16.0)
MCH: 28.7 pg (ref 26.0–34.0)
MCHC: 33.6 g/dL (ref 32.0–36.0)
MCV: 85.5 fL (ref 80.0–100.0)
Platelets: 213 10*3/uL (ref 150–440)
RBC: 4.85 MIL/uL (ref 3.80–5.20)
RDW: 13.7 % (ref 11.5–14.5)
WBC: 8.4 10*3/uL (ref 3.6–11.0)

## 2016-07-13 LAB — COMPREHENSIVE METABOLIC PANEL
ALT: 14 U/L (ref 14–54)
AST: 18 U/L (ref 15–41)
Albumin: 3.9 g/dL (ref 3.5–5.0)
Alkaline Phosphatase: 46 U/L (ref 38–126)
Anion gap: 6 (ref 5–15)
BUN: 15 mg/dL (ref 6–20)
CO2: 25 mmol/L (ref 22–32)
Calcium: 8.6 mg/dL — ABNORMAL LOW (ref 8.9–10.3)
Chloride: 100 mmol/L — ABNORMAL LOW (ref 101–111)
Creatinine, Ser: 0.72 mg/dL (ref 0.44–1.00)
GFR calc Af Amer: >60 mL/min (ref >60)
GFR calc non Af Amer: >60 mL/min (ref >60)
Glucose, Bld: 103 mg/dL — ABNORMAL HIGH (ref 65–99)
Potassium: 4.4 mmol/L (ref 3.5–5.1)
Sodium: 131 mmol/L — ABNORMAL LOW (ref 135–145)
Total Bilirubin: 1 mg/dL (ref 0.3–1.2)
Total Protein: 6.8 g/dL (ref 6.5–8.1)

## 2016-07-13 LAB — LIPASE, BLOOD: Lipase: 28 U/L (ref 11–51)

## 2016-07-13 MED ORDER — CIPROFLOXACIN HCL 500 MG PO TABS: 500.0000 mg | ORAL_TABLET | Freq: Two times a day (BID) | ORAL | 0 refills | Status: AC

## 2016-07-13 MED ORDER — FENTANYL CITRATE (PF) 100 MCG/2ML IJ SOLN
50.0000 ug | INTRAMUSCULAR | Status: DC | PRN
  Administered 2016-07-13: 50 ug via INTRAVENOUS
  Filled 2016-07-13: qty 2

## 2016-07-13 MED ORDER — METRONIDAZOLE 500 MG PO TABS: 500.0000 mg | ORAL_TABLET | Freq: Three times a day (TID) | ORAL | 0 refills | Status: AC

## 2016-07-13 MED ORDER — ONDANSETRON HCL 4 MG/2ML IJ SOLN
4.0000 mg | Freq: Once | INTRAMUSCULAR | Status: AC
  Administered 2016-07-13: 4 mg via INTRAVENOUS
  Filled 2016-07-13: qty 2

## 2016-07-13 MED ORDER — METRONIDAZOLE 500 MG PO TABS
500.0000 mg | ORAL_TABLET | Freq: Once | ORAL | Status: AC
  Administered 2016-07-13: 500 mg via ORAL
  Filled 2016-07-13: qty 1

## 2016-07-13 MED ORDER — HYDROCODONE-ACETAMINOPHEN 5-325 MG PO TABS: 1.0000 | ORAL_TABLET | ORAL | 0 refills | Status: DC | PRN

## 2016-07-13 MED ORDER — CIPROFLOXACIN HCL 500 MG PO TABS
500.0000 mg | ORAL_TABLET | Freq: Once | ORAL | Status: AC
  Administered 2016-07-13: 500 mg via ORAL
  Filled 2016-07-13: qty 1

## 2016-07-13 MED ORDER — SODIUM CHLORIDE 0.9 % IV BOLUS (SEPSIS)
1000.0000 mL | Freq: Once | INTRAVENOUS | Status: AC
  Administered 2016-07-13: 1000 mL via INTRAVENOUS

## 2016-07-13 MED ORDER — IOPAMIDOL (ISOVUE-300) INJECTION 61%
100.0000 mL | Freq: Once | INTRAVENOUS | Status: AC | PRN
  Administered 2016-07-13: 100 mL via INTRAVENOUS

## 2016-07-13 MED ORDER — ONDANSETRON HCL 4 MG PO TABS: 4.0000 mg | ORAL_TABLET | Freq: Every day | ORAL | 0 refills | Status: DC | PRN

## 2016-07-13 NOTE — ED Provider Notes (Signed)
[facility] Emergency Department Provider Note    First MD Initiated Contact with Patient 07/13/16 1126     (approximate)  I have reviewed the triage vital signs and the nursing notes.   HISTORY  Chief Complaint Abdominal Pain (LLQ)    HPI Debra Moody is a 75 y.o. female who is very pleasant with significant past medical history including significant diverticulosis status post hemicolectomy. Also has a history of CAD hypertension and hyperlipidemia presenting with 2 days of worsening left lower quadrant pain that suddenly worsened last night and kept her awake. She denies any nausea or vomiting. No diarrhea. She is still passing gas. Denies any measured fevers but does endorse generalized malaise. States it feels very similar to previous episodes of diverticulitis. She initially went to urgent care but was directed to the ER as her blood pressure was borderline hypotensive and due to concern for complicated diverticulitis. The pain is 9 out of 10 in severity with palpation but 3 out of 10 in severity at rest.   Past Medical History:  Diagnosis Date  . Diverticulitis   . GERD (gastroesophageal reflux disease)   . History of shingles 2015  . Hyperlipidemia   . Hypertension   . MI (myocardial infarction) 11/03/2014   Severe damage to the left ventricle   . Osteoporosis   . Skin cancer, basal cell    Family History  Problem Relation Age of Onset  . Heart disease Mother     stent placement  . Heart attack Mother   . Heart disease Father     CABG   . Heart attack Father 36   Past Surgical History:  Procedure Laterality Date  . ABDOMINAL HYSTERECTOMY    . CARDIAC CATHETERIZATION  11/03/2014  . GUM SURGERY     Patient Active Problem List   Diagnosis Date Noted  . Orthostatic hypotension 03/07/2015  . 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 Anxiety 12/01/2014  . 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 Hyperlipidemia 12/01/2014      Prior  to Admission medications   Medication Sig Start Date End Date Taking? Authorizing Provider  ALPRAZolam (XANAX) 0.25 MG tablet Take 0.125 mg by mouth at bedtime as needed.    Yes Historical Provider, MD  aspirin 81 MG tablet Take 81 mg by mouth daily.   Yes Historical Provider, MD  Calcium-Magnesium-Vitamin D (CALCIUM 500 PO) Take 1,000 mg by mouth daily.   Yes Historical Provider, MD  carvedilol (COREG) 3.125 MG tablet Take 1 tablet (3.125 mg total) by mouth 2 (two) times daily with a meal. Patient taking differently: Take 3.125 mg by mouth daily.  09/05/15  Yes Minna Merritts, MD  cetirizine (ZYRTEC) 10 MG tablet Take 10 mg by mouth daily as needed.    Yes Historical Provider, MD  Coenzyme Q10 (COQ10) 100 MG CAPS Take 100 mg by mouth daily.   Yes Historical Provider, MD  Denosumab (PROLIA [place]) Inject 1 Units into the skin every 6 (six) months.   Yes Historical Provider, MD  diphenhydrAMINE (BENADRYL) 25 mg capsule Take 25 mg by mouth every 6 (six) hours as needed.    Yes Historical Provider, MD  docusate sodium (COLACE) 100 MG capsule Take 100 mg by mouth 2 (two) times daily.   Yes Historical Provider, MD  ezetimibe (ZETIA) 10 MG tablet Take 10 mg by mouth daily.   Yes Historical Provider, MD  fexofenadine (ALLEGRA) 180 MG tablet Take by mouth.   Yes Historical Provider, MD  lisinopril (PRINIVIL,ZESTRIL) 2.5 MG tablet Take 1 tablet (2.5 mg total) by mouth daily. 09/05/15  Yes Minna Merritts, MD  magnesium oxide (MAG-OX) 400 MG tablet Take 400 mg by mouth daily.   Yes Historical Provider, MD  Melatonin 5 MG TBDP Take 5 mg by mouth as needed.   Yes Historical Provider, MD  Omega-3 Fatty Acids (FISH OIL) 1000 MG CAPS Take 1,200 mg by mouth daily.    Yes Historical Provider, MD  Phenylephrine-Acetaminophen 5-325 MG TABS Take by mouth as needed.    Yes Historical Provider, MD  Polyethylene Glycol 3350 (MIRALAX PO) Take by mouth every evening.   Yes Historical Provider, MD  Probiotic Product  (PROBIOTIC DAILY PO) Take by mouth daily.   Yes Historical Provider, MD  Simethicone 125 MG TABS Take by mouth as needed.    Yes Historical Provider, MD  Vitamin D, Cholecalciferol, 400 UNITS CAPS Take by mouth daily.   Yes Historical Provider, MD  ciprofloxacin (CIPRO) 500 MG tablet Take 1 tablet (500 mg total) by mouth 2 (two) times daily. 07/13/16 07/27/16  Merlyn Lot, MD  HYDROcodone-acetaminophen (NORCO) 5-325 MG tablet Take 1 tablet by mouth every 4 (four) hours as needed for severe pain. 07/13/16   Merlyn Lot, MD  metroNIDAZOLE (FLAGYL) 500 MG tablet Take 1 tablet (500 mg total) by mouth 3 (three) times daily. 07/13/16 07/23/16  Merlyn Lot, MD  ondansetron (ZOFRAN) 4 MG tablet Take 1 tablet (4 mg total) by mouth daily as needed for nausea or vomiting. 07/13/16 07/13/17  Merlyn Lot, MD    Allergies Alendronate sodium; Ibandronic acid; Simvastatin; and Amoxicillin-pot clavulanate    Social History Social History  Substance Use Topics  . Smoking status: Former Smoker    Years: 18.00    Types: Cigarettes    Quit date: 12/01/1999  . Smokeless tobacco: Not on file  . Alcohol use Yes    Review of Systems Patient denies headaches, rhinorrhea, blurry vision, numbness, shortness of breath, chest pain, edema, cough, abdominal pain, nausea, vomiting, diarrhea, dysuria, fevers, rashes or hallucinations unless otherwise stated above in HPI. ____________________________________________   PHYSICAL EXAM:  VITAL SIGNS: Vitals:   07/13/16 1300 07/13/16 1330  BP: (!) 106/47 (!) 107/53  Pulse: 71 63  Resp:    Temp:      Constitutional: Alert and oriented. Well appearing and in no acute distress. Eyes: Conjunctivae are normal. PERRL. EOMI. Head: Atraumatic. Nose: No congestion/rhinnorhea. Mouth/Throat: Mucous membranes are moist.  Oropharynx non-erythematous. Neck: No stridor. Painless ROM. No cervical spine tenderness to  palpation Hematological/Lymphatic/Immunilogical: No cervical lymphadenopathy. Cardiovascular: Normal rate, regular rhythm. Grossly normal heart sounds.  Good peripheral circulation. Respiratory: Normal respiratory effort.  No retractions. Lungs CTAB. Gastrointestinal: Soft but with guarding ttp of LLQ. No distention. No abdominal bruits. No CVA tenderness.  Musculoskeletal: No lower extremity tenderness nor edema.  No joint effusions. Neurologic:  Normal speech and language. No gross focal neurologic deficits are appreciated. No gait instability. Skin:  Skin is warm, dry and intact. No rash noted. Psychiatric: Mood and affect are normal. Speech and behavior are normal.  ____________________________________________   LABS (all labs ordered are listed, but only abnormal results are displayed)  Results for orders placed or performed during the hospital encounter of 07/13/16 (from the past 24 hour(s))  Lipase, blood     Status: None   Collection Time: 07/13/16 10:30 AM  Result Value Ref Range   Lipase 28 11 - 51 U/L  Comprehensive metabolic panel  Status: Abnormal   Collection Time: 07/13/16 10:30 AM  Result Value Ref Range   Sodium 131 (L) 135 - 145 mmol/L   Potassium 4.4 3.5 - 5.1 mmol/L   Chloride 100 (L) 101 - 111 mmol/L   CO2 25 22 - 32 mmol/L   Glucose, Bld 103 (H) 65 - 99 mg/dL   BUN 15 6 - 20 mg/dL   Creatinine, Ser 0.72 0.44 - 1.00 mg/dL   Calcium 8.6 (L) 8.9 - 10.3 mg/dL   Total Protein 6.8 6.5 - 8.1 g/dL   Albumin 3.9 3.5 - 5.0 g/dL   AST 18 15 - 41 U/L   ALT 14 14 - 54 U/L   Alkaline Phosphatase 46 38 - 126 U/L   Total Bilirubin 1.0 0.3 - 1.2 mg/dL   GFR calc non Af Amer >60 >60 mL/min   GFR calc Af Amer >60 >60 mL/min   Anion gap 6 5 - 15  CBC     Status: None   Collection Time: 07/13/16 10:30 AM  Result Value Ref Range   WBC 8.4 3.6 - 11.0 K/uL   RBC 4.85 3.80 - 5.20 MIL/uL   Hemoglobin 13.9 12.0 - 16.0 g/dL   HCT 41.5 35.0 - 47.0 %   MCV 85.5 80.0 - 100.0  fL   MCH 28.7 26.0 - 34.0 pg   MCHC 33.6 32.0 - 36.0 g/dL   RDW 13.7 11.5 - 14.5 %   Platelets 213 150 - 440 K/uL  Urinalysis complete, with microscopic     Status: Abnormal   Collection Time: 07/13/16 10:30 AM  Result Value Ref Range   Color, Urine YELLOW (A) YELLOW   APPearance CLEAR (A) CLEAR   Glucose, UA NEGATIVE NEGATIVE mg/dL   Bilirubin Urine NEGATIVE NEGATIVE   Ketones, ur NEGATIVE NEGATIVE mg/dL   Specific Gravity, Urine 1.018 1.005 - 1.030   Hgb urine dipstick 1+ (A) NEGATIVE   pH 5.0 5.0 - 8.0   Protein, ur NEGATIVE NEGATIVE mg/dL   Nitrite NEGATIVE NEGATIVE   Leukocytes, UA TRACE (A) NEGATIVE   RBC / HPF 0-5 0 - 5 RBC/hpf   WBC, UA 0-5 0 - 5 WBC/hpf   Bacteria, UA NONE SEEN NONE SEEN   Squamous Epithelial / LPF 0-5 (A) NONE SEEN   Mucous PRESENT    Hyaline Casts, UA PRESENT    ____________________________________________   ____________________________________________  RADIOLOGY  I personally reviewed all radiographic images ordered to evaluate for the above acute complaints and reviewed radiology reports and findings.  These findings were personally discussed with the patient.  Please see medical record for radiology report.  ____________________________________________   PROCEDURES  Procedure(s) performed: none Procedures    Critical Care performed: no ____________________________________________   INITIAL IMPRESSION / ASSESSMENT AND PLAN / ED COURSE  Pertinent labs & imaging results that were available during my care of the patient were reviewed by me and considered in my medical decision making (see chart for details).  DDX: diverticulitis, abscess, perforation, stone, pyelo, msk strain  MAHINA PETRUSKA is a 75 y.o. who presents to the ED with complaint of acute left lower quadrant pain.  Patient is AFVSS in ED. Exam as above. Given current presentation have considered the above differential.  Presentation primary concerning for worsening  diverticulitis but given her extensive history and concern for complicated diverticulitis with abscess versus perforation based on the tenderness on exam. We'll order CT imaging to further evaluate. We'll provide IV fluids as she does appear mildly dehydrated  as well as IV pain medications.  The patient will be placed on continuous pulse oximetry and telemetry for monitoring.  Laboratory evaluation will be sent to evaluate for the above complaints.     Clinical Course as of Jul 13 1408  Fri Jul 13, 2016  1341 Repeat abdominal exam is reassuring. Discussed results of CT imaging with patient that shows uncomplicated diverticulitis. Patient feels much better after IV fluids. We will trial oral antibiotics. Patient requesting discharge home. Has agreed to stay until per that she can tolerate oral hydration as well as antibiotics.  [PR]    Clinical Course User Index [PR] Merlyn Lot, MD    ----------------------------------------- 2:09 PM on 07/13/2016 ----------------------------------------- Patient was able to tolerate PO and was able to ambulate with a steady gait.  Have discussed with the patient and available family all diagnostics and treatments performed thus far and all questions were answered to the best of my ability. The patient demonstrates understanding and agreement with plan.   ____________________________________________   FINAL CLINICAL IMPRESSION(S) / ED DIAGNOSES  Final diagnoses:  Diverticulitis of large intestine without perforation or abscess without bleeding  LLQ abdominal pain      NEW MEDICATIONS STARTED DURING THIS VISIT:  New Prescriptions   CIPROFLOXACIN (CIPRO) 500 MG TABLET    Take 1 tablet (500 mg total) by mouth 2 (two) times daily.   HYDROCODONE-ACETAMINOPHEN (NORCO) 5-325 MG TABLET    Take 1 tablet by mouth every 4 (four) hours as needed for severe pain.   METRONIDAZOLE (FLAGYL) 500 MG TABLET    Take 1 tablet (500 mg total) by mouth 3 (three) times  daily.   ONDANSETRON (ZOFRAN) 4 MG TABLET    Take 1 tablet (4 mg total) by mouth daily as needed for nausea or vomiting.     Note:  This document was prepared using Dragon voice recognition software and may include unintentional dictation errors.    Merlyn Lot, MD 07/13/16 1409

## 2016-07-13 NOTE — ED Triage Notes (Signed)
Pt brought over from [facility] with c/o LLQ pain started last night.

## 2016-09-05 ENCOUNTER — Ambulatory Visit (INDEPENDENT_AMBULATORY_CARE_PROVIDER_SITE_OTHER): Payer: Medicare Other | Admitting: Cardiovascular Disease

## 2016-09-05 ENCOUNTER — Encounter: Payer: Self-pay | Admitting: Cardiovascular Disease

## 2016-09-05 VITALS — BP 110/60 | HR 66 | Ht 63.0 in | Wt 109.5 lb

## 2016-09-05 DIAGNOSIS — I5181 Takotsubo syndrome: Secondary | ICD-10-CM | POA: Diagnosis not present

## 2016-09-05 DIAGNOSIS — I420 Dilated cardiomyopathy: Secondary | ICD-10-CM | POA: Diagnosis not present

## 2016-09-05 DIAGNOSIS — E782 Mixed hyperlipidemia: Secondary | ICD-10-CM

## 2016-09-05 DIAGNOSIS — I951 Orthostatic hypotension: Secondary | ICD-10-CM

## 2016-09-05 DIAGNOSIS — I2510 Atherosclerotic heart disease of native coronary artery without angina pectoris: Secondary | ICD-10-CM

## 2016-09-05 MED ORDER — ROSUVASTATIN CALCIUM 5 MG PO TABS: 5.0000 mg | ORAL_TABLET | Freq: Every day | ORAL | 6 refills | Status: DC

## 2016-09-05 NOTE — Progress Notes (Signed)
Cardiology Office Note  Date:  09/05/2016   ID:  Debra Moody, DOB [birth year], MRN [id number]  PCP:  Debra Aus, MD   Chief Complaint  Patient presents with  . other    1 yr f/u no complaints today. Meds reviewed verbally with pt.    HPI:  Debra Moody is a pleasant 76 year old woman with previous history of GERD, diverticulitis, coronary artery disease, hysterectomy in the 80s, prior bladder surgery, elbow fracture October 2014, shingles September 2015, previously seen for chest pain, elevated cardiac enzymes, stress related cardiomyopathy. She presents today for follow-up of her nonischemic cardiomyopathy  In general she reports that she feels well, denies any cardiac issues, no shortness of breath on exertion, no leg swelling, no abdominal fullness, no PND or orthopnea Was having falls on coreg. She stopped the medication, reports that falls have been better Previously was only taking carvedilol 3.125 mg daily  Reports that she had a Spell with diverticuliitis Hx of osteoarthritis, trying to avoid falls No regular exercise program  Long discussion concerning her coronary disease, cholesterol could not tolerate statins, "tried the all" Had myalgias Currently takes fish oil, Zetia No episodes of near syncope or syncope  Lab work reviewed with her Total cholesterol 200, LDL 120s Denies any recent stress  Previous echocardiogram showing normal ejection fraction  EKG on today's visit shows normal sinus rhythm with rate 66 bpm, no significant ST or T-wave changes, PVCs noted  Other past medical history  she was traveling out of state, was in [place] when she developed amnesia type symptoms. Her husband took her to the hospital. She had MRI and CT of the head that showed no stroke. Further workup showed abnormal EKG, abnormal echocardiogram, elevated troponin up to more than 3. Ischemia could not be excluded and she was taken to the cardiac catheterization lab 11/03/2014. The  report details baseline normal coronary arteries, probable takotsubo syndrome as there was LV dysfunction, ejection fraction estimated at 25% with anteroapical, apical and apical inferior wall akinesis She was noted to have coronary spasm with a reversible 90% proximal RCA lesion, reversible 50% proximal left circumflex lesion She was discharged home on low-dose carvedilol and lisinopril  Lab work done in the hospital March 2016 shows total cholesterol 199, LDL 113, HDL 79   PMH:   has a past medical history of Diverticulitis; GERD (gastroesophageal reflux disease); History of shingles (2015); Hyperlipidemia; Hypertension; MI (myocardial infarction) (11/03/2014); Osteoporosis; and Skin cancer, basal cell.  PSH:    Past Surgical History:  Procedure Laterality Date  . ABDOMINAL HYSTERECTOMY    . CARDIAC CATHETERIZATION  11/03/2014  . GUM SURGERY      Current Outpatient Prescriptions  Medication Sig Dispense Refill  . aspirin 81 MG tablet Take 81 mg by mouth daily.    . Calcium-Magnesium-Vitamin D (CALCIUM 500 PO) Take 2,000 mg by mouth 2 (two) times daily.     . cetirizine (ZYRTEC) 10 MG tablet Take 10 mg by mouth daily as needed.     . Coenzyme Q10 (COQ10) 100 MG CAPS Take 100 mg by mouth daily.    . Denosumab (PROLIA [place]) Inject 1 Units into the skin every 6 (six) months.    . diphenhydrAMINE (BENADRYL) 25 mg capsule Take 25 mg by mouth every 6 (six) hours as needed.     . docusate sodium (COLACE) 100 MG capsule Take 100 mg by mouth 2 (two) times daily.    Marland Kitchen ezetimibe (ZETIA) 10 MG tablet Take 10 mg  by mouth daily.    . fexofenadine (ALLEGRA) 180 MG tablet Take by mouth.    Marland Kitchen HYDROcodone-acetaminophen (NORCO) 5-325 MG tablet Take 1 tablet by mouth every 4 (four) hours as needed for severe pain. 6 tablet 0  . lisinopril (PRINIVIL,ZESTRIL) 2.5 MG tablet Take 1 tablet (2.5 mg total) by mouth daily. 90 tablet 3  . Melatonin 5 MG TBDP Take 5 mg by mouth as needed.    . Omega-3 Fatty Acids  (FISH OIL) 1000 MG CAPS Take 1,200 mg by mouth daily.     Marland Kitchen Phenylephrine-Acetaminophen 5-325 MG TABS Take by mouth as needed.     . Polyethylene Glycol 3350 (MIRALAX PO) Take by mouth every evening.    . Probiotic Product (PROBIOTIC DAILY PO) Take by mouth daily.    . Simethicone 125 MG TABS Take by mouth as needed.     . rosuvastatin (CRESTOR) 5 MG tablet Take 1 tablet (5 mg total) by mouth daily. 30 tablet 6   No current facility-administered medications for this visit.      Allergies:   Alendronate sodium; Ibandronic acid; Simvastatin; and Amoxicillin-pot clavulanate   Social History:  The patient  reports that she quit smoking about 16 years ago. Her smoking use included Cigarettes. She quit after 18.00 years of use. She has never used smokeless tobacco. She reports that she drinks alcohol. She reports that she does not use drugs.   Family History:   family history includes Heart attack in her mother; Heart attack (age of onset: 24) in her father; Heart disease in her father and mother.    Review of Systems: Review of Systems  Constitutional: Positive for malaise/fatigue.  Respiratory: Negative.   Cardiovascular: Negative.   Gastrointestinal: Negative.   Musculoskeletal: Positive for falls.  Neurological: Negative.   Psychiatric/Behavioral: Negative.   All other systems reviewed and are negative.    PHYSICAL EXAM: VS:  BP 110/60 (BP Location: Left Arm, Patient Position: Sitting, Cuff Size: Normal)   Pulse 66   Ht 5\' 3"  (1.6 m)   Wt 109 lb 8 oz (49.7 kg)   BMI 19.40 kg/m  , BMI Body mass index is 19.4 kg/m. GEN: Well nourished, well developed, in no acute distress  HEENT: normal  Neck: no JVD, carotid bruits, or masses Cardiac: RRR; no murmurs, rubs, or gallops,no edema  Respiratory:  clear to auscultation bilaterally, normal work of breathing GI: soft, nontender, nondistended, + BS MS: no deformity or atrophy  Skin: warm and dry, no rash Neuro:  Strength and  sensation are intact Psych: euthymic mood, full affect    Recent Labs: 07/13/2016: ALT 14; BUN 15; Creatinine, Ser 0.72; Hemoglobin 13.9; Platelets 213; Potassium 4.4; Sodium 131    Lipid Panel No results found for: CHOL, HDL, LDLCALC, TRIG    Wt Readings from Last 3 Encounters:  09/05/16 109 lb 8 oz (49.7 kg)  07/13/16 110 lb (49.9 kg)  09/05/15 110 lb 8 oz (50.1 kg)       ASSESSMENT AND PLAN:  Congestive dilated cardiomyopathy ([place]) - Plan: EKG 12-Lead Currently taking lisinopril, not on carvedilol secondary to self perceived falling on the beta blocker. CHF symptoms to watch for discussed with her in detail  Takotsubo syndrome Denies any recurrent symptoms, she is doing stress reduction techniques Various types of techniques discussed with her in detail including starting exercise program  Mixed hyperlipidemia Long discussion with her, She takes Zetia daily She would like to lower cholesterol of possible. Recommended she try Crestor  5 mg 2 days per week. Fish oil, Krill oil, flaxseed oil likely of little benefit as triglycerides low  Coronary artery disease involving native coronary artery of native heart without angina pectoris No significant coronary disease noted on prior catheterization, reversible spasm  Orthostatic hypotension Coreg for falls Tolerating low-dose lisinopril but does have a cough We did discuss possibly changing her to losartan if cough is bothersome   Total encounter time more than 25 minutes  Greater than 50% was spent in counseling and coordination of care with the patient   Disposition:   F/U  12 months   Orders Placed This Encounter  Procedures  . EKG 12-Lead     Signed, Esmond Plants, M.D., Ph.D. 09/05/2016  [place], [place]

## 2016-09-05 NOTE — Patient Instructions (Addendum)
Medication Instructions:   Please take crestor 5 mg twice a week  Labwork:  No new labs needed  Testing/Procedures:  No further testing at this time   I recommend watching educational videos on topics of interest to you at:       [email or web address]  Enter code: [email or web address]    Follow-Up: It was a pleasure seeing you in the office today. Please call us if you have new issues that need to be addressed before your next appt.  [phone]  Your physician wants you to follow-up in: 12 months.  You will receive a reminder letter in the mail two months in advance. If you don't receive a letter, please call our office to schedule the follow-up appointment.  If you need a refill on your cardiac medications before your next appointment, please call your pharmacy.

## 2016-11-08 IMAGING — MR MRI HEAD WITHOUT AND WITH CONTRAST
11 of 12 series · 40 of 48 positions shown · IV contrast (9ML MULTIHANCE)
Comparison: Report of CT head without contrast 12/08/2007

CLINICAL DATA: Memory loss and confusion. Dizziness. Recent
myocardial infarction with onset of neurologic symptoms at that
time.

EXAM:
MRI HEAD WITHOUT AND WITH CONTRAST
TECHNIQUE: Multiplanar, multiecho pulse sequences of the brain and surrounding
structures were obtained without and with intravenous contrast.
CONTRAST:  9 mL MultiHance

[Series 2: T1 · sagittal · 5.0mm · 0.45mm/px · 2 of 27 slices shown]
[im 1/27]
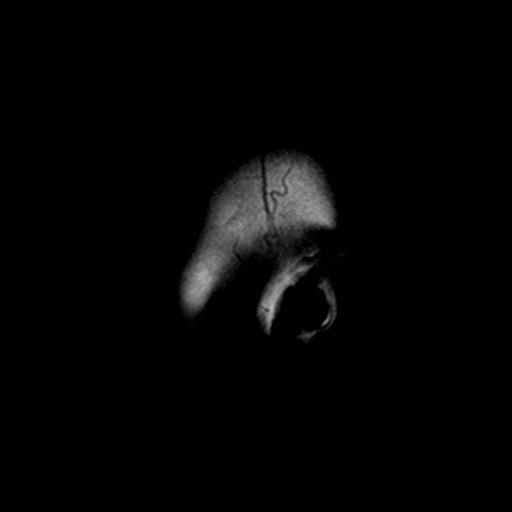
[im 9/27]
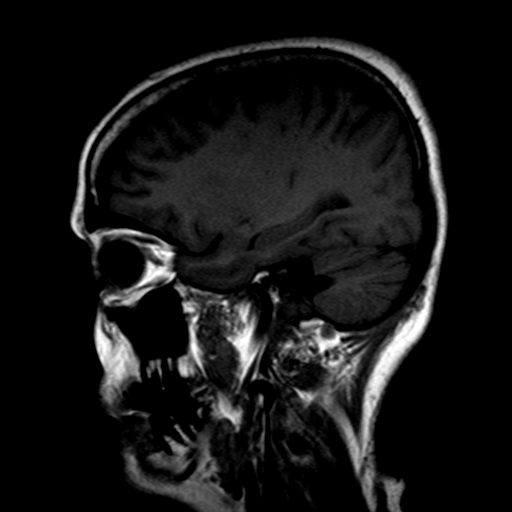

[Series 4: DWI · axial · 4.0mm · 1.80mm/px · z∈[-82,+92]mm · 5 of 45 slices shown (1 of 4)]
[im 1/45]
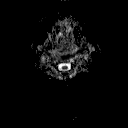
[im 12/45]
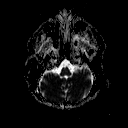
[im 23/45]
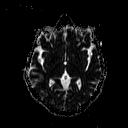
[im 34/45]
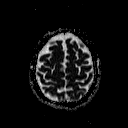
[im 45/45]
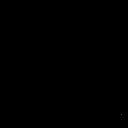

[Series 5: DWI · axial · 4.0mm · 1.80mm/px · z∈[-74,+80]mm · 4 of 40 slices shown (2 of 4)]
[im 1/40]
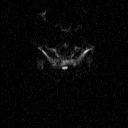
[im 14/40]
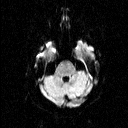
[im 27/40]
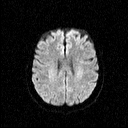
[im 40/40]
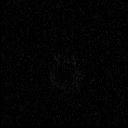

[Series 7: DWI · coronal · 5.0mm · 1.80mm/px · 4 of 37 slices shown (3 of 4)]
[im 1/37]
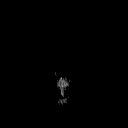
[im 13/37]
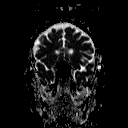
[im 25/37]
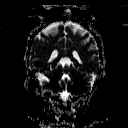
[im 37/37]
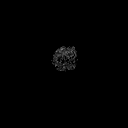

[Series 8: DWI · coronal · 5.0mm · 1.80mm/px · 4 of 37 slices shown (4 of 4)]
[im 1/37]
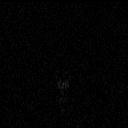
[im 13/37]
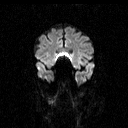
[im 25/37]
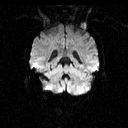
[im 37/37]
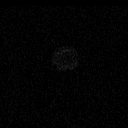

[Series 9: T2 · axial · 5.0mm · 0.45mm/px · z∈[-67,+88]mm · 3 of 25 slices shown (1 of 2)]
[im 1/25]
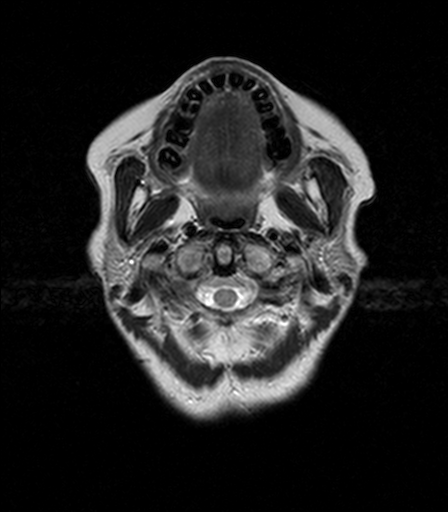
[im 13/25]
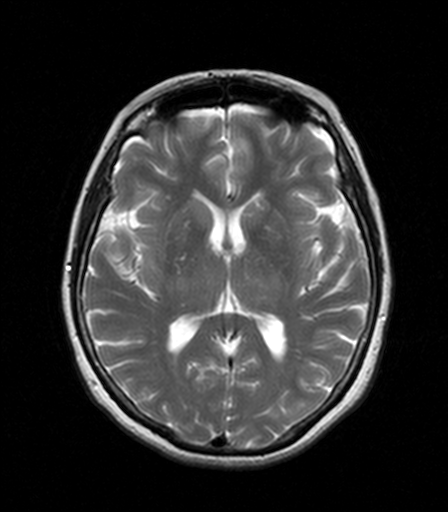
[im 25/25]
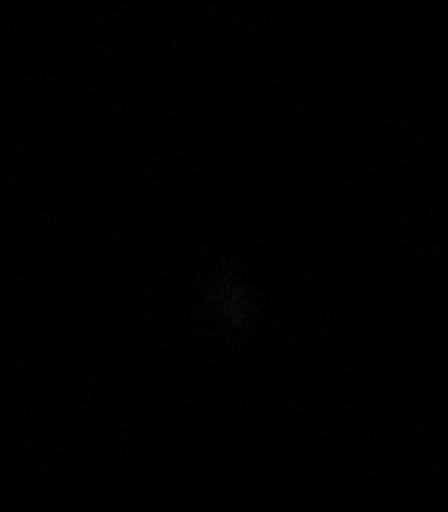

[Series 10: FLAIR · axial · 5.0mm · 0.90mm/px · z∈[-67,+87]mm · 3 of 25 slices shown]
[im 1/25]
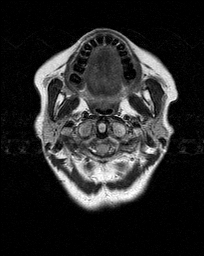
[im 13/25]
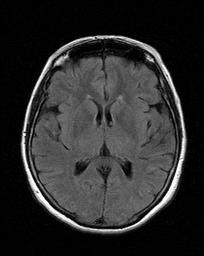
[im 25/25]
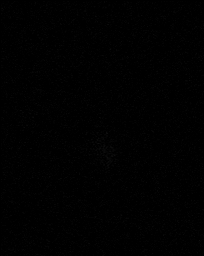

[Series 11: T2 · axial · 5.0mm · 0.45mm/px · z∈[-67,+88]mm · 3 of 25 slices shown (2 of 2)]
[im 1/25]
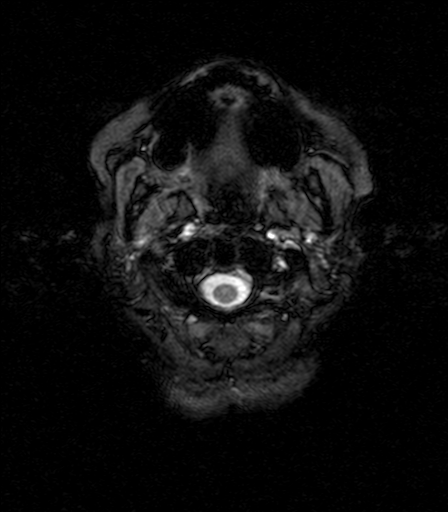
[im 13/25]
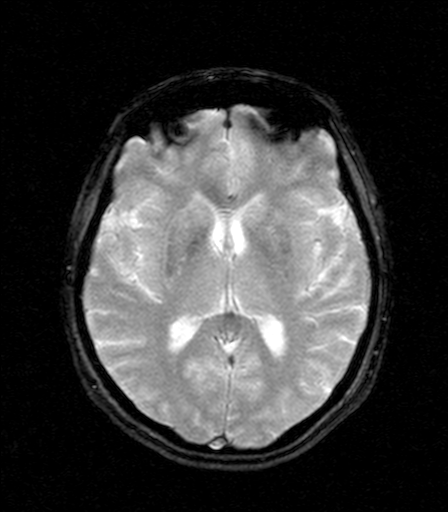
[im 25/25]
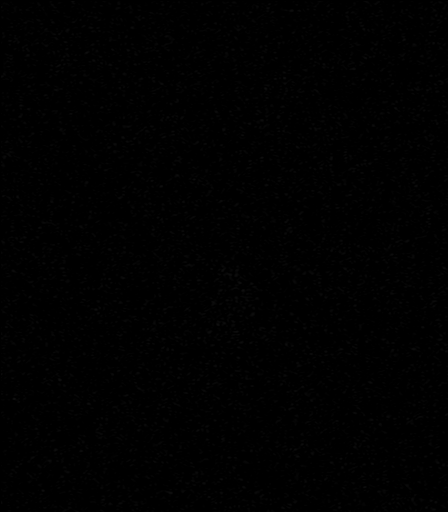

[Series 13: T2 post-contrast · coronal · 5.0mm · 0.45mm/px · 3 of 29 slices shown]
[im 1/29]
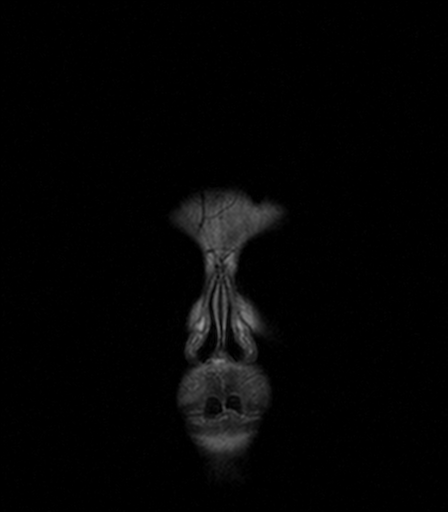
[im 15/29]
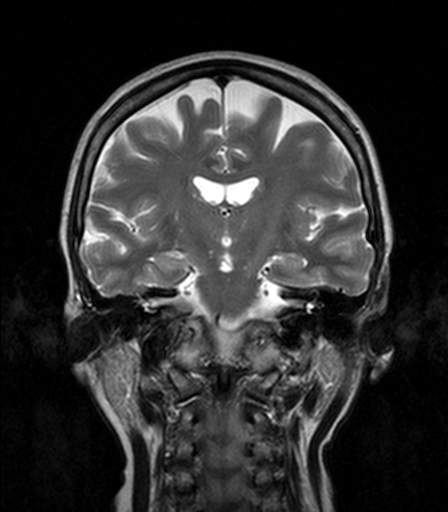
[im 29/29]
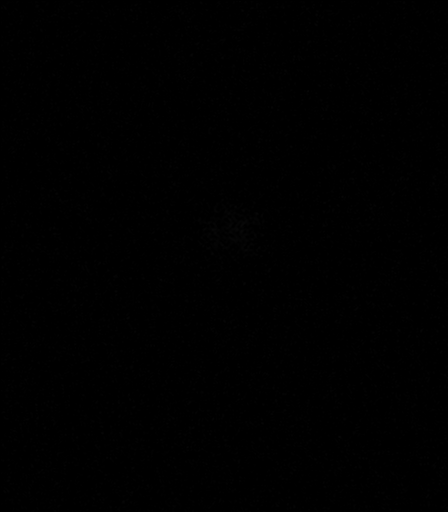

[Series 14: T1 post-contrast · axial · 3.0mm · 0.45mm/px · z∈[-72,+92]mm · 6 of 56 slices shown (1 of 2)]
[im 1/56]
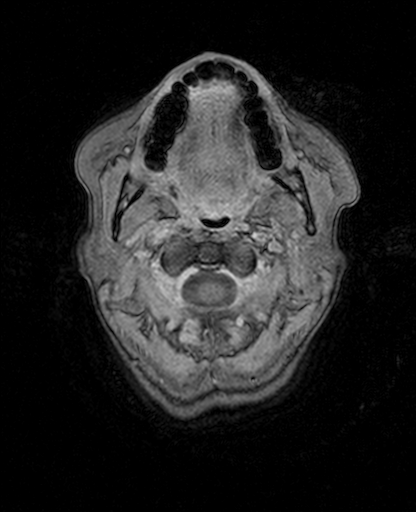
[im 12/56]
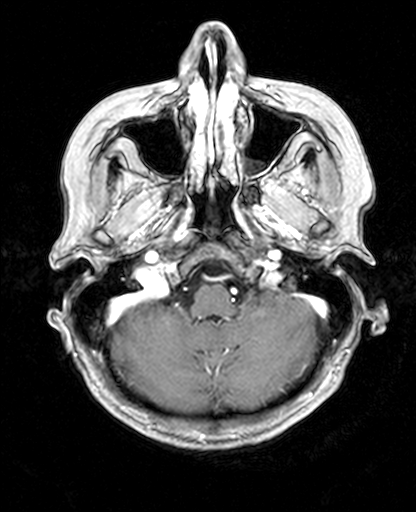
[im 23/56]
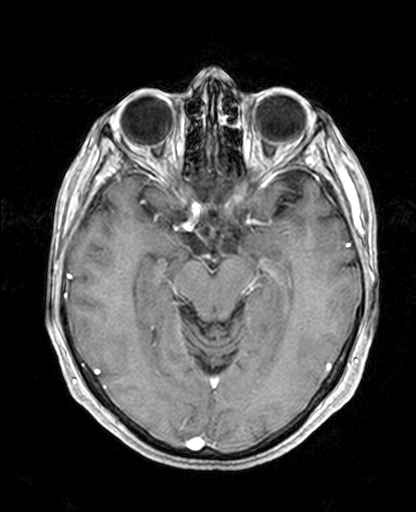
[im 34/56]
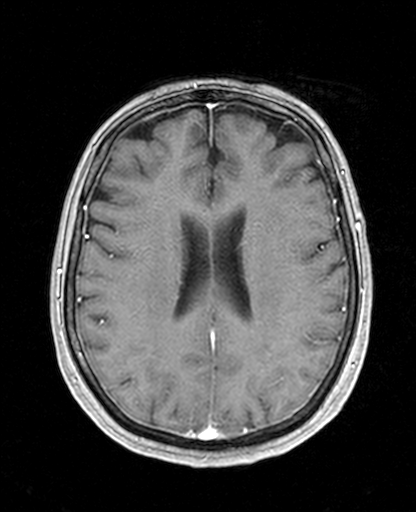
[im 45/56]
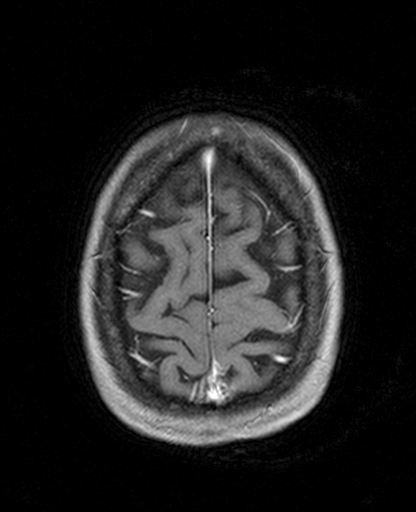
[im 56/56]
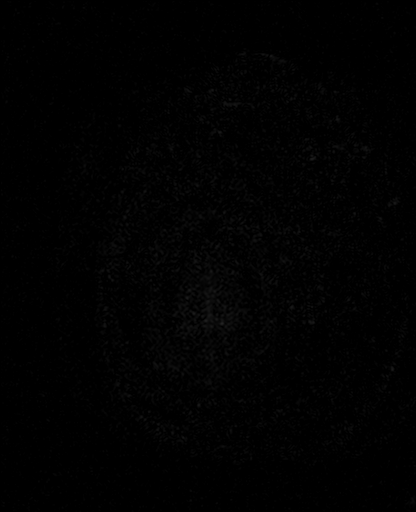

[Series 15: T1 post-contrast · coronal · 5.0mm · 0.45mm/px · 3 of 29 slices shown (2 of 2)]
[im 1/29]
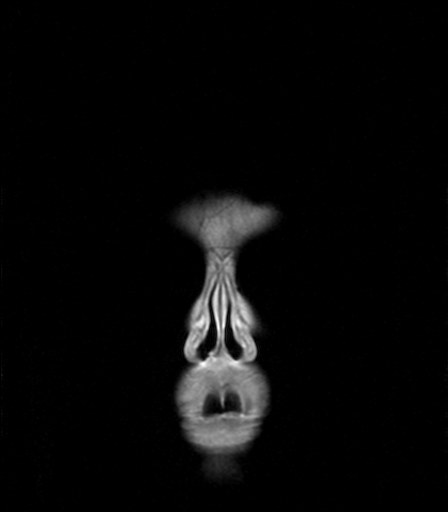
[im 15/29]
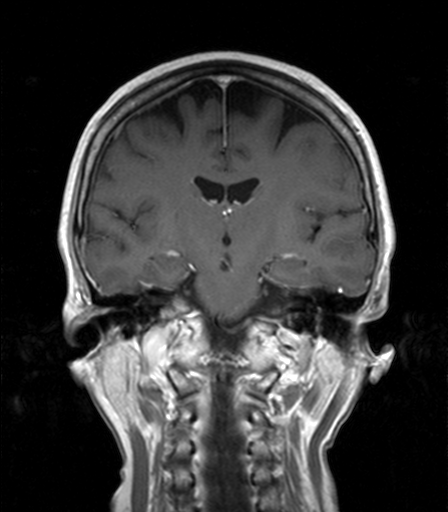
[im 29/29]
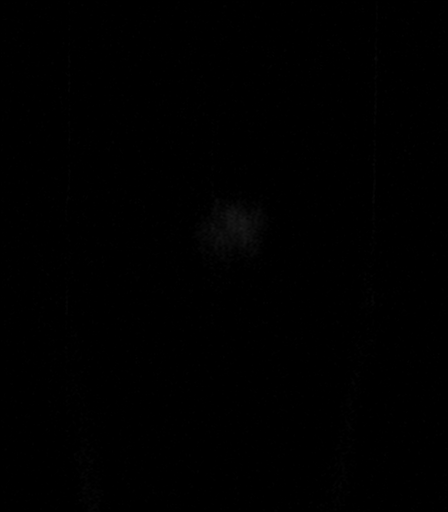

[40 of 48 positions shown; findings below may reference images not displayed]

FINDINGS: The diffusion-weighted images demonstrate no evidence for acute or
subacute infarct. Minimal atrophy and white matter changes within
normal abuts for age. No acute hemorrhage or mass lesion is present.

The ventricles are of normal size. No significant extraaxial fluid
collection is present.

The globes and orbits are intact. Mild mucosal thickening or a polyp
is present posteriorly in the left maxillary sinus. The remaining
paranasal sinuses and the mastoid air cells are clear. The skullbase
is within normal limits. Midline structures are normal.

The postcontrast images demonstrate no pathologic enhancement.
IMPRESSION: 1. Normal MRI appearance the brain.
2. Mild mucosal thickening or polyp in the posterior left maxillary
sinus.

## 2017-01-17 ENCOUNTER — Other Ambulatory Visit: Payer: Self-pay | Admitting: Internal Medicine

## 2017-01-17 DIAGNOSIS — Z1231 Encounter for screening mammogram for malignant neoplasm of breast: Secondary | ICD-10-CM

## 2017-03-04 ENCOUNTER — Ambulatory Visit
Admission: RE | Admit: 2017-03-04 | Discharge: 2017-03-04 | Disposition: A | Discharge status: Home / Self Care | Payer: Medicare Other | Source: Ambulatory Visit | Attending: Internal Medicine | Admitting: Internal Medicine

## 2017-03-04 DIAGNOSIS — Z1231 Encounter for screening mammogram for malignant neoplasm of breast: Secondary | ICD-10-CM | POA: Diagnosis not present

## 2017-06-21 ENCOUNTER — Other Ambulatory Visit: Payer: Self-pay | Admitting: Cardiovascular Disease

## 2017-07-01 ENCOUNTER — Encounter
Admission: RE | Disposition: A | Discharge status: Home / Self Care | Payer: Self-pay | Source: Ambulatory Visit | Attending: Unknown Physician Specialty

## 2017-07-01 ENCOUNTER — Ambulatory Visit
Admission: RE | Admit: 2017-07-01 | Discharge: 2017-07-01 | Disposition: A | Discharge status: Home / Self Care | Payer: Medicare Other | Source: Ambulatory Visit | Attending: Unknown Physician Specialty | Admitting: Unknown Physician Specialty

## 2017-07-01 ENCOUNTER — Ambulatory Visit: Payer: Medicare Other | Admitting: Certified Registered Nurse Anesthetist

## 2017-07-01 DIAGNOSIS — K640 First degree hemorrhoids: Secondary | ICD-10-CM | POA: Diagnosis not present

## 2017-07-01 DIAGNOSIS — Z7982 Long term (current) use of aspirin: Secondary | ICD-10-CM | POA: Diagnosis not present

## 2017-07-01 DIAGNOSIS — Z8619 Personal history of other infectious and parasitic diseases: Secondary | ICD-10-CM | POA: Diagnosis not present

## 2017-07-01 DIAGNOSIS — Z85828 Personal history of other malignant neoplasm of skin: Secondary | ICD-10-CM | POA: Insufficient documentation

## 2017-07-01 DIAGNOSIS — Z87891 Personal history of nicotine dependence: Secondary | ICD-10-CM | POA: Diagnosis not present

## 2017-07-01 DIAGNOSIS — I10 Essential (primary) hypertension: Secondary | ICD-10-CM | POA: Insufficient documentation

## 2017-07-01 DIAGNOSIS — K5730 Diverticulosis of large intestine without perforation or abscess without bleeding: Secondary | ICD-10-CM | POA: Insufficient documentation

## 2017-07-01 DIAGNOSIS — Z86010 Personal history of colonic polyps: Secondary | ICD-10-CM | POA: Diagnosis not present

## 2017-07-01 DIAGNOSIS — E785 Hyperlipidemia, unspecified: Secondary | ICD-10-CM | POA: Diagnosis not present

## 2017-07-01 DIAGNOSIS — I252 Old myocardial infarction: Secondary | ICD-10-CM | POA: Insufficient documentation

## 2017-07-01 DIAGNOSIS — Z1211 Encounter for screening for malignant neoplasm of colon: Secondary | ICD-10-CM | POA: Diagnosis present

## 2017-07-01 DIAGNOSIS — Z79899 Other long term (current) drug therapy: Secondary | ICD-10-CM | POA: Insufficient documentation

## 2017-07-01 DIAGNOSIS — K219 Gastro-esophageal reflux disease without esophagitis: Secondary | ICD-10-CM | POA: Diagnosis not present

## 2017-07-01 HISTORY — PX: COLONOSCOPY WITH PROPOFOL: SHX5780

## 2017-07-01 SURGERY — COLONOSCOPY WITH PROPOFOL
Anesthesia: General

## 2017-07-01 MED ORDER — PROPOFOL 500 MG/50ML IV EMUL
INTRAVENOUS | Status: DC | PRN
  Administered 2017-07-01: 140 ug/kg/min via INTRAVENOUS

## 2017-07-01 MED ORDER — PROPOFOL 10 MG/ML IV BOLUS
INTRAVENOUS | Status: DC | PRN
  Administered 2017-07-01: 40 mg via INTRAVENOUS
  Administered 2017-07-01 (×2): 10 mg via INTRAVENOUS

## 2017-07-01 MED ORDER — PROPOFOL 500 MG/50ML IV EMUL
INTRAVENOUS | Status: AC
  Filled 2017-07-01: qty 50

## 2017-07-01 MED ORDER — SODIUM CHLORIDE 0.9 % IV SOLN
INTRAVENOUS | Status: DC
  Administered 2017-07-01: 1000 mL via INTRAVENOUS

## 2017-07-01 MED ORDER — GLYCOPYRROLATE 0.2 MG/ML IJ SOLN
INTRAMUSCULAR | Status: AC
  Filled 2017-07-01: qty 1

## 2017-07-01 MED ORDER — LIDOCAINE HCL (PF) 2 % IJ SOLN
INTRAMUSCULAR | Status: AC
  Filled 2017-07-01: qty 10

## 2017-07-01 MED ORDER — LIDOCAINE HCL (CARDIAC) 20 MG/ML IV SOLN
INTRAVENOUS | Status: DC | PRN
  Administered 2017-07-01: 50 mg via INTRAVENOUS

## 2017-07-01 MED ORDER — SODIUM CHLORIDE 0.9 % IV SOLN: INTRAVENOUS | Status: DC

## 2017-07-01 MED ORDER — LIDOCAINE HCL (PF) 1 % IJ SOLN
2.0000 mL | Freq: Once | INTRAMUSCULAR | Status: AC
  Administered 2017-07-01: 0.3 mL via INTRADERMAL

## 2017-07-01 MED ORDER — LIDOCAINE HCL (PF) 1 % IJ SOLN
INTRAMUSCULAR | Status: AC
  Administered 2017-07-01: 0.3 mL via INTRADERMAL
  Filled 2017-07-01: qty 2

## 2017-07-01 NOTE — Transfer of Care (Signed)
Immediate Anesthesia Transfer of Care Note  Patient: Debra Moody  Procedure(s) Performed: COLONOSCOPY WITH PROPOFOL (N/A )  Patient Location: PACU  Anesthesia Type:General  Level of Consciousness: awake, alert  and oriented  Airway & Oxygen Therapy: Patient Spontanous Breathing and Patient connected to nasal cannula oxygen  Post-op Assessment: Report given to RN and Post -op Vital signs reviewed and stable  Post vital signs: Reviewed and stable  Last Vitals:  Vitals:   07/01/17 1203 07/01/17 1256  BP: (!) 117/48   Pulse: 76 (P) 84  Resp: 17 (P) 19  Temp: (!) 35.9 C (!) (P) 35.8 C  SpO2: 100% (P) 100%    Last Pain:  Vitals:   07/01/17 1256  TempSrc: (P) Tympanic         Complications: No apparent anesthesia complications

## 2017-07-01 NOTE — Anesthesia Procedure Notes (Signed)
Performed by: Nagee Goates, CRNA Pre-anesthesia Checklist: Emergency Drugs available, Patient identified, Suction available, Patient being monitored and Timeout performed Patient Re-evaluated:Patient Re-evaluated prior to induction Oxygen Delivery Method: Nasal cannula Induction Type: IV induction       

## 2017-07-01 NOTE — Anesthesia Preprocedure Evaluation (Signed)
Anesthesia Evaluation  Patient identified by MRN, date of birth, ID band Patient awake    Reviewed: Allergy & Precautions  Airway Mallampati: III       Dental  (+) Teeth Intact   Pulmonary former smoker,    breath sounds clear to auscultation       Cardiovascular hypertension, Pt. on medications + CAD and + Past MI   Rhythm:Regular Rate:Normal     Neuro/Psych Anxiety    GI/Hepatic Neg liver ROS, GERD  ,  Endo/Other  negative endocrine ROS  Renal/GU negative Renal ROS     Musculoskeletal negative musculoskeletal ROS (+)   Abdominal Normal abdominal exam  (+)   Peds  Hematology negative hematology ROS (+)   Anesthesia Other Findings   Reproductive/Obstetrics                             Anesthesia Physical Anesthesia Plan  ASA: III  Anesthesia Plan: General   Post-op Pain Management:    Induction: Intravenous  PONV Risk Score and Plan: 0  Airway Management Planned: Natural Airway and Nasal Cannula  Additional Equipment:   Intra-op Plan:   Post-operative Plan:   Informed Consent: I have reviewed the patients History and Physical, chart, labs and discussed the procedure including the risks, benefits and alternatives for the proposed anesthesia with the patient or authorized representative who has indicated his/her understanding and acceptance.     Plan Discussed with: CRNA  Anesthesia Plan Comments:         Anesthesia Quick Evaluation

## 2017-07-01 NOTE — Op Note (Signed)
[facility] Gastroenterology Patient Name: Dim Meisinger Procedure Date: 07/01/2017 12:32 PM MRN: [id number] Account #: [id number] Date of Birth: 1941/05/21 Admit Type: Outpatient Age: 76 Room: [facility] ENDO ROOM 3 Gender: Female Note Status: Finalized Procedure:            Colonoscopy Indications:          High risk colon cancer surveillance: Personal history                        of colonic polyps Providers:            Manya Silvas, MD Referring MD:         Rusty Aus, MD (Referring MD) Medicines:            Propofol per Anesthesia Complications:        No immediate complications. Procedure:            Pre-Anesthesia Assessment:                       - After reviewing the risks and benefits, the patient                        was deemed in satisfactory condition to undergo the                        procedure.                       After obtaining informed consent, the colonoscope was                        passed under direct vision. Throughout the procedure,                        the patient's blood pressure, pulse, and oxygen                        saturations were monitored continuously. The                        Colonoscope was introduced through the anus and                        advanced to the the cecum, identified by appendiceal                        orifice and ileocecal valve. The colonoscopy was                        somewhat difficult due to significant looping and a                        tortuous colon. Successful completion of the procedure                        was aided by using manual pressure. The patient                        tolerated the procedure well. The quality of the bowel  preparation was excellent. Findings:      Multiple small- medium-mouthed diverticula were found in the sigmoid       colon, descending colon, transverse colon and ascending colon.      Internal hemorrhoids were found during  endoscopy. The hemorrhoids were       small and Grade I (internal hemorrhoids that do not prolapse).      The exam was otherwise without abnormality. Impression:           - Diverticulosis in the sigmoid colon, in the                        descending colon, in the transverse colon and in the                        ascending colon.                       - Internal hemorrhoids.                       - The examination was otherwise normal.                       - No specimens collected. Recommendation:       - The findings and recommendations were discussed with                        the patient's family. Manya Silvas, MD 07/01/2017 12:58:14 PM This report has been signed electronically. Number of Addenda: 0 Note Initiated On: 07/01/2017 12:32 PM Scope Withdrawal Time: 0 hours 8 minutes 56 seconds  Total Procedure Duration: 0 hours 17 minutes 11 seconds       [facility]

## 2017-07-01 NOTE — Anesthesia Post-op Follow-up Note (Signed)
Anesthesia QCDR form completed.        

## 2017-07-01 NOTE — H&P (Signed)
Primary Care Physician:  Rusty Aus, MD Primary Gastroenterologist:  Dr. Vira Agar  Pre-Procedure History & Physical: HPI:  Debra Moody is a 76 y.o. female is here for an colonoscopy.   Past Medical History:  Diagnosis Date  . Diverticulitis   . GERD (gastroesophageal reflux disease)   . History of shingles 2015  . Hyperlipidemia   . Hypertension   . MI (myocardial infarction) ([place]) 11/03/2014   Severe damage to the left ventricle   . Osteoporosis   . Skin cancer, basal cell     Past Surgical History:  Procedure Laterality Date  . ABDOMINAL HYSTERECTOMY    . CARDIAC CATHETERIZATION  11/03/2014  . GUM SURGERY      Prior to Admission medications   Medication Sig Start Date End Date Taking? Authorizing Provider  aspirin 81 MG tablet Take 81 mg by mouth daily.    [provider]  Calcium-Magnesium-Vitamin D (CALCIUM 500 PO) Take 2,000 mg by mouth 2 (two) times daily.     [provider]  cetirizine (ZYRTEC) 10 MG tablet Take 10 mg by mouth daily as needed.     [provider]  Coenzyme Q10 (COQ10) 100 MG CAPS Take 100 mg by mouth daily.    [provider]  Denosumab (PROLIA [place]) Inject 1 Units into the skin every 6 (six) months.    [provider]  diphenhydrAMINE (BENADRYL) 25 mg capsule Take 25 mg by mouth every 6 (six) hours as needed.     [provider]  docusate sodium (COLACE) 100 MG capsule Take 100 mg by mouth 2 (two) times daily.    [provider]  ezetimibe (ZETIA) 10 MG tablet Take 10 mg by mouth daily.    [provider]  fexofenadine (ALLEGRA) 180 MG tablet Take by mouth.    [provider]  HYDROcodone-acetaminophen (NORCO) 5-325 MG tablet Take 1 tablet by mouth every 4 (four) hours as needed for severe pain. 07/13/16   Merlyn Lot, MD  lisinopril (PRINIVIL,ZESTRIL) 2.5 MG tablet Take 1 tablet (2.5 mg total) by mouth daily. 09/05/15   Minna Merritts, MD  Melatonin 5 MG TBDP  Take 5 mg by mouth as needed.    [provider]  Omega-3 Fatty Acids (FISH OIL) 1000 MG CAPS Take 1,200 mg by mouth daily.     [provider]  Phenylephrine-Acetaminophen 5-325 MG TABS Take by mouth as needed.     [provider]  Polyethylene Glycol 3350 (MIRALAX PO) Take by mouth every evening.    [provider]  Probiotic Product (PROBIOTIC DAILY PO) Take by mouth daily.    [provider]  rosuvastatin (CRESTOR) 5 MG tablet TAKE 1 TABLET(5 MG) BY MOUTH DAILY 06/21/17   Minna Merritts, MD  Simethicone 125 MG TABS Take by mouth as needed.     [provider]    Allergies as of 01/23/2017 - Review Complete 09/05/2016  Allergen Reaction Noted  . Alendronate sodium Other (See Comments) 12/01/2014  . Ibandronic acid Other (See Comments) 12/01/2014  . Simvastatin Other (See Comments) 12/01/2014  . Amoxicillin-pot clavulanate Rash 12/01/2014    Family History  Problem Relation Age of Onset  . Heart disease Mother        stent placement  . Heart attack Mother   . Heart disease Father        CABG   . Heart attack Father 58    Social History   Socioeconomic History  .  Marital status: Married    Spouse name: Not on file  . Number of children: Not on file  . Years of education: Not on file  . Highest education level: Not on file  Social Needs  . Financial resource strain: Not on file  . Food insecurity - worry: Not on file  . Food insecurity - inability: Not on file  . Transportation needs - medical: Not on file  . Transportation needs - non-medical: Not on file  Occupational History  . Not on file  Tobacco Use  . Smoking status: Former Smoker    Years: 18.00    Types: Cigarettes    Last attempt to quit: 12/01/1999    Years since quitting: 17.5  . Smokeless tobacco: Never Used  Substance and Sexual Activity  . Alcohol use: Yes  . Drug use: No  . Sexual activity: Not on file  Other Topics Concern  . Not on file   Social History Narrative  . Not on file    Review of Systems: See HPI, otherwise negative ROS  Physical Exam: BP (!) 117/48   Pulse 76   Temp (!) 96.7 F (35.9 C) (Tympanic)   Resp 17   Ht 5\' 3"  (1.6 m)   Wt 49.4 kg (109 lb)   SpO2 100%   BMI 19.31 kg/m  General:   Alert,  pleasant and cooperative in NAD Head:  Normocephalic and atraumatic. Neck:  Supple; no masses or thyromegaly. Lungs:  Clear throughout to auscultation.    Heart:  Regular rate and rhythm. Abdomen:  Soft, nontender and nondistended. Normal bowel sounds, without guarding, and without rebound.   Neurologic:  Alert and  oriented x4;  grossly normal neurologically.  Impression/Plan: Debra Moody is here for an colonoscopy to be performed for [facility] colon polyps.  Risks, benefits, limitations, and alternatives regarding  colonoscopy have been reviewed with the patient.  Questions have been answered.  All parties agreeable.   Gaylyn Cheers, MD  07/01/2017, 12:29 PM

## 2017-07-02 ENCOUNTER — Encounter: Payer: Self-pay | Admitting: Unknown Physician Specialty

## 2017-07-02 NOTE — Anesthesia Postprocedure Evaluation (Signed)
Anesthesia Post Note  Patient: Debra Moody  Procedure(s) Performed: COLONOSCOPY WITH PROPOFOL (N/A )  Patient location during evaluation: PACU Anesthesia Type: General Level of consciousness: awake Pain management: pain level controlled Vital Signs Assessment: post-procedure vital signs reviewed and stable Respiratory status: nonlabored ventilation Cardiovascular status: stable Anesthetic complications: no     Last Vitals:  Vitals:   07/01/17 1316 07/01/17 1326  BP: (!) 157/64 (!) 156/65  Pulse: 88 78  Resp: 14 15  Temp:    SpO2: 100% 100%    Last Pain:  Vitals:   07/01/17 1256  TempSrc: Tympanic                 VAN STAVEREN,Jwan Hornbaker

## 2017-07-08 ENCOUNTER — Telehealth: Payer: Self-pay | Admitting: Cardiovascular Disease

## 2017-07-08 NOTE — Telephone Encounter (Signed)
S/w patient. She's been on lisinopril since 2015. She has had a cough since beginning the lisinopril which has progressively worsened. She feels like she has a cold all the time.   Last office visit with Dr Rockey Situ was January 2018: "Orthostatic hypotension Coreg for falls Tolerating low-dose lisinopril but does have a cough We did discuss possibly changing her to losartan if cough is bothersome"  Patient does not want to have the constant cough and would like to know if there's something else she could take.  Patient is also experiencing night sweats a few times a week.  She is concerned at what may be causing that.  Advised patient to contact PCP concerning night sweats but I would make Dr Rockey Situ aware as well.

## 2017-07-08 NOTE — Telephone Encounter (Signed)
Pt states she thinks her Lisinopril is causing her to cough, she is asking if there is an alternative. Also states she has a lot of night sweats. Please call and advise.

## 2017-07-10 NOTE — Telephone Encounter (Signed)
Left voicemail message to call back  

## 2017-07-10 NOTE — Telephone Encounter (Signed)
Okay to stop lisinopril Would call in Losartan 25 mg daily but cut in half for 12.5 mg dose They do not make 12.5 mg pill

## 2017-07-11 MED ORDER — LOSARTAN POTASSIUM 25 MG PO TABS: 25.0000 mg | ORAL_TABLET | Freq: Every day | ORAL | 3 refills | Status: DC

## 2017-07-11 MED ORDER — LOSARTAN POTASSIUM 25 MG PO TABS: 12.5000 mg | ORAL_TABLET | Freq: Every day | ORAL | 3 refills | Status: DC

## 2017-07-11 NOTE — Telephone Encounter (Signed)
Spoke with patient and reviewed Dr. Donivan Scull recommendations for discontinuing lisinopril and starting on Losartan 25 mg taking 1/2 tablet once daily. She verbalized understanding of our conversation, agreement with plan, and had no further questions at this time.

## 2017-08-17 NOTE — Progress Notes (Signed)
Cardiology Office Note  Date:  08/19/2017   ID:  Debra Moody, DOB [birth year], MRN [id number]  PCP:  Rusty Aus, MD   Chief Complaint  Patient presents with  . OTHER    6 month f/u no complaints today. Meds reviewed verbally with pt.    HPI:  Debra Moody is a pleasant 77 year old woman with previous history of  GERD, diverticulitis,  coronary artery disease,  hysterectomy in the 80s, prior bladder surgery, elbow fracture October 2014, shingles September 2015,  previously seen for chest pain, elevated cardiac enzymes,  stress related cardiomyopathy. She presents today for follow-up of her nonischemic cardiomyopathy  Night sweats Reflux sx, off her PPI  In general she reports that she feels well, denies any cardiac issues, no shortness of breath on exertion, no leg swelling, no abdominal fullness, no PND or orthopnea  Was having falls on coreg.  She stopped the medication, reports that falls have been better Previously was only taking carvedilol 3.125 mg daily  Continues to have periodic diverticulitis Hx of osteoarthritis, trying to avoid falls No regular exercise program  Stress at home taking care of her husband who has cancer Tolerating low-dose Crestor and Zetia No episodes of near syncope or syncope  Significant GERD symptoms  Lab work reviewed with her Total cholesterol 160  Previous echocardiogram showing normal ejection fraction  EKG on today's visit shows normal sinus rhythm with rate 66 bpm, no significant ST or T-wave changes, PVCs noted  Other past medical history  she was traveling out of state, was in [place] when she developed amnesia type symptoms. Her husband took her to the hospital. She had MRI and CT of the head that showed no stroke. Further workup showed abnormal EKG, abnormal echocardiogram, elevated troponin up to more than 3. Ischemia could not be excluded and she was taken to the cardiac catheterization lab 11/03/2014. The report details  baseline normal coronary arteries, probable takotsubo syndrome as there was LV dysfunction, ejection fraction estimated at 25% with anteroapical, apical and apical inferior wall akinesis She was noted to have coronary spasm with a reversible 90% proximal RCA lesion, reversible 50% proximal left circumflex lesion She was discharged home on low-dose carvedilol and lisinopril  Lab work done in the hospital March 2016 shows total cholesterol 199, LDL 113, HDL 79   PMH:   has a past medical history of Diverticulitis, GERD (gastroesophageal reflux disease), History of shingles (2015), Hyperlipidemia, Hypertension, MI (myocardial infarction) ([place]) (11/03/2014), Osteoporosis, and Skin cancer, basal cell.  PSH:    Past Surgical History:  Procedure Laterality Date  . ABDOMINAL HYSTERECTOMY    . CARDIAC CATHETERIZATION  11/03/2014  . COLONOSCOPY WITH PROPOFOL N/A 07/01/2017   Procedure: COLONOSCOPY WITH PROPOFOL;  Surgeon: Manya Silvas, MD;  [facility] ENDOSCOPY;  Service: Endoscopy;  Laterality: N/A;  . GUM SURGERY      Current Outpatient Medications  Medication Sig Dispense Refill  . aspirin 81 MG tablet Take 81 mg by mouth daily.    . Calcium-Magnesium-Vitamin D (CALCIUM 500 PO) Take 2,000 mg by mouth 2 (two) times daily.     . cetirizine (ZYRTEC) 10 MG tablet Take 10 mg by mouth daily as needed.     . Coenzyme Q10 (COQ10) 100 MG CAPS Take 100 mg by mouth daily.    . Denosumab (PROLIA [place]) Inject 1 Units into the skin every 6 (six) months.    . diphenhydrAMINE (BENADRYL) 25 mg capsule Take 25 mg by mouth every 6 (six)  hours as needed.     . docusate sodium (COLACE) 100 MG capsule Take 100 mg by mouth 2 (two) times daily.    Marland Kitchen ezetimibe (ZETIA) 10 MG tablet Take 10 mg by mouth daily.    Marland Kitchen losartan (COZAAR) 25 MG tablet Take 0.5 tablets (12.5 mg total) by mouth daily. 45 tablet 3  . magnesium oxide (MAG-OX) 400 MG tablet Take 400 mg by mouth daily.    . Melatonin 5 MG TBDP Take 5 mg by  mouth as needed.    . Omega-3 Fatty Acids (FISH OIL) 1000 MG CAPS Take 1,200 mg by mouth daily.     . Polyethylene Glycol 3350 (MIRALAX PO) Take by mouth every evening.    . Probiotic Product (PROBIOTIC DAILY PO) Take by mouth daily.    . rosuvastatin (CRESTOR) 5 MG tablet TAKE 1 TABLET(5 MG) BY MOUTH DAILY 30 tablet 3  . Simethicone 125 MG TABS Take by mouth as needed.      No current facility-administered medications for this visit.      Allergies:   Alendronate sodium; Ibandronic acid; Simvastatin; and Amoxicillin-pot clavulanate   Social History:  The patient  reports that she quit smoking about 17 years ago. Her smoking use included cigarettes. She quit after 18.00 years of use. she has never used smokeless tobacco. She reports that she drinks alcohol. She reports that she does not use drugs.   Family History:   family history includes Heart attack in her mother; Heart attack (age of onset: 12) in her father; Heart disease in her father and mother.    Review of Systems: Review of Systems  Constitutional: Negative.   Respiratory: Negative.   Cardiovascular: Negative.   Gastrointestinal: Positive for abdominal pain and heartburn.  Musculoskeletal: Negative.   Neurological: Negative.   Psychiatric/Behavioral: Negative.   All other systems reviewed and are negative.    PHYSICAL EXAM: VS:  BP 108/60 (BP Location: Left Arm, Patient Position: Sitting, Cuff Size: Normal)   Pulse 66   Ht 5\' 3"  (1.6 m)   Wt 111 lb 4 oz (50.5 kg)   BMI 19.71 kg/m  , BMI Body mass index is 19.71 kg/m. GEN: Well nourished, well developed, in no acute distress  HEENT: normal  Neck: no JVD, carotid bruits, or masses Cardiac: RRR; no murmurs, rubs, or gallops,no edema  Respiratory:  clear to auscultation bilaterally, normal work of breathing GI: soft, nontender, nondistended, + BS MS: no deformity or atrophy  Skin: warm and dry, no rash Neuro:  Strength and sensation are intact Psych: euthymic  mood, full affect    Recent Labs: No results found for requested labs within last 8760 hours.    Lipid Panel No results found for: CHOL, HDL, LDLCALC, TRIG    Wt Readings from Last 3 Encounters:  08/19/17 111 lb 4 oz (50.5 kg)  07/01/17 109 lb (49.4 kg)  09/05/16 109 lb 8 oz (49.7 kg)       ASSESSMENT AND PLAN:  Congestive dilated cardiomyopathy ([place]) - Plan: EKG 12-Lead Tolerating low-dose losartan Unable to tolerate carvedilol in the past, had a "fall" Blood pressure borderline low Appears euvolemic, no changes to her medications  Takotsubo syndrome Stressors include husband who has cancer Recommended stress reduction techniques  Mixed hyperlipidemia Dramatic improvement in her cholesterol down 50 points on low-dose Crestor and Zetia  Coronary artery disease involving native coronary artery of native heart without angina pectoris No significant coronary disease noted on prior catheterization, reversible spasm Stable  Orthostatic hypotension With blood pressure stable on current regimen no changes made   Total encounter time more than 25 minutes  Greater than 50% was spent in counseling and coordination of care with the patient   Disposition:   F/U  12 months   Orders Placed This Encounter  Procedures  . EKG 12-Lead     Signed, Esmond Plants, M.D., Ph.D. 08/19/2017  [place], [place]

## 2017-08-19 ENCOUNTER — Encounter: Payer: Self-pay | Admitting: Cardiovascular Disease

## 2017-08-19 ENCOUNTER — Ambulatory Visit (INDEPENDENT_AMBULATORY_CARE_PROVIDER_SITE_OTHER): Payer: Medicare Other | Admitting: Cardiovascular Disease

## 2017-08-19 VITALS — BP 108/60 | HR 66 | Ht 63.0 in | Wt 111.2 lb

## 2017-08-19 DIAGNOSIS — I2510 Atherosclerotic heart disease of native coronary artery without angina pectoris: Secondary | ICD-10-CM | POA: Diagnosis not present

## 2017-08-19 DIAGNOSIS — I420 Dilated cardiomyopathy: Secondary | ICD-10-CM

## 2017-08-19 DIAGNOSIS — E782 Mixed hyperlipidemia: Secondary | ICD-10-CM

## 2017-08-19 DIAGNOSIS — F419 Anxiety disorder, unspecified: Secondary | ICD-10-CM | POA: Diagnosis not present

## 2017-08-19 NOTE — Patient Instructions (Addendum)
Medication Instructions:   For heartburn Take tums several pills Coke Gas-x /simethicone Pepcid/zantac (ranitidine/famotidine), 1 - 2 twice a day   Labwork:  No new labs needed  Testing/Procedures:  No further testing at this time   Follow-Up: It was a pleasure seeing you in the office today. Please call us if you have new issues that need to be addressed before your next appt.  [phone]  Your physician wants you to follow-up in: 12 months.  You will receive a reminder letter in the mail two months in advance. If you don't receive a letter, please call our office to schedule the follow-up appointment.  If you need a refill on your cardiac medications before your next appointment, please call your pharmacy.

## 2017-09-09 ENCOUNTER — Encounter: Payer: Self-pay | Admitting: Medical Oncology

## 2017-09-09 ENCOUNTER — Emergency Department
Admission: EM | Admit: 2017-09-09 | Discharge: 2017-09-09 | Disposition: A | Discharge status: Home / Self Care | Payer: Medicare Other | Attending: Emergency Medicine | Admitting: Emergency Medicine

## 2017-09-09 ENCOUNTER — Emergency Department: Payer: Medicare Other

## 2017-09-09 DIAGNOSIS — R079 Chest pain, unspecified: Secondary | ICD-10-CM | POA: Insufficient documentation

## 2017-09-09 DIAGNOSIS — Z79899 Other long term (current) drug therapy: Secondary | ICD-10-CM | POA: Diagnosis not present

## 2017-09-09 DIAGNOSIS — Z87891 Personal history of nicotine dependence: Secondary | ICD-10-CM | POA: Diagnosis not present

## 2017-09-09 DIAGNOSIS — I2510 Atherosclerotic heart disease of native coronary artery without angina pectoris: Secondary | ICD-10-CM | POA: Insufficient documentation

## 2017-09-09 DIAGNOSIS — Z7982 Long term (current) use of aspirin: Secondary | ICD-10-CM | POA: Insufficient documentation

## 2017-09-09 DIAGNOSIS — I10 Essential (primary) hypertension: Secondary | ICD-10-CM | POA: Diagnosis not present

## 2017-09-09 DIAGNOSIS — R51 Headache: Secondary | ICD-10-CM | POA: Diagnosis present

## 2017-09-09 DIAGNOSIS — G43909 Migraine, unspecified, not intractable, without status migrainosus: Secondary | ICD-10-CM | POA: Insufficient documentation

## 2017-09-09 LAB — TROPONIN I
Troponin I: <0.03 ng/mL (ref <0.03)
Troponin I: <0.03 ng/mL (ref <0.03)

## 2017-09-09 LAB — CBC
HCT: 41.1 % (ref 35.0–47.0)
Hemoglobin: 13.6 g/dL (ref 12.0–16.0)
MCH: 28.4 pg (ref 26.0–34.0)
MCHC: 33.1 g/dL (ref 32.0–36.0)
MCV: 85.7 fL (ref 80.0–100.0)
Platelets: 235 10*3/uL (ref 150–440)
RBC: 4.8 MIL/uL (ref 3.80–5.20)
RDW: 14.1 % (ref 11.5–14.5)
WBC: 5.6 10*3/uL (ref 3.6–11.0)

## 2017-09-09 LAB — BASIC METABOLIC PANEL
Anion gap: 7 (ref 5–15)
BUN: 11 mg/dL (ref 6–20)
CO2: 26 mmol/L (ref 22–32)
Calcium: 9 mg/dL (ref 8.9–10.3)
Chloride: 101 mmol/L (ref 101–111)
Creatinine, Ser: 0.69 mg/dL (ref 0.44–1.00)
GFR calc Af Amer: >60 mL/min (ref >60)
GFR calc non Af Amer: >60 mL/min (ref >60)
Glucose, Bld: 107 mg/dL — ABNORMAL HIGH (ref 65–99)
Potassium: 4.6 mmol/L (ref 3.5–5.1)
Sodium: 134 mmol/L — ABNORMAL LOW (ref 135–145)

## 2017-09-09 MED ORDER — DIPHENHYDRAMINE HCL 25 MG PO CAPS: 25.0000 mg | ORAL_CAPSULE | Freq: Four times a day (QID) | ORAL | 0 refills | Status: DC | PRN

## 2017-09-09 MED ORDER — FAMOTIDINE 20 MG PO TABS: 20.0000 mg | ORAL_TABLET | Freq: Once | ORAL | Status: DC

## 2017-09-09 MED ORDER — IBUPROFEN 400 MG PO TABS: 400.0000 mg | ORAL_TABLET | Freq: Once | ORAL | Status: DC

## 2017-09-09 MED ORDER — METOCLOPRAMIDE HCL 10 MG PO TABS: 10.0000 mg | ORAL_TABLET | Freq: Four times a day (QID) | ORAL | 0 refills | Status: DC | PRN

## 2017-09-09 MED ORDER — IBUPROFEN 200 MG PO TABS: 400.0000 mg | ORAL_TABLET | Freq: Four times a day (QID) | ORAL | 0 refills | Status: DC | PRN

## 2017-09-09 MED ORDER — METOCLOPRAMIDE HCL 10 MG PO TABS
10.0000 mg | ORAL_TABLET | Freq: Once | ORAL | Status: AC
  Administered 2017-09-09: 10 mg via ORAL
  Filled 2017-09-09: qty 1

## 2017-09-09 NOTE — Discharge Instructions (Signed)
Your labs today were reassuring. There are no worrisome changes on her EKG or chest x-ray. Follow-up with your doctors to continue monitoring the symptoms. Take Reglan and Benadryl and ibuprofen as needed for your headache.

## 2017-09-09 NOTE — ED Triage Notes (Signed)
Pt brought over from [facility] with reports of headache that began this am that feels like a band around her head. Pt denies other sx's such as weakness with this pain. Pt also reports she feels somewhat tight in her chest. Denies sob. Pt also concerned that her cheeks are flush.

## 2017-09-09 NOTE — ED Notes (Signed)
Pt alert, oriented, ambulatory. States she went to [facility] but doctor wasn't there so went to [facility]. BP this AM was 100/47. At [facility] BP was 160's. Pt concerned about flushing to face. States HA started after noting flushing to face. States feels like something is wrapped around head. Denies blurred vision. Denies nausea now.   States hx MI in 2016. States came in with tightness in chest but denies any now.

## 2017-09-09 NOTE — ED Notes (Signed)
Dr. Stafford at bedside.  

## 2017-09-09 NOTE — ED Provider Notes (Signed)
[facility] Emergency Department Provider Note  ____________________________________________  Time seen: Approximately 6:52 PM  I have reviewed the triage vital signs and the nursing notes.   HISTORY  Chief Complaint Headache and Chest Pain    HPI Debra Moody is a 77 y.o. female reports waking up this morning with a bandlike headache around her head. She has light sensitivity as well. Nausea but no vomiting. She also felt like her face was flushed. Feels like a migraine headache that she has had in the past although she's not had a migraine in over 10 years since she retired from working. Gradual onset, 5 out of 10 at the worst, currently about 4 out of 10. No vision changes paresthesias or weakness. No lightheadedness or positional changes. No trauma. No fevers or neck stiffness.  In the setting of this, patient tried exercising to relieve the headache which did not help. She did not have any chest pain during this time. However, several hours later between 11 AM and 1 PM today, she had central chest tightness which was nonradiating and not associated with shortness of breath vomiting or diaphoresis. Not exertional, not pleuritic. It resolved on its own prior to arrival in the ED. No recurrence of pain here in the ED. Does not feel like her prior MI.     Past Medical History:  Diagnosis Date  . Diverticulitis   . GERD (gastroesophageal reflux disease)   . History of shingles 2015  . Hyperlipidemia   . Hypertension   . MI (myocardial infarction) ([place]) 11/03/2014   Severe damage to the left ventricle   . Osteoporosis   . Skin cancer, basal cell      Patient Active Problem List   Diagnosis Date Noted  . Coronary artery disease involving native coronary artery of native heart without angina pectoris 09/05/2016  . Orthostatic hypotension 03/07/2015  . Congestive dilated cardiomyopathy ([place]) 12/01/2014  . Anxiety 12/01/2014  . Takotsubo syndrome 12/01/2014   . Hyperlipidemia 12/01/2014     Past Surgical History:  Procedure Laterality Date  . ABDOMINAL HYSTERECTOMY    . CARDIAC CATHETERIZATION  11/03/2014  . COLONOSCOPY WITH PROPOFOL N/A 07/01/2017   Procedure: COLONOSCOPY WITH PROPOFOL;  Surgeon: Manya Silvas, MD;  Location: [facility] ENDOSCOPY;  Service: Endoscopy;  Laterality: N/A;  . GUM SURGERY       Prior to Admission medications   Medication Sig Start Date End Date Taking? Authorizing Provider  aspirin 81 MG tablet Take 81 mg by mouth daily.    [provider]  Calcium-Magnesium-Vitamin D (CALCIUM 500 PO) Take 2,000 mg by mouth 2 (two) times daily.     [provider]  cetirizine (ZYRTEC) 10 MG tablet Take 10 mg by mouth daily as needed.     [provider]  Coenzyme Q10 (COQ10) 100 MG CAPS Take 100 mg by mouth daily.    [provider]  Denosumab (PROLIA [place]) Inject 1 Units into the skin every 6 (six) months.    [provider]  diphenhydrAMINE (BENADRYL) 25 mg capsule Take 25 mg by mouth every 6 (six) hours as needed.     [provider]  diphenhydrAMINE (BENADRYL) 25 mg capsule Take 1 capsule (25 mg total) by mouth every 6 (six) hours as needed. 09/09/17   Carrie Mew, MD  docusate sodium (COLACE) 100 MG capsule Take 100 mg by mouth 2 (two) times daily.    [provider]  ezetimibe (ZETIA) 10 MG tablet Take 10 mg  by mouth daily.    [provider]  ibuprofen (MOTRIN IB) 200 MG tablet Take 2 tablets (400 mg total) by mouth every 6 (six) hours as needed. 09/09/17   Carrie Mew, MD  losartan (COZAAR) 25 MG tablet Take 0.5 tablets (12.5 mg total) by mouth daily. 07/11/17 10/09/17  Minna Merritts, MD  magnesium oxide (MAG-OX) 400 MG tablet Take 400 mg by mouth daily.    [provider]  Melatonin 5 MG TBDP Take 5 mg by mouth as needed.    [provider]  metoCLOPramide (REGLAN) 10 MG tablet Take 1 tablet (10 mg total) by mouth every 6  (six) hours as needed. 09/09/17   Carrie Mew, MD  Omega-3 Fatty Acids (FISH OIL) 1000 MG CAPS Take 1,200 mg by mouth daily.     [provider]  Polyethylene Glycol 3350 (MIRALAX PO) Take by mouth every evening.    [provider]  Probiotic Product (PROBIOTIC DAILY PO) Take by mouth daily.    [provider]  rosuvastatin (CRESTOR) 5 MG tablet TAKE 1 TABLET(5 MG) BY MOUTH DAILY 06/21/17   Minna Merritts, MD  Simethicone 125 MG TABS Take by mouth as needed.     [provider]     Allergies Alendronate sodium; Ibandronic acid; Simvastatin; and Amoxicillin-pot clavulanate   Family History  Problem Relation Age of Onset  . Heart disease Mother        stent placement  . Heart attack Mother   . Heart disease Father        CABG   . Heart attack Father 17    Social History Social History   Tobacco Use  . Smoking status: Former Smoker    Years: 18.00    Types: Cigarettes    Last attempt to quit: 12/01/1999    Years since quitting: 17.7  . Smokeless tobacco: Never Used  Substance Use Topics  . Alcohol use: Yes  . Drug use: No    Review of Systems  Constitutional:   No fever or chills.  ENT:   No sore throat. No rhinorrhea. Cardiovascular:   Positive as above chest pain without lightheadedness or syncope. Respiratory:   No dyspnea or cough. Gastrointestinal:   Negative for abdominal pain, vomiting and diarrhea.  Musculoskeletal:   Negative for focal pain or swelling All other systems reviewed and are negative except as documented above in ROS and HPI.  ____________________________________________   PHYSICAL EXAM:  VITAL SIGNS: ED Triage Vitals [09/09/17 1345]  Enc Vitals Group     BP (!) 141/63     Pulse Rate 67     Resp 18     Temp 98.9 F (37.2 C)     Temp Source Oral     SpO2 100 %     Weight 111 lb (50.3 kg)     Height 5\' 3"  (1.6 m)     Head Circumference      Peak Flow      Pain Score 4     Pain Loc      Pain  Edu?      Excl. in [place]?     Vital signs reviewed, nursing assessments reviewed.   Constitutional:   Alert and oriented. Well appearing and in no distress. Eyes:   No scleral icterus.  EOMI. No nystagmus. No conjunctival pallor. PERRL. ENT   Head:   Normocephalic and atraumatic.   Nose:   No congestion/rhinnorhea.    Mouth/Throat:   MMM, no  pharyngeal erythema. No peritonsillar mass.    Neck:   No meningismus. Full ROM. Hematological/Lymphatic/Immunilogical:   No cervical lymphadenopathy. Cardiovascular:   RRR. Symmetric bilateral radial and DP pulses.  No murmurs.  Respiratory:   Normal respiratory effort without tachypnea/retractions. Breath sounds are clear and equal bilaterally. No wheezes/rales/rhonchi. Gastrointestinal:   Soft and nontender. Non distended. There is no CVA tenderness.  No rebound, rigidity, or guarding. Genitourinary:   deferred Musculoskeletal:   Normal range of motion in all extremities. No joint effusions.  No lower extremity tenderness.  No edema. Neurologic:   Normal speech and language.  Positive photophobia on exam. Headache worsened with extraocular movements as well Cranial nerves III through XII intact Motor grossly intact. No acute focal neurologic deficits are appreciated.  Skin:    Skin is warm, dry and intact. No rash noted.  No petechiae, purpura, or bullae.  ____________________________________________    LABS (pertinent positives/negatives) (all labs ordered are listed, but only abnormal results are displayed) Labs Reviewed  BASIC METABOLIC PANEL - Abnormal; Notable for the following components:      Result Value   Sodium 134 (*)    Glucose, Bld 107 (*)    All other components within normal limits  CBC  TROPONIN I  TROPONIN I   ____________________________________________   EKG  Interpreted by me  Date: 09/09/2017  Rate: 69  Rhythm: normal sinus rhythm  QRS Axis: normal  Intervals: normal  ST/T Wave abnormalities:  normal  Conduction Disutrbances: none  Narrative Interpretation: unremarkable      ____________________________________________    RADIOLOGY  Dg Chest 2 View  Result Date: 09/09/2017 CLINICAL DATA:  Chest pain EXAM: CHEST  2 VIEW COMPARISON:  None. FINDINGS: Lungs are clear.  No pleural effusion or pneumothorax. Heart is normal in size. Visualized osseous structures are within normal limits. IMPRESSION: Normal chest radiographs. Electronically Signed   By: Julian Hy M.D.   On: 09/09/2017 14:21    ____________________________________________   PROCEDURES Procedures  ____________________________________________    CLINICAL IMPRESSION / ASSESSMENT AND PLAN / ED COURSE  Pertinent labs & imaging results that were available during my care of the patient were reviewed by me and considered in my medical decision making (see chart for details).   Patient presents with an episode of atypical chest pain this morning in the setting of a migraine headache consistent with establish migraine pattern even though she hasn't had a headache in a long time. Neurologically intact. Exam is unremarkable.Considering the patient's symptoms, medical history, and physical examination today, I have low suspicion for ACS, PE, TAD, pneumothorax, carditis, mediastinitis, pneumonia, CHF, or sepsis. Low suspicion for stroke intracranial hemorrhage meningitis encephalitis or intracranial hypertension glaucoma or temporal arteritis.  Offered migraine cocktail but due to Medicare benefit concerns, patient declines NSAIDs and antihistamines in the ED. I given her Reglan and will write her prescriptions. 2 troponins negative. EKG normal. Chest x-ray normal.        ____________________________________________   FINAL CLINICAL IMPRESSION(S) / ED DIAGNOSES    Final diagnoses:  Migraine without status migrainosus, not intractable, unspecified migraine type  Nonspecific chest pain        Portions of this note were generated with dragon dictation software. Dictation errors may occur despite best attempts at proofreading.    Carrie Mew, MD 09/09/17 [phone]

## 2017-09-10 ENCOUNTER — Telehealth: Payer: Self-pay | Admitting: Cardiovascular Disease

## 2017-09-10 NOTE — Telephone Encounter (Signed)
Pt was seen in ED on 09/09/17  She was seen for Chest pains and headache Has seen Dr Rockey Situ in past  Will try again to schedule follow up appointment

## 2017-09-12 NOTE — Telephone Encounter (Signed)
Pt scheduled for 10/22/17  Nothing else needed.

## 2017-10-20 NOTE — Progress Notes (Signed)
Cardiology Office Note  Date:  10/22/2017   ID:  Debra Moody, DOB Oct 10, 1940, MRN [id number]  PCP:  Rusty Aus, MD   Chief Complaint  Patient presents with  . OTHER    F/u ED chest pain/reflux. Meds reviewed verbally with pt.    HPI:  Ms. Debra Moody is a pleasant 77 year old woman with previous history of  GERD, diverticulitis,  hysterectomy in the 80s, prior bladder surgery, elbow fracture October 2014, shingles September 2015,  previously seen for chest pain, elevated cardiac enzymes,  stress related cardiomyopathy. 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  Presented to the emergency room September 09, 2017 Headache, light sensitivity, nausea, face flash, felt like a migraine Several hours later had central chest tightness nonradiating Chest pain resolved on its own prior to arrival in the emergency room  On prior office visit she reported having Night sweats  She is having more shortness of breath with exercise Uncertain if this is a problem, cannot explain it  Fine when walking and doing basic activities Reports through the winter she was exercising 3 days a week  periodic diverticulitis Hx of osteoarthritis  Stress at home taking care of her husband who has cancer Tolerating low-dose Crestor and Zetia No episodes of near syncope or syncope  Significant GERD symptoms  no leg swelling, no abdominal fullness, no PND or orthopnea  Lab work reviewed with her in detail LDL 81 Total chol160 HBA1`C 6.0  EKG personally reviewed by myself on todays visit   Shows normal sinus rhythm rate 61 bpm no significant ST or T wave changes  Other past medical history reviewed Was having falls on coreg.  She stopped the medication, reports that falls have been better Previously was only taking carvedilol 3.125 mg daily   she was  traveling out of state, was in [place] when she developed amnesia type symptoms. Her husband took her to the hospital. She had MRI and CT of the head that showed no stroke. Further workup showed abnormal EKG, abnormal echocardiogram, elevated troponin up to more than 3. Ischemia could not be excluded and she was taken to the cardiac catheterization lab 11/03/2014. 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 as there was LV dysfunction, ejection fraction estimated at 25% with anteroapical, apical and apical inferior wall akinesis  She was noted to have coronary spasm with a reversible 90% proximal RCA lesion, reversible 50% proximal left circumflex lesion She was discharged home on low-dose carvedilol and lisinopril  Lab work done in the hospital March 2016 shows total cholesterol 199, LDL 113, HDL 79   PMH:   has a past medical history of Diverticulitis, GERD (gastroesophageal reflux disease), History of shingles (2015), Hyperlipidemia, Hypertension, MI (myocardial infarction) ([place]) (11/03/2014), Osteoporosis, and Skin cancer, basal cell.  PSH:    Past Surgical History:  Procedure Laterality Date  . ABDOMINAL HYSTERECTOMY    . CARDIAC CATHETERIZATION  11/03/2014  . COLONOSCOPY WITH PROPOFOL N/A 07/01/2017   Procedure: COLONOSCOPY WITH PROPOFOL;  Surgeon: Manya Silvas, MD;  Location: [facility] ENDOSCOPY;  Service: Endoscopy;  Laterality: N/A;  . GUM SURGERY      Current Outpatient Medications  Medication Sig Dispense Refill  . aspirin 81 MG tablet Take 81 mg by mouth daily.    . Calcium-Magnesium-Vitamin D (CALCIUM 500 PO) Take 2,000 mg by mouth 2 (  two) times daily.     . cetirizine (ZYRTEC) 10 MG tablet Take 10 mg by mouth daily as needed.     . Coenzyme Q10 (COQ10) 100 MG CAPS Take 100 mg by mouth daily.    . Denosumab (PROLIA [place]) Inject 1 Units into the skin every 6 (six) months.    . diphenhydrAMINE (BENADRYL) 25 mg capsule Take 25 mg by mouth  every 6 (six) hours as needed.     . diphenhydrAMINE (BENADRYL) 25 mg capsule Take 1 capsule (25 mg total) by mouth every 6 (six) hours as needed. 60 capsule 0  . docusate sodium (COLACE) 100 MG capsule Take 100 mg by mouth 2 (two) times daily.    Marland Kitchen ezetimibe (ZETIA) 10 MG tablet Take 10 mg by mouth daily.    Marland Kitchen ibuprofen (MOTRIN IB) 200 MG tablet Take 2 tablets (400 mg total) by mouth every 6 (six) hours as needed. 60 tablet 0  . magnesium oxide (MAG-OX) 400 MG tablet Take 400 mg by mouth daily.    . Melatonin 5 MG TBDP Take 5 mg by mouth as needed.    . metoCLOPramide (REGLAN) 10 MG tablet Take 1 tablet (10 mg total) by mouth every 6 (six) hours as needed. 30 tablet 0  . Omega-3 Fatty Acids (FISH OIL) 1000 MG CAPS Take 1,200 mg by mouth daily.     . Polyethylene Glycol 3350 (MIRALAX PO) Take by mouth every evening.    . Probiotic Product (PROBIOTIC DAILY PO) Take by mouth daily.    . ranitidine (ZANTAC) 150 MG tablet Take 150 mg by mouth as needed for heartburn.    . rosuvastatin (CRESTOR) 5 MG tablet TAKE 1 TABLET(5 MG) BY MOUTH DAILY 30 tablet 3  . Simethicone 125 MG TABS Take by mouth as needed.     Marland Kitchen losartan (COZAAR) 25 MG tablet Take 0.5 tablets (12.5 mg total) by mouth daily. 45 tablet 3   No current facility-administered medications for this visit.      Allergies:   Alendronate sodium; Ibandronic acid; Simvastatin; and Amoxicillin-pot clavulanate   Social History:  The patient  reports that she quit smoking about 17 years ago. Her smoking use included cigarettes. She quit after 18.00 years of use. she has never used smokeless tobacco. She reports that she drinks alcohol. She reports that she does not use drugs.   Family History:   family history includes Heart attack in her mother; Heart attack (age of onset: 43) in her father; Heart disease in her father and mother.    Review of Systems: Review of Systems  Constitutional: Negative.   Respiratory: Positive for shortness of  breath.   Cardiovascular: Negative.   Gastrointestinal: Negative.   Musculoskeletal: Negative.   Neurological: Negative.   Psychiatric/Behavioral: Negative.   All other systems reviewed and are negative.    PHYSICAL EXAM: VS:  BP 110/60 (BP Location: Left Arm, Patient Position: Sitting, Cuff Size: Normal)   Pulse 61   Ht 5\' 3"  (1.6 m)   Wt 113 lb 12 oz (51.6 kg)   BMI 20.15 kg/m  , BMI Body mass index is 20.15 kg/m. Constitutional:  oriented to person, place, and time. No distress.  HENT:  Head: Normocephalic and atraumatic.  Eyes:  no discharge. No scleral icterus.  Neck: Normal range of motion. Neck supple. No JVD present.  Cardiovascular: Normal rate, regular rhythm, normal heart sounds and intact distal pulses. Exam reveals no gallop and no friction rub. No edema No murmur heard.  Pulmonary/Chest: Effort normal and breath sounds normal. No stridor. No respiratory distress.  no wheezes.  no rales.  no tenderness.  Abdominal: Soft.  no distension.  no tenderness.  Musculoskeletal: Normal range of motion.  no  tenderness or deformity.  Neurological:  normal muscle tone. Coordination normal. No atrophy Skin: Skin is warm and dry. No rash noted. not diaphoretic.  Psychiatric:  normal mood and affect. behavior is normal. Thought content normal.      Recent Labs: 09/09/2017: BUN 11; Creatinine, Ser 0.69; Hemoglobin 13.6; Platelets 235; Potassium 4.6; Sodium 134    Lipid Panel No results found for: CHOL, HDL, LDLCALC, TRIG    Wt Readings from Last 3 Encounters:  10/22/17 113 lb 12 oz (51.6 kg)  09/09/17 111 lb (50.3 kg)  08/19/17 111 lb 4 oz (50.5 kg)       ASSESSMENT AND PLAN:  Congestive dilated cardiomyopathy ([place]) - Plan: EKG 12-Lead Tolerating low-dose losartan Unable to tolerate carvedilol in the past, had a "fall" Appears euvolemic, no changes to her medications She does report some shortness of breath on heavy exertion We did offer echocardiogram if  symptoms get worse or do not improve Unable to exclude conditioning, not doing exercise  Takotsubo syndrome Stressors include husband who has cancer Recommended stress reduction techniques She does regular exercise program more recently  Mixed hyperlipidemia Cholesterol is at goal on the current lipid regimen. No changes to the medications were made.  Coronary artery disease involving native coronary artery of native heart without angina pectoris No significant coronary disease noted on prior catheterization, reversible spasm Stable  Orthostatic hypotension Blood pressure stable on low-dose losartan   Total encounter time more than 25 minutes  Greater than 50% was spent in counseling and coordination of care with the patient   Disposition:   F/U  12 months   Orders Placed This Encounter  Procedures  . EKG 12-Lead     Signed, Esmond Plants, M.D., Ph.D. 10/22/2017  [place], [place]

## 2017-10-22 ENCOUNTER — Ambulatory Visit (INDEPENDENT_AMBULATORY_CARE_PROVIDER_SITE_OTHER): Payer: Medicare Other | Admitting: Cardiovascular Disease

## 2017-10-22 ENCOUNTER — Encounter: Payer: Self-pay | Admitting: Cardiovascular Disease

## 2017-10-22 VITALS — BP 110/60 | HR 61 | Ht 63.0 in | Wt 113.8 lb

## 2017-10-22 DIAGNOSIS — I951 Orthostatic hypotension: Secondary | ICD-10-CM

## 2017-10-22 DIAGNOSIS — I5181 Takotsubo syndrome: Secondary | ICD-10-CM | POA: Diagnosis not present

## 2017-10-22 DIAGNOSIS — I420 Dilated cardiomyopathy: Secondary | ICD-10-CM

## 2017-10-22 DIAGNOSIS — I2510 Atherosclerotic heart disease of native coronary artery without angina pectoris: Secondary | ICD-10-CM

## 2017-10-22 DIAGNOSIS — E782 Mixed hyperlipidemia: Secondary | ICD-10-CM | POA: Diagnosis not present

## 2017-10-22 DIAGNOSIS — R0602 Shortness of breath: Secondary | ICD-10-CM

## 2017-10-22 DIAGNOSIS — F419 Anxiety disorder, unspecified: Secondary | ICD-10-CM | POA: Diagnosis not present

## 2017-10-22 MED ORDER — NITROGLYCERIN 0.3 MG SL SUBL: 0.3000 mg | SUBLINGUAL_TABLET | SUBLINGUAL | 3 refills | Status: DC | PRN

## 2017-10-22 NOTE — Patient Instructions (Signed)
Medication Instructions:   Take NTG 0.3 as needed for chest tightness Script already called in  Labwork:  No new labs needed  Testing/Procedures:  No further testing at this time   Follow-Up: It was a pleasure seeing you in the office today. Please call us if you have new issues that need to be addressed before your next appt.  [phone]  Your physician wants you to follow-up in: 12 months.  You will receive a reminder letter in the mail two months in advance. If you don't receive a letter, please call our office to schedule the follow-up appointment.  If you need a refill on your cardiac medications before your next appointment, please call your pharmacy.  For educational health videos Log in to : [email or web address] Or : [email or web address], password : [email or web address]

## 2017-10-25 ENCOUNTER — Other Ambulatory Visit: Payer: Self-pay | Admitting: Cardiovascular Disease

## 2017-12-16 ENCOUNTER — Other Ambulatory Visit: Payer: Self-pay | Admitting: Internal Medicine

## 2017-12-16 DIAGNOSIS — Z1231 Encounter for screening mammogram for malignant neoplasm of breast: Secondary | ICD-10-CM

## 2018-01-19 ENCOUNTER — Encounter: Payer: Self-pay | Admitting: Cardiovascular Disease

## 2018-01-20 ENCOUNTER — Encounter: Payer: Self-pay | Admitting: *Deleted

## 2018-03-13 ENCOUNTER — Ambulatory Visit
Admission: RE | Admit: 2018-03-13 | Discharge: 2018-03-13 | Disposition: A | Discharge status: Home / Self Care | Payer: Medicare Other | Source: Ambulatory Visit | Attending: Internal Medicine | Admitting: Internal Medicine

## 2018-03-13 DIAGNOSIS — Z1231 Encounter for screening mammogram for malignant neoplasm of breast: Secondary | ICD-10-CM | POA: Diagnosis present

## 2018-04-15 ENCOUNTER — Telehealth: Payer: Self-pay | Admitting: Cardiovascular Disease

## 2018-04-15 DIAGNOSIS — I420 Dilated cardiomyopathy: Secondary | ICD-10-CM

## 2018-04-15 NOTE — Telephone Encounter (Signed)
Pt c/o of Chest Pain: STAT if CP now or developed within 24 hours  1. Are you having CP right now? no  2. Are you experiencing any other symptoms (ex. SOB, nausea, vomiting, sweating)? Just tightness in chest  3. How long have you been experiencing CP? 3 episodes in the last 2 months. Took a Nitro all 3 times  4. Is your CP continuous or coming and going?  During the episodes, it was continual, a "grabbing" pain  5. Have you taken Nitroglycerin? Yes, 1 Nitro each episode.  ?

## 2018-04-15 NOTE — Telephone Encounter (Signed)
Returned the call to the patient. She stated that over the last few months that she has had chest pressure and sharp pain on 3 occasions (pain a 4/10) This has occurred on exertion only and was relieved with one nitro. The pain was non radiating and she denies shortness of breath.  At the last office visit it was mentioned that she could have an echo. She would like to know if this is a possibility.   She would also like to know if she needs to stay on the Zetia since she is tolerating the rosuvastatin well. Her current labs are in care everywhere.  She will go to the ED if the pain comes back and is not relieved with nitro.

## 2018-04-16 NOTE — Telephone Encounter (Signed)
Returned the call to the patient and advised her of Dr. Donivan Scull recommendations. She did request to have an echo done. Order has been placed and message sent to scheduling.

## 2018-04-16 NOTE — Telephone Encounter (Signed)
Patient informed that we are waiting on the provider's recommendations.

## 2018-04-16 NOTE — Telephone Encounter (Signed)
Patient calling to check on MD response status.  Please call.

## 2018-04-16 NOTE — Telephone Encounter (Signed)
Prior catheterization 3 years ago with no blockages It did show spasm Certainly possible her chest pain is for recurrent spasm Echo can be ordered if she feels new symptoms Prior stress cardiomyopathy ejection fraction 25% that returned back to normal and follow-up

## 2018-04-16 NOTE — Telephone Encounter (Signed)
Pt is calling to let us know she will be going out of town today and request a call before 5.

## 2018-04-18 ENCOUNTER — Other Ambulatory Visit: Payer: Self-pay

## 2018-04-18 ENCOUNTER — Ambulatory Visit (INDEPENDENT_AMBULATORY_CARE_PROVIDER_SITE_OTHER): Payer: Medicare Other

## 2018-04-18 DIAGNOSIS — I420 Dilated cardiomyopathy: Secondary | ICD-10-CM

## 2018-05-17 ENCOUNTER — Other Ambulatory Visit: Payer: Self-pay | Admitting: Cardiovascular Disease

## 2018-06-04 ENCOUNTER — Other Ambulatory Visit: Payer: Self-pay | Admitting: Cardiovascular Disease

## 2018-12-07 ENCOUNTER — Other Ambulatory Visit: Payer: Self-pay | Admitting: Cardiovascular Disease

## 2019-01-04 ENCOUNTER — Other Ambulatory Visit: Payer: Self-pay | Admitting: Cardiovascular Disease

## 2019-01-07 ENCOUNTER — Telehealth: Payer: Self-pay

## 2019-01-07 ENCOUNTER — Telehealth: Payer: Self-pay | Admitting: Cardiovascular Disease

## 2019-01-07 NOTE — Telephone Encounter (Signed)
Patient states she has been having some "flushing". States she gets really hot. Her PCP has ruled out rosacea. Pt has been on Doxycylince and Cortisone. Please call to discuss. Denies any BP issues, nausea, CP. States sometimes she does get SOB, but not severe. States she exercises daily.

## 2019-01-07 NOTE — Telephone Encounter (Signed)
VERBAL CONSENT OBTAINED   YOUR CARDIOLOGY TEAM HAS ARRANGED FOR AN E-VISIT FOR YOUR APPOINTMENT - PLEASE REVIEW IMPORTANT INFORMATION BELOW SEVERAL DAYS PRIOR TO YOUR APPOINTMENT  Due to the recent COVID-19 pandemic, we are transitioning in-person office visits to tele-medicine visits in an effort to decrease unnecessary exposure to our patients, their families, and staff. These visits are billed to your insurance just like a normal visit is. We also encourage you to sign up for MyChart if you have not already done so. You will need a smartphone if possible. For patients that do not have this, we can still complete the visit using a regular telephone but do prefer a smartphone to enable video when possible. You may have a family member that lives with you that can help. If possible, we also ask that you have a blood pressure cuff and scale at home to measure your blood pressure, heart rate and weight prior to your scheduled appointment. Patients with clinical needs that need an in-person evaluation and testing will still be able to come to the office if absolutely necessary. If you have any questions, feel free to call our office.   . IF USING DOXIMITY or [email or web address] - The staff will give you instructions on receiving your link to join the meeting the day of your visit.      2-3 DAYS BEFORE YOUR APPOINTMENT  You will receive a telephone call from one of our [place] team members - your caller ID may say "Unknown caller." If this is a video visit, we will walk you through how to get the video launched on your phone. We will remind you check your blood pressure, heart rate and weight prior to your scheduled appointment. If you have an Apple Watch or Kardia, please upload any pertinent ECG strips the day before or morning of your appointment to [place]. Our staff will also make sure you have reviewed the consent and agree to move forward with your scheduled tele-health visit.     THE DAY OF YOUR  APPOINTMENT  Approximately 15 minutes prior to your scheduled appointment, you will receive a telephone call from one of [place] team - your caller ID may say "Unknown caller."  Our staff will confirm medications, vital signs for the day and any symptoms you may be experiencing. Please have this information available prior to the time of visit start. It may also be helpful for you to have a pad of paper and pen handy for any instructions given during your visit. They will also walk you through joining the smartphone meeting if this is a video visit.    CONSENT FOR TELE-HEALTH VISIT - PLEASE REVIEW  I hereby voluntarily request, consent and authorize [facility] and its employed or contracted physicians, physician assistants, nurse practitioners or other licensed health care professionals (the Practitioner), to provide me with telemedicine health care services (the "Services") as deemed necessary by the treating Practitioner. I acknowledge and consent to receive the Services by the Practitioner via telemedicine. I understand that the telemedicine visit will involve communicating with the Practitioner through live audiovisual communication technology and the disclosure of certain medical information by electronic transmission. I acknowledge that I have been given the opportunity to request an in-person assessment or other available alternative prior to the telemedicine visit and am voluntarily participating in the telemedicine visit.  I understand that I have the right to withhold or withdraw my consent to the use of telemedicine in the course of my care at any  time, without affecting my right to future care or treatment, and that the Practitioner or I may terminate the telemedicine visit at any time. I understand that I have the right to inspect all information obtained and/or recorded in the course of the telemedicine visit and may receive copies of available information for a reasonable fee.  I  understand that some of the potential risks of receiving the Services via telemedicine include:  Marland Kitchen Delay or interruption in medical evaluation due to technological equipment failure or disruption; . Information transmitted may not be sufficient (e.g. poor resolution of images) to allow for appropriate medical decision making by the Practitioner; and/or  . In rare instances, security protocols could fail, causing a breach of personal health information.  Furthermore, I acknowledge that it is my responsibility to provide information about my medical history, conditions and care that is complete and accurate to the best of my ability. I acknowledge that Practitioner's advice, recommendations, and/or decision may be based on factors not within their control, such as incomplete or inaccurate data provided by me or distortions of diagnostic images or specimens that may result from electronic transmissions. I understand that the practice of medicine is not an exact science and that Practitioner makes no warranties or guarantees regarding treatment outcomes. I acknowledge that I will receive a copy of this consent concurrently upon execution via email to the email address I last provided but may also request a printed copy by calling the office of [facility].    I understand that my insurance will be billed for this visit.   I have read or had this consent read to me. . I understand the contents of this consent, which adequately explains the benefits and risks of the Services being provided via telemedicine.  . I have been provided ample opportunity to ask questions regarding this consent and the Services and have had my questions answered to my satisfaction. . I give my informed consent for the services to be provided through the use of telemedicine in my medical care  By participating in this telemedicine visit I agree to the above.

## 2019-01-07 NOTE — Telephone Encounter (Addendum)
Spoke with the pt. Pt sts that she was seen by her pcp with complaints of "flushing" and "feeling hot". Her  pcp thought it may be rosacea, he prescribed doxycycline and cortisone. Pt sees a dermatologist regularly, she has a hx of skin cancer, her pcp had also recommended f/u with dermatology but she has not contacted them Pt sts that she has not had any improvement in her symptoms. She denies chest pain, palpitations,sob, n/v, diaphoresis.  She monitors her BP and HR regularly, and her readings have been good. Pt BP this morning 107/50.  She was due for f/u with Dr. Rockey Situ in March 2020. Pt sts that Gabriel Cirri told her that Dr. Donivan Scull in office appt first available is July. Adv the pt that Dr. Rockey Situ does have availability for virtual appts. Pt is agreeable with scheduling a virtual visit. She does have access to a smart device. appt scheduled for 6/2 @ 9:40 am. Adv the pt that's she will receive a call 15-20 min prior to the appt. Pt willis able to ck her BP and HR at home, she will have readings available for the appt. Verbal consent obtained.  Adv the pt to f/u with her pcp and dermatologist for non-cardiac complaints. Pt verbalized understanding.

## 2019-01-12 NOTE — Progress Notes (Signed)
Virtual Visit via Video Note   This visit type was conducted due to national recommendations for restrictions regarding the COVID-19 Pandemic (e.g. social distancing) in an effort to limit this patient's exposure and mitigate transmission in our community.  Due to her co-morbid illnesses, this patient is at least at moderate risk for complications without adequate follow up.  This format is felt to be most appropriate for this patient at this time.  All issues noted in this document were discussed and addressed.  A limited physical exam was performed with this format.  Please refer to the patient's chart for her consent to telehealth for [facility]   I connected with  Debra Moody on 01/13/19 by a video enabled telemedicine application and verified that I am speaking with the correct person using two identifiers. I discussed the limitations of evaluation and management by telemedicine. The patient expressed understanding and agreed to proceed.   Evaluation Performed:  Follow-up visit  Date:  01/13/2019   ID:  KI LUCKMAN, DOB [birth year], MRN [id number]  Patient Location:  [place] [address]   Provider location:   [facility], [place] office  PCP:  Rusty Aus, MD  Cardiologist:  Debra Moody [facility]   Chief Complaint:      History of Present Illness:    Debra Moody is a 78 y.o. female who presents via audio/video conferencing for a telehealth visit today.   The patient does not symptoms concerning for COVID-19 infection (fever, chills, cough, or new SHORTNESS OF BREATH).   Patient has a past medical history of GERD, diverticulitis,  hysterectomy in the 80s, prior bladder surgery, elbow fracture October 2014, shingles September 2015,  previously seen for chest pain, elevated cardiac enzymes,  stress related cardiomyopathy. 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  Dx with rosacea Assoc with flushing, h/a Doxy and steroids Now on metronitizole Getting better  Rare chest tightness, takes NTG  exercising 7 days a week No SOB, walking  Stress at home taking care of her husband who has cancer Tolerating low-dose Crestor and Zetia No episodes of near syncope or syncope  Prior GERD symptoms  Lab work reviewed with her in detail LDL 81 Total chol160 HBA1`C 5.9  Other past medical history reviewed Presented to the emergency room September 09, 2017 migraine Several hours later had central chest tightness nonradiating Chest pain resolved on its own  Previous Night sweats  periodic diverticulitis Hx of osteoarthritis  Was having falls on coreg.  She stopped the medication, reports that falls have been better Previously was only taking carvedilol 3.125 mg daily  she was traveling out of state, was in [place] when she developed amnesia type symptoms. Her husband took her to the hospital. She had MRI and CT of the head that showed no stroke. Further workup showed abnormal EKG, abnormal echocardiogram, elevated troponin up to more than 3. Ischemia could not be excluded and she was taken to the cardiac catheterization lab 11/03/2014. 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 as there was LV dysfunction, ejection fraction estimated at 25% with anteroapical, apical and apical inferior wall akinesis  She was noted to have coronary spasm with a reversible 90% proximal RCA lesion, reversible 50% proximal left circumflex lesion She was discharged home on low-dose  carvedilol and lisinopril   Prior CV studies:   The following studies were reviewed today:    Past Medical History:  Diagnosis Date  . Diverticulitis   . GERD (gastroesophageal reflux disease)   . History of shingles 2015  . Hyperlipidemia   . Hypertension    . MI (myocardial infarction) ([place]) 11/03/2014   Severe damage to the left ventricle   . Osteoporosis    bone density test done 2019  . Skin cancer, basal cell    Past Surgical History:  Procedure Laterality Date  . ABDOMINAL HYSTERECTOMY    . CARDIAC CATHETERIZATION  11/03/2014  . COLONOSCOPY WITH PROPOFOL N/A 07/01/2017   Procedure: COLONOSCOPY WITH PROPOFOL;  Surgeon: Debra Silvas, MD;  Location: [facility] ENDOSCOPY;  Service: Endoscopy;  Laterality: N/A;  . GUM SURGERY       Current Meds  Medication Sig  . aspirin 81 MG tablet Take 81 mg by mouth daily.  . Calcium-Magnesium-Vitamin D (CALCIUM 500 PO) Take 2,000 mg by mouth 2 (two) times daily.   . ciclopirox (PENLAC) 8 % solution APP 1 ML TOPICALLY TO NAILS HS  . Coenzyme Q10 (COQ10) 100 MG CAPS Take 100 mg by mouth daily.  . Denosumab (PROLIA [place]) Inject 1 Units into the skin every 6 (six) months.  . diphenhydrAMINE (BENADRYL) 25 mg capsule Take 25 mg by mouth every 6 (six) hours as needed.   . docusate sodium (COLACE) 100 MG capsule Take 100 mg by mouth 2 (two) times daily.  Marland Kitchen ibuprofen (MOTRIN IB) 200 MG tablet Take 2 tablets (400 mg total) by mouth every 6 (six) hours as needed.  Marland Kitchen losartan (COZAAR) 25 MG tablet TAKE 1/2 TABLET(12.5 MG) BY MOUTH DAILY  . magnesium oxide (MAG-OX) 400 MG tablet Take 400 mg by mouth daily.  . Melatonin 5 MG TBDP Take 5 mg by mouth as needed.  . metoCLOPramide (REGLAN) 10 MG tablet Take 1 tablet (10 mg total) by mouth every 6 (six) hours as needed.  . nitroGLYCERIN (NITROSTAT) 0.3 MG SL tablet Place 1 tablet (0.3 mg total) under the tongue every 5 (five) minutes as needed for chest pain.  . Omega-3 Fatty Acids (FISH OIL) 1000 MG CAPS Take 1,200 mg by mouth daily.   Marland Kitchen omeprazole (PRILOSEC) 40 MG capsule 40 mg 2 (two) times a day.  . Polyethylene Glycol 3350 (MIRALAX PO) Take by mouth every evening.  . Probiotic Product (PROBIOTIC DAILY PO) Take by mouth daily.  . rosuvastatin (CRESTOR) 5 MG tablet  TAKE 1 TABLET(5 MG) BY MOUTH DAILY  . Simethicone 125 MG TABS Take by mouth as needed.      Allergies:   Alendronate sodium; Ibandronic acid; Simvastatin; and Amoxicillin-pot clavulanate   Social History   Tobacco Use  . Smoking status: Former Smoker    Years: 18.00    Types: Cigarettes    Last attempt to quit: 12/01/1999    Years since quitting: 19.1  . Smokeless tobacco: Never Used  Substance Use Topics  . Alcohol use: Yes  . Drug use: No     Current Outpatient Medications on File Prior to Visit  Medication Sig Dispense Refill  . aspirin 81 MG tablet Take 81 mg by mouth daily.    . Calcium-Magnesium-Vitamin D (CALCIUM 500 PO) Take 2,000 mg by mouth 2 (two) times daily.     . ciclopirox (PENLAC) 8 % solution APP 1 ML TOPICALLY TO NAILS HS    . Coenzyme Q10 (COQ10) 100 MG CAPS Take 100 mg  by mouth daily.    . Denosumab (PROLIA [place]) Inject 1 Units into the skin every 6 (six) months.    . diphenhydrAMINE (BENADRYL) 25 mg capsule Take 25 mg by mouth every 6 (six) hours as needed.     . docusate sodium (COLACE) 100 MG capsule Take 100 mg by mouth 2 (two) times daily.    Marland Kitchen ibuprofen (MOTRIN IB) 200 MG tablet Take 2 tablets (400 mg total) by mouth every 6 (six) hours as needed. 60 tablet 0  . losartan (COZAAR) 25 MG tablet TAKE 1/2 TABLET(12.5 MG) BY MOUTH DAILY 45 tablet 3  . magnesium oxide (MAG-OX) 400 MG tablet Take 400 mg by mouth daily.    . Melatonin 5 MG TBDP Take 5 mg by mouth as needed.    . metoCLOPramide (REGLAN) 10 MG tablet Take 1 tablet (10 mg total) by mouth every 6 (six) hours as needed. 30 tablet 0  . nitroGLYCERIN (NITROSTAT) 0.3 MG SL tablet Place 1 tablet (0.3 mg total) under the tongue every 5 (five) minutes as needed for chest pain. 25 tablet 3  . Omega-3 Fatty Acids (FISH OIL) 1000 MG CAPS Take 1,200 mg by mouth daily.     Marland Kitchen omeprazole (PRILOSEC) 40 MG capsule 40 mg 2 (two) times a day.    . Polyethylene Glycol 3350 (MIRALAX PO) Take by mouth every evening.    .  Probiotic Product (PROBIOTIC DAILY PO) Take by mouth daily.    . rosuvastatin (CRESTOR) 5 MG tablet TAKE 1 TABLET(5 MG) BY MOUTH DAILY 90 tablet 0  . Simethicone 125 MG TABS Take by mouth as needed.      No current facility-administered medications on file prior to visit.      Family Hx: The patient's family history includes Heart attack in her mother; Heart attack (age of onset: 60) in her father; Heart disease in her father and mother. There is no history of Breast cancer.  ROS:   Please see the history of present illness.    Review of Systems  Constitutional: Negative.   Respiratory: Negative.   Cardiovascular: Negative.   Gastrointestinal: Negative.   Musculoskeletal: Negative.   Neurological: Negative.   Psychiatric/Behavioral: Negative.   All other systems reviewed and are negative.    Labs/Other Tests and Data Reviewed:    Recent Labs: No results found for requested labs within last 8760 hours.   Recent Lipid Panel No results found for: CHOL, TRIG, HDL, CHOLHDL, LDLCALC, LDLDIRECT  Wt Readings from Last 3 Encounters:  01/13/19 111 lb (50.3 kg)  10/22/17 113 lb 12 oz (51.6 kg)  09/09/17 111 lb (50.3 kg)     Exam:    Vital Signs: Vital signs may also be detailed in the HPI BP (!) 118/56   Pulse 81   Ht 5\' 2"  (1.575 m)   Wt 111 lb (50.3 kg)   BMI 20.30 kg/m   Wt Readings from Last 3 Encounters:  01/13/19 111 lb (50.3 kg)  10/22/17 113 lb 12 oz (51.6 kg)  09/09/17 111 lb (50.3 kg)   Temp Readings from Last 3 Encounters:  09/09/17 98.9 F (37.2 C) (Oral)  07/01/17 (!) 96.5 F (35.8 C) (Tympanic)  07/13/16 97.6 F (36.4 C) (Oral)   BP Readings from Last 3 Encounters:  01/13/19 (!) 118/56  10/22/17 110/60  09/09/17 135/63   Pulse Readings from Last 3 Encounters:  01/13/19 81  10/22/17 61  09/09/17 76    110/60, pulse 60, resp 16  Well nourished,  well developed female in no acute distress. Constitutional:  oriented to person, place, and time. No  distress.  Head: Normocephalic and atraumatic.  Eyes:  no discharge. No scleral icterus.  Neck: Normal range of motion. Neck supple.  Pulmonary/Chest: No audible wheezing, no distress, appears comfortable Musculoskeletal: Normal range of motion.  no  tenderness or deformity.  Neurological:   Coordination normal. Full exam not performed Skin:  No rash Psychiatric:  normal mood and affect. behavior is normal. Thought content normal.    ASSESSMENT & PLAN:    Takotsubo syndrome Full recovery, good exercise tolerance, no medication changes made normal ejection fraction  Congestive dilated cardiomyopathy (HCC) Stable, no medication changes  Coronary artery disease involving native coronary artery of native heart without angina pectoris No known coronary disease, prior catheterization Probably can stop aspirin, discussed with her  Anxiety Doing well, no exacerbations  Mixed hyperlipidemia Cholesterol is at goal on the current lipid regimen. No changes to the medications were made.  Orthostatic hypotension Denies any orthostasis symptoms, blood pressure stable  Shortness of breath Exercising 7 days a week, denies any symptoms  COVID-19 Education: The signs and symptoms of COVID-19 were discussed with the patient and how to seek care for testing (follow up with PCP or arrange E-visit).  The importance of social distancing was discussed today.  Patient Risk:   After full review of this patients clinical status, I feel that they are at least moderate risk at this time.  Time:   Today, I have spent 25 minutes with the patient with telehealth technology discussing the cardiac and medical problems/diagnoses detailed above   10 min spent reviewing the chart prior to patient visit today   Medication Adjustments/Labs and Tests Ordered: Current medicines are reviewed at length with the patient today.  Concerns regarding medicines are outlined above.   Tests Ordered: No tests ordered    Medication Changes: No changes made   Disposition: Follow-up in 12 months   Signed, Ida Rogue, MD  01/13/2019 10:14 AM    [facility] [address] [place] [address], [place], [place] [address]

## 2019-01-13 ENCOUNTER — Other Ambulatory Visit: Payer: Self-pay

## 2019-01-13 ENCOUNTER — Telehealth (INDEPENDENT_AMBULATORY_CARE_PROVIDER_SITE_OTHER): Payer: Medicare Other | Admitting: Cardiovascular Disease

## 2019-01-13 VITALS — BP 118/56 | HR 81 | Ht 62.0 in | Wt 111.0 lb

## 2019-01-13 DIAGNOSIS — E782 Mixed hyperlipidemia: Secondary | ICD-10-CM

## 2019-01-13 DIAGNOSIS — I5181 Takotsubo syndrome: Secondary | ICD-10-CM | POA: Diagnosis not present

## 2019-01-13 DIAGNOSIS — I951 Orthostatic hypotension: Secondary | ICD-10-CM

## 2019-01-13 DIAGNOSIS — I2510 Atherosclerotic heart disease of native coronary artery without angina pectoris: Secondary | ICD-10-CM | POA: Diagnosis not present

## 2019-01-13 DIAGNOSIS — R0602 Shortness of breath: Secondary | ICD-10-CM

## 2019-01-13 DIAGNOSIS — I420 Dilated cardiomyopathy: Secondary | ICD-10-CM | POA: Diagnosis not present

## 2019-01-13 DIAGNOSIS — F419 Anxiety disorder, unspecified: Secondary | ICD-10-CM

## 2019-01-13 NOTE — Patient Instructions (Signed)

## 2019-02-06 ENCOUNTER — Other Ambulatory Visit: Payer: Self-pay | Admitting: Cardiovascular Disease

## 2019-02-12 ENCOUNTER — Other Ambulatory Visit: Payer: Self-pay | Admitting: Internal Medicine

## 2019-02-12 DIAGNOSIS — Z1231 Encounter for screening mammogram for malignant neoplasm of breast: Secondary | ICD-10-CM

## 2019-04-14 ENCOUNTER — Ambulatory Visit
Admission: RE | Admit: 2019-04-14 | Discharge: 2019-04-14 | Disposition: A | Discharge status: Home / Self Care | Payer: Medicare Other | Source: Ambulatory Visit | Attending: Internal Medicine | Admitting: Internal Medicine

## 2019-04-14 DIAGNOSIS — Z1231 Encounter for screening mammogram for malignant neoplasm of breast: Secondary | ICD-10-CM | POA: Diagnosis not present

## 2019-05-01 ENCOUNTER — Other Ambulatory Visit: Payer: Self-pay | Admitting: Cardiovascular Disease

## 2019-12-14 ENCOUNTER — Other Ambulatory Visit: Payer: Self-pay | Admitting: Dermatology

## 2020-02-04 ENCOUNTER — Other Ambulatory Visit: Payer: Self-pay

## 2020-02-04 MED ORDER — KETOCONAZOLE 2 % EX CREA: 1.0000 "application " | TOPICAL_CREAM | Freq: Every day | CUTANEOUS | 0 refills | Status: AC

## 2020-02-04 NOTE — Telephone Encounter (Signed)
Patient called asking for refill of Ketoconazole 2% Cream as she is almost out. She was last seen in 07/20 and schedule to get in at next available which is September.

## 2020-02-29 ENCOUNTER — Other Ambulatory Visit: Payer: Self-pay | Admitting: Internal Medicine

## 2020-02-29 DIAGNOSIS — Z1231 Encounter for screening mammogram for malignant neoplasm of breast: Secondary | ICD-10-CM

## 2020-04-14 ENCOUNTER — Other Ambulatory Visit: Payer: Self-pay

## 2020-04-14 ENCOUNTER — Ambulatory Visit
Admission: RE | Admit: 2020-04-14 | Discharge: 2020-04-14 | Disposition: A | Discharge status: Home / Self Care | Payer: Medicare Other | Source: Ambulatory Visit | Attending: Internal Medicine | Admitting: Internal Medicine

## 2020-04-14 DIAGNOSIS — Z1231 Encounter for screening mammogram for malignant neoplasm of breast: Secondary | ICD-10-CM | POA: Insufficient documentation

## 2020-04-21 ENCOUNTER — Ambulatory Visit (INDEPENDENT_AMBULATORY_CARE_PROVIDER_SITE_OTHER): Payer: Medicare Other | Admitting: Dermatology

## 2020-04-21 ENCOUNTER — Encounter: Payer: Self-pay | Admitting: Dermatology

## 2020-04-21 ENCOUNTER — Other Ambulatory Visit: Payer: Self-pay

## 2020-04-21 DIAGNOSIS — L578 Other skin changes due to chronic exposure to nonionizing radiation: Secondary | ICD-10-CM

## 2020-04-21 DIAGNOSIS — Z1283 Encounter for screening for malignant neoplasm of skin: Secondary | ICD-10-CM | POA: Diagnosis not present

## 2020-04-21 DIAGNOSIS — D229 Melanocytic nevi, unspecified: Secondary | ICD-10-CM | POA: Diagnosis not present

## 2020-04-21 DIAGNOSIS — L853 Xerosis cutis: Secondary | ICD-10-CM | POA: Diagnosis not present

## 2020-04-21 DIAGNOSIS — L821 Other seborrheic keratosis: Secondary | ICD-10-CM

## 2020-04-21 DIAGNOSIS — L820 Inflamed seborrheic keratosis: Secondary | ICD-10-CM | POA: Diagnosis not present

## 2020-04-21 DIAGNOSIS — Z85828 Personal history of other malignant neoplasm of skin: Secondary | ICD-10-CM | POA: Diagnosis not present

## 2020-04-21 DIAGNOSIS — D1800 Hemangioma unspecified site: Secondary | ICD-10-CM

## 2020-04-21 DIAGNOSIS — L814 Other melanin hyperpigmentation: Secondary | ICD-10-CM

## 2020-04-21 NOTE — Patient Instructions (Signed)

## 2020-04-21 NOTE — Progress Notes (Signed)
   Follow-Up Visit   Subjective  Debra Moody is a 79 y.o. female who presents for the following: Annual Exam (Hx BCC - TBSE today) and Rash (chest - itches and is worse when sweating). The patient presents for Total-Body Skin Exam (TBSE) for skin cancer screening and mole check.  The following portions of the chart were reviewed this encounter and updated as appropriate:  Tobacco  Allergies  Meds  Problems  Med Hx  Surg Hx  Fam Hx     Review of Systems:  No other skin or systemic complaints except as noted in HPI or Assessment and Plan.  Objective  Well appearing patient in no apparent distress; mood and affect are within normal limits.  A full examination was performed including scalp, head, eyes, ears, nose, lips, neck, chest, axillae, abdomen, back, buttocks, bilateral upper extremities, bilateral lower extremities, hands, feet, fingers, toes, fingernails, and toenails. All findings within normal limits unless otherwise noted below.  Objective  Abdomen x 2, back x 21 (23): Erythematous keratotic or waxy stuck-on papule or plaque.   Objective  Head - Anterior (Face): Xerosis   Assessment & Plan    History of Basal Cell Carcinoma of the Skin - No evidence of recurrence today - Recommend regular full body skin exams - Recommend daily broad spectrum sunscreen SPF 30+ to sun-exposed areas, reapply every 2 hours as needed.  - Call if any new or changing lesions are noted between office visits  Lentigines - Scattered tan macules - Discussed due to sun exposure - Benign, observe - Call for any changes  Seborrheic Keratoses - Stuck-on, waxy, tan-brown papules and plaques  - Discussed benign etiology and prognosis. - Observe - Call for any changes  Melanocytic Nevi - Tan-brown and/or pink-flesh-colored symmetric macules and papules - Benign appearing on exam today - Observation - Call clinic for new or changing moles - Recommend daily use of broad spectrum spf 30+  sunscreen to sun-exposed areas.   Hemangiomas - Red papules - Discussed benign nature - Observe - Call for any changes  Actinic Damage - diffuse scaly erythematous macules with underlying dyspigmentation - Recommend daily broad spectrum sunscreen SPF 30+ to sun-exposed areas, reapply every 2 hours as needed.  - Call for new or changing lesions. - Recommend Elta MD Zinc only sunscreen.  Skin cancer screening performed today.  Inflamed seborrheic keratosis (23) Abdomen x 2, back x 21  Destruction of lesion - Abdomen x 2, back x 21 Complexity: simple   Destruction method: cryotherapy   Informed consent: discussed and consent obtained   Timeout:  patient name, date of birth, surgical site, and procedure verified Lesion destroyed using liquid nitrogen: Yes   Region frozen until ice ball extended beyond lesion: Yes   Outcome: patient tolerated procedure well with no complications   Post-procedure details: wound care instructions given    Xerosis cutis Head - Anterior (Face)  Recommend Cerave or Vanicream moisturizer daily.    Skin cancer screening  Return in about 1 year (around 04/21/2021).  I, Ashok Cordia, CMA, am acting as scribe for Sarina Ser, MD .  Documentation: I have reviewed the above documentation for accuracy and completeness, and I agree with the above.  Sarina Ser, MD

## 2020-04-22 ENCOUNTER — Encounter: Payer: Self-pay | Admitting: Dermatology

## 2020-06-12 NOTE — Progress Notes (Signed)
Date:  06/12/2020   ID:  Debra Moody, DOB [birth year], MRN [id number]  Patient Location:  [place] [address]   Provider location:   [facility], [place] office  PCP:  Rusty Aus, MD  Cardiologist:  Arvid Right [facility]  Chief Complaint  Patient presents with  . other    12 month follow up. Meds reviewed by the pt.'s med list. Pt. c/o having to take NTG tablets at least 2-3 per year for chest discomfort.      History of Present Illness:    Debra Moody is a 79 y.o. female  past medical history of GERD, diverticulitis,  hysterectomy in the 80s, prior bladder surgery, elbow fracture October 2014, shingles September 2015,  previously seen for chest pain, elevated cardiac enzymes,  stress related cardiomyopathy. 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  Reports that she had been getting stress from current events, news, Has to turn the TV off and limit what she watches Rare chest pain, takes NTG Nothing consistent   exercising 7 days a week No SOB, walking on a regular basis Denies any near-syncope or syncope Tolerating Crestor and Zetia  Prior Dx with rosacea  Stress at home taking care of her husband who has cancer His cancer has been stable, neuroendocrine with metastases  Prior GERD symptoms  Lab work reviewed with her in detail Total chol 187, LDL 102 HBA1C 5.9  EKG personally reviewed by myself on todays visit Shows normal sinus rhythm rate 77 bpm no significant ST or T wave changes  Other past medical history reviewed Presented to the emergency room September 09, 2017 migraine Several hours later had central chest tightness nonradiating Chest pain resolved on its own  Previous Night sweats  periodic diverticulitis Hx of osteoarthritis  Was having falls on  coreg.  She stopped the medication, reports that falls have been better Previously was only taking carvedilol 3.125 mg daily   she was traveling out of state, was in [place] when she developed amnesia type symptoms. Her husband took her to the hospital. She had MRI and CT of the head that showed no stroke. Further workup showed abnormal EKG, abnormal echocardiogram, elevated troponin up to more than 3. Ischemia could not be excluded and she was taken to the cardiac catheterization lab 11/03/2014. 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 as there was LV dysfunction, ejection fraction estimated at 25% with anteroapical, apical and apical inferior wall akinesis  She was noted to have coronary spasm with a reversible 90% proximal RCA lesion, reversible 50% proximal left circumflex lesion She was discharged home on low-dose carvedilol and lisinopril   Prior CV studies:   The following studies were reviewed today:    Past Medical History:  Diagnosis Date  . Actinic keratosis 11/09/2008   L mid dorsum lat forearm - bx proven  . Basal cell carcinoma 11/11/2007   R top of shoulder  . Basal cell carcinoma 11/24/2013   L sup med scapula  . Basal cell carcinoma 12/22/2013   Mid back spinal   . Diverticulitis   . Dysplastic nevus    left forearm  . GERD (gastroesophageal reflux disease)   . History of shingles 2015  . Hyperlipidemia   . Hypertension   . MI (myocardial infarction) ([place])  11/03/2014   Severe damage to the left ventricle   . Osteoporosis    bone density test done 2019  . Skin cancer, basal cell    Past Surgical History:  Procedure Laterality Date  . ABDOMINAL HYSTERECTOMY    . CARDIAC CATHETERIZATION  11/03/2014  . COLONOSCOPY WITH PROPOFOL N/A 07/01/2017   Procedure: COLONOSCOPY WITH PROPOFOL;  Surgeon: Manya Silvas, MD;  Location: [facility] ENDOSCOPY;  Service: Endoscopy;  Laterality: N/A;  . GUM SURGERY       No  outpatient medications have been marked as taking for the 06/13/20 encounter (Appointment) with Minna Merritts, MD.     Allergies:   Alendronate sodium, Ibandronic acid, Simvastatin, and Amoxicillin-pot clavulanate   Social History   Tobacco Use  . Smoking status: Former Smoker    Years: 18.00    Types: Cigarettes    Quit date: 12/01/1999    Years since quitting: 20.5  . Smokeless tobacco: Never Used  Substance Use Topics  . Alcohol use: Yes  . Drug use: No     Current Outpatient Medications on File Prior to Visit  Medication Sig Dispense Refill  . aspirin 81 MG tablet Take 81 mg by mouth daily.    . Calcium-Magnesium-Vitamin D (CALCIUM 500 PO) Take 2,000 mg by mouth 2 (two) times daily.     . ciclopirox (PENLAC) 8 % solution APP 1 ML TOPICALLY TO NAILS HS    . Coenzyme Q10 (COQ10) 100 MG CAPS Take 100 mg by mouth daily.    . Denosumab (PROLIA Kerens) Inject 1 Units into the skin every 6 (six) months.    . diphenhydrAMINE (BENADRYL) 25 mg capsule Take 25 mg by mouth every 6 (six) hours as needed.     . docusate sodium (COLACE) 100 MG capsule Take 100 mg by mouth 2 (two) times daily.    Marland Kitchen ibuprofen (MOTRIN IB) 200 MG tablet Take 2 tablets (400 mg total) by mouth every 6 (six) hours as needed. 60 tablet 0  . losartan (COZAAR) 25 MG tablet TAKE 1/2 TABLET(12.5 MG) BY MOUTH DAILY 45 tablet 3  . magnesium oxide (MAG-OX) 400 MG tablet Take 400 mg by mouth daily.    . Melatonin 5 MG TBDP Take 5 mg by mouth as needed.    . metoCLOPramide (REGLAN) 10 MG tablet Take 1 tablet (10 mg total) by mouth every 6 (six) hours as needed. 30 tablet 0  . nitroGLYCERIN (NITROSTAT) 0.3 MG SL tablet Place 1 tablet (0.3 mg total) under the tongue every 5 (five) minutes as needed for chest pain. 25 tablet 3  . Omega-3 Fatty Acids (FISH OIL) 1000 MG CAPS Take 1,200 mg by mouth daily.     Marland Kitchen omeprazole (PRILOSEC) 40 MG capsule 40 mg 2 (two) times a day.    . Polyethylene Glycol 3350 (MIRALAX PO) Take by mouth  every evening.    . Probiotic Product (PROBIOTIC DAILY PO) Take by mouth daily.    . rosuvastatin (CRESTOR) 5 MG tablet TAKE 1 TABLET(5 MG) BY MOUTH DAILY 90 tablet 2  . Simethicone 125 MG TABS Take by mouth as needed.      No current facility-administered medications on file prior to visit.     Family Hx: The patient's family history includes Heart attack in her mother; Heart attack (age of onset: 51) in her father; Heart disease in her father and mother. There is no history of Breast cancer.  ROS:   Please see the history of present illness.  Review of Systems  Constitutional: Negative.   Respiratory: Negative.   Cardiovascular: Negative.   Gastrointestinal: Negative.   Musculoskeletal: Negative.   Neurological: Negative.   Psychiatric/Behavioral: Negative.   All other systems reviewed and are negative.    Labs/Other Tests and Data Reviewed:    Recent Labs: No results found for requested labs within last 8760 hours.   Recent Lipid Panel No results found for: CHOL, TRIG, HDL, CHOLHDL, LDLCALC, LDLDIRECT  Wt Readings from Last 3 Encounters:  01/13/19 111 lb (50.3 kg)  10/22/17 113 lb 12 oz (51.6 kg)  09/09/17 111 lb (50.3 kg)     Exam:     BP 120/62 (BP Location: Left Arm, Patient Position: Sitting, Cuff Size: Normal)   Pulse 77   Ht 5\' 3"  (1.6 m)   Wt 110 lb 6 oz (50.1 kg)   SpO2 96%   BMI 19.55 kg/m   Constitutional:  oriented to person, place, and time. No distress.  HENT:  Head: Grossly normal Eyes:  no discharge. No scleral icterus.  Neck: No JVD, no carotid bruits  Cardiovascular: Regular rate and rhythm, no murmurs appreciated Pulmonary/Chest: Clear to auscultation bilaterally, no wheezes or rails Abdominal: Soft.  no distension.  no tenderness.  Musculoskeletal: Normal range of motion Neurological:  normal muscle tone. Coordination normal. 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 no further workup at this time. Continue current medication regimen.  Anxiety Stopped watching the news Learned different techniques for stress reduction  Mixed hyperlipidemia Cholesterol is at goal on the current lipid regimen. No changes to the medications were made.  Orthostatic hypotension Denies any orthostasis symptoms, blood pressure stable  Shortness of breath Continue exercises Stable    Total encounter time more than 25 minutes  Greater than 50% was spent in counseling and coordination of care with the patient   Signed, Ida Rogue, MD  06/12/2020 1:53 PM    [place] [facility] [address] [place] [address], [place], [place] [address]

## 2020-06-13 ENCOUNTER — Ambulatory Visit (INDEPENDENT_AMBULATORY_CARE_PROVIDER_SITE_OTHER): Payer: Medicare Other | Admitting: Cardiovascular Disease

## 2020-06-13 ENCOUNTER — Other Ambulatory Visit: Payer: Self-pay

## 2020-06-13 ENCOUNTER — Encounter: Payer: Self-pay | Admitting: Cardiovascular Disease

## 2020-06-13 VITALS — BP 120/62 | HR 77 | Ht 63.0 in | Wt 110.4 lb

## 2020-06-13 DIAGNOSIS — I951 Orthostatic hypotension: Secondary | ICD-10-CM

## 2020-06-13 DIAGNOSIS — I420 Dilated cardiomyopathy: Secondary | ICD-10-CM

## 2020-06-13 DIAGNOSIS — E782 Mixed hyperlipidemia: Secondary | ICD-10-CM

## 2020-06-13 DIAGNOSIS — R0602 Shortness of breath: Secondary | ICD-10-CM

## 2020-06-13 DIAGNOSIS — I2510 Atherosclerotic heart disease of native coronary artery without angina pectoris: Secondary | ICD-10-CM

## 2020-06-13 MED ORDER — NITROGLYCERIN 0.3 MG SL SUBL: 0.3000 mg | SUBLINGUAL_TABLET | SUBLINGUAL | 3 refills | Status: DC | PRN

## 2020-06-13 NOTE — Patient Instructions (Signed)
Medication Instructions:  No changes  If you need a refill on your cardiac medications before your next appointment, please call your pharmacy.    Lab work: No new labs needed   If you have labs (blood work) drawn today and your tests are completely normal, you will receive your results only by: . MyChart Message (if you have MyChart) OR . A paper copy in the mail If you have any lab test that is abnormal or we need to change your treatment, we will call you to review the results.   Testing/Procedures: No new testing needed   Follow-Up: At [facility], you and your health needs are our priority.  As part of our continuing mission to provide you with exceptional heart care, we have created designated Provider Care Teams.  These Care Teams include your primary Cardiologist (physician) and Advanced Practice Providers (APPs -  Physician Assistants and Nurse Practitioners) who all work together to provide you with the care you need, when you need it.  . You will need a follow up appointment in 12 months  . Providers on your designated Care Team:   . Christopher Berge, NP . Ryan Dunn, PA-C . Jacquelyn Visser, PA-C  Any Other Special Instructions Will Be Listed Below (If Applicable).  COVID-19 Vaccine Information can be found at: [email or web address] For questions related to vaccine distribution or appointments, please email [email or web address] or call [phone].     

## 2021-03-14 ENCOUNTER — Other Ambulatory Visit: Payer: Self-pay | Admitting: Internal Medicine

## 2021-03-14 DIAGNOSIS — Z1231 Encounter for screening mammogram for malignant neoplasm of breast: Secondary | ICD-10-CM

## 2021-04-18 ENCOUNTER — Ambulatory Visit
Admission: RE | Admit: 2021-04-18 | Discharge: 2021-04-18 | Disposition: A | Discharge status: Home / Self Care | Payer: Medicare Other | Source: Ambulatory Visit | Attending: Internal Medicine | Admitting: Internal Medicine

## 2021-04-18 ENCOUNTER — Other Ambulatory Visit: Payer: Self-pay

## 2021-04-18 DIAGNOSIS — Z1231 Encounter for screening mammogram for malignant neoplasm of breast: Secondary | ICD-10-CM | POA: Diagnosis present

## 2021-04-21 ENCOUNTER — Ambulatory Visit
Admission: RE | Admit: 2021-04-21 | Discharge: 2021-04-21 | Disposition: A | Discharge status: Home / Self Care | Payer: Medicare Other | Source: Ambulatory Visit | Attending: Internal Medicine | Admitting: Internal Medicine

## 2021-04-21 ENCOUNTER — Other Ambulatory Visit: Payer: Self-pay

## 2021-04-21 ENCOUNTER — Other Ambulatory Visit: Payer: Self-pay | Admitting: Internal Medicine

## 2021-04-21 DIAGNOSIS — R928 Other abnormal and inconclusive findings on diagnostic imaging of breast: Secondary | ICD-10-CM

## 2021-04-26 ENCOUNTER — Encounter: Payer: Medicare Other | Admitting: Dermatology

## 2021-07-17 ENCOUNTER — Other Ambulatory Visit: Payer: Self-pay

## 2021-07-17 ENCOUNTER — Encounter: Payer: Self-pay | Admitting: Cardiovascular Disease

## 2021-07-17 ENCOUNTER — Ambulatory Visit (INDEPENDENT_AMBULATORY_CARE_PROVIDER_SITE_OTHER): Payer: Medicare Other | Admitting: Cardiovascular Disease

## 2021-07-17 VITALS — BP 140/62 | HR 66 | Ht 63.0 in | Wt 111.5 lb

## 2021-07-17 DIAGNOSIS — I951 Orthostatic hypotension: Secondary | ICD-10-CM

## 2021-07-17 DIAGNOSIS — E782 Mixed hyperlipidemia: Secondary | ICD-10-CM

## 2021-07-17 DIAGNOSIS — I420 Dilated cardiomyopathy: Secondary | ICD-10-CM | POA: Diagnosis not present

## 2021-07-17 DIAGNOSIS — R0602 Shortness of breath: Secondary | ICD-10-CM | POA: Diagnosis not present

## 2021-07-17 DIAGNOSIS — I5181 Takotsubo syndrome: Secondary | ICD-10-CM

## 2021-07-17 MED ORDER — NITROGLYCERIN 0.3 MG SL SUBL: 0.3000 mg | SUBLINGUAL_TABLET | SUBLINGUAL | 3 refills | Status: DC | PRN

## 2021-07-17 MED ORDER — EZETIMIBE 10 MG PO TABS: 10.0000 mg | ORAL_TABLET | Freq: Every day | ORAL | 3 refills | Status: DC

## 2021-07-17 MED ORDER — ROSUVASTATIN CALCIUM 5 MG PO TABS: ORAL_TABLET | ORAL | 3 refills | Status: DC

## 2021-07-17 MED ORDER — LOSARTAN POTASSIUM 25 MG PO TABS: ORAL_TABLET | ORAL | 3 refills | Status: DC

## 2021-07-17 NOTE — Patient Instructions (Addendum)
Medication Instructions:  Please restart  zetia 10 mg daily  If you need a refill on your cardiac medications before your next appointment, please call your pharmacy.   Lab work: No new labs needed  Testing/Procedures: No new testing needed  Follow-Up: At [facility], you and your health needs are our priority.  As part of our continuing mission to provide you with exceptional heart care, we have created designated Provider Care Teams.  These Care Teams include your primary Cardiologist (physician) and Advanced Practice Providers (APPs -  Physician Assistants and Nurse Practitioners) who all work together to provide you with the care you need, when you need it.  You will need a follow up appointment in 12 months  Providers on your designated Care Team:   Murray Hodgkins, NP Christell Faith, PA-C Cadence Kathlen Mody, [place]  COVID-19 Vaccine Information can be found at: [email or web address].uk For questions related to vaccine distribution or appointments, please email [email or web address] or call [phone].

## 2021-07-17 NOTE — Progress Notes (Signed)
Date:  07/17/2021   ID:  Debra Moody, DOB [birth year], MRN [id number]  Patient Location:  [place] [address]   Provider location:   [facility], [place] office  PCP:  Rusty Aus, MD  Cardiologist:  Arvid Right [facility]  Chief Complaint  Patient presents with   12 month follow up     Patient c/o shortness of breath with little exertion and several episodes of chest pain/indigestion. Medications reviewed by the patient verbally.      History of Present Illness:    Debra Moody is a 80 y.o. female  past medical history of GERD, diverticulitis,  hysterectomy in the 80s, prior bladder surgery, elbow fracture October 2014, shingles September 2015,  previously seen for chest pain, elevated cardiac enzymes,  stress related cardiomyopathy. 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   LOV 06/2020 In follow-up today reports having pain in her foot Trying to stay active  Reports she is tolerating Crestor 2.5 daily She stopped the Zetia On discussion of numbers total cholesterol increased  Denies chest pain concerning for angina In the past with rare chest pain, took nitro Not associated with exertion  Prior stress at home taking care of her husband who has cancer His cancer has been stable, neuroendocrine with metastases  Prior GERD symptoms  EKG personally reviewed by myself on todays visit Shows normal sinus rhythm rate 66 bpm no significant ST or T wave changes  Other past medical history reviewed Presented to the emergency room September 09, 2017 migraine Several hours later had central chest tightness nonradiating Chest pain resolved on its own  periodic diverticulitis Hx of osteoarthritis    she was traveling out of state, was in [place] when she developed amnesia type  symptoms. Her husband took her to the hospital. She had MRI and CT of the head that showed no stroke. Further workup showed abnormal EKG, abnormal echocardiogram, elevated troponin up to more than 3. Ischemia could not be excluded and she was taken to the cardiac catheterization lab 11/03/2014. 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 as there was LV dysfunction, ejection fraction estimated at 25% with anteroapical, apical and apical inferior wall akinesis   She was noted to have coronary spasm with a reversible 90% proximal RCA lesion, reversible 50% proximal left circumflex lesion She was discharged home on low-dose carvedilol and lisinopril    Past Medical History:  Diagnosis Date   Actinic keratosis 11/09/2008   L mid dorsum lat forearm - bx proven   Basal cell carcinoma 11/11/2007   R top of shoulder   Basal cell carcinoma 11/24/2013   L sup med scapula   Basal cell carcinoma 12/22/2013   Mid back spinal    Diverticulitis    Dysplastic nevus    left forearm   GERD (gastroesophageal reflux disease)    History of shingles 2015   Hyperlipidemia    Hypertension    MI (myocardial infarction) ([place]) 11/03/2014   Severe damage to the left ventricle    Osteoporosis    bone density test done 2019   Skin cancer, basal cell    Past Surgical History:  Procedure Laterality Date   ABDOMINAL HYSTERECTOMY     CARDIAC CATHETERIZATION  11/03/2014   COLONOSCOPY WITH PROPOFOL N/A 07/01/2017  Procedure: COLONOSCOPY WITH PROPOFOL;  Surgeon: Manya Silvas, MD;  Location: [facility] ENDOSCOPY;  Service: Endoscopy;  Laterality: N/A;   GUM SURGERY       Current Meds  Medication Sig   aspirin 81 MG tablet Take 81 mg by mouth daily.   Calcium-Magnesium-Vitamin D (CALCIUM 500 PO) Take 2,000 mg by mouth 2 (two) times daily.    Coenzyme Q10 (COQ10) 100 MG CAPS Take 100 mg by mouth daily.   Denosumab (PROLIA Diller) Inject 1 Units into the skin every 6 (six)  months.   diphenhydrAMINE (BENADRYL) 25 mg capsule Take 25 mg by mouth every 6 (six) hours as needed.    docusate sodium (COLACE) 100 MG capsule Take 100 mg by mouth 2 (two) times daily.   ibuprofen (ADVIL) 200 MG tablet Take 200 mg by mouth every 4 (four) hours as needed.    losartan (COZAAR) 25 MG tablet TAKE 1/2 TABLET(12.5 MG) BY MOUTH DAILY   Magnesium 400 MG CAPS Take by mouth daily.   Melatonin 5 MG TBDP Take 5 mg by mouth as needed.   nitroGLYCERIN (NITROSTAT) 0.3 MG SL tablet Place 1 tablet (0.3 mg total) under the tongue every 5 (five) minutes as needed for chest pain.   Omega-3 Fatty Acids (FISH OIL) 1000 MG CAPS Take 1,200 mg by mouth daily.    pantoprazole (PROTONIX) 40 MG tablet Take 40 mg by mouth daily.   Polyethylene Glycol 3350 (MIRALAX PO) Take by mouth every evening.   Probiotic Product (PROBIOTIC DAILY PO) Take by mouth daily.   rosuvastatin (CRESTOR) 5 MG tablet TAKE 1 TABLET(5 MG) BY MOUTH DAILY   Simethicone 125 MG TABS Take by mouth as needed.      Allergies:   Alendronate sodium, Ibandronic acid, Simvastatin, and Amoxicillin-pot clavulanate   Social History   Tobacco Use   Smoking status: Former    Years: 18.00    Types: Cigarettes    Quit date: 12/01/1999    Years since quitting: 21.6   Smokeless tobacco: Never  Vaping Use   Vaping Use: Never used  Substance Use Topics   Alcohol use: Yes   Drug use: No     Family Hx: The patient's family history includes Heart attack in her mother; Heart attack (age of onset: 59) in her father; Heart disease in her father and mother. There is no history of Breast cancer.  ROS:   Please see the history of present illness.    Review of Systems  Constitutional: Negative.   HENT: Negative.    Respiratory: Negative.    Cardiovascular: Negative.   Gastrointestinal: Negative.   Musculoskeletal: Negative.        Foot pain  Neurological: Negative.   Psychiatric/Behavioral: Negative.    All other systems reviewed and  are negative.   Labs/Other Tests and Data Reviewed:    Recent Labs: No results found for requested labs within last 8760 hours.   Recent Lipid Panel No results found for: CHOL, TRIG, HDL, CHOLHDL, LDLCALC, LDLDIRECT  Wt Readings from Last 3 Encounters:  07/17/21 111 lb 8 oz (50.6 kg)  06/13/20 110 lb 6 oz (50.1 kg)  01/13/19 111 lb (50.3 kg)     Exam:    BP 140/62 (BP Location: Left Arm, Patient Position: Sitting, Cuff Size: Normal)   Pulse 66   Ht 5\' 3"  (1.6 m)   Wt 111 lb 8 oz (50.6 kg)   SpO2 99%   BMI 19.75 kg/m   Constitutional:  oriented to person,  place, and time. No distress.  HENT:  Head: Grossly normal Eyes:  no discharge. No scleral icterus.  Neck: No JVD, no carotid bruits  Cardiovascular: Regular rate and rhythm, no murmurs appreciated Pulmonary/Chest: Clear to auscultation bilaterally, no wheezes or rails Abdominal: Soft.  no distension.  no tenderness.  Musculoskeletal: Normal range of motion Neurological:  normal muscle tone. Coordination normal. No atrophy Skin: Skin warm and dry Psychiatric: normal affect, pleasant  ASSESSMENT & PLAN:    Takotsubo syndrome no significant sx Has reduced her stress, regular exercise program normal ejection fraction on last 2 echos Appears stable on today's visit  Congestive dilated cardiomyopathy (HCC) Euvolemic, no medication changes stable  Coronary artery disease involving native coronary artery of native heart without angina pectoris Rare spasm relieved with nitro . no further workup at this time. Continue current medication regimen  Anxiety Stopped watching the news Learned different techniques for stress reduction  Mixed hyperlipidemia On crestor 2.5 daily We will add back Zetia 10 daily  Orthostatic hypotension Denies any orthostasis symptoms, blood pressure stable  Shortness of breath Continue exercises Stable    Total encounter time more than 25 minutes  Greater than 50% was spent in  counseling and coordination of care with the patient   Signed, Ida Rogue, MD  07/17/2021 2:54 PM    [place] [facility] [address], [place], [place] [address]

## 2021-07-18 ENCOUNTER — Ambulatory Visit (INDEPENDENT_AMBULATORY_CARE_PROVIDER_SITE_OTHER): Payer: Medicare Other | Admitting: Dermatology

## 2021-07-18 DIAGNOSIS — L209 Atopic dermatitis, unspecified: Secondary | ICD-10-CM

## 2021-07-18 DIAGNOSIS — L578 Other skin changes due to chronic exposure to nonionizing radiation: Secondary | ICD-10-CM

## 2021-07-18 DIAGNOSIS — L720 Epidermal cyst: Secondary | ICD-10-CM

## 2021-07-18 DIAGNOSIS — D1801 Hemangioma of skin and subcutaneous tissue: Secondary | ICD-10-CM | POA: Diagnosis not present

## 2021-07-18 DIAGNOSIS — Z872 Personal history of diseases of the skin and subcutaneous tissue: Secondary | ICD-10-CM

## 2021-07-18 DIAGNOSIS — L820 Inflamed seborrheic keratosis: Secondary | ICD-10-CM

## 2021-07-18 MED ORDER — MOMETASONE FUROATE 0.1 % EX CREA: TOPICAL_CREAM | CUTANEOUS | 0 refills | Status: AC

## 2021-07-18 NOTE — Patient Instructions (Signed)
   Pre-Operative Instructions  You are scheduled for a surgical procedure at [facility]. We recommend you read the following instructions. If you have any questions or concerns, please call the office at [phone].  Shower and wash the entire body with soap and water the day of your surgery paying special attention to cleansing at and around the planned surgery site.  Avoid aspirin or aspirin containing products at least fourteen (14) days prior to your surgical procedure and for at least one week (7 Days) after your surgical procedure. If you take aspirin on a regular basis for heart disease or history of stroke or for any other reason, we may recommend you continue taking aspirin but please notify us if you take this on a regular basis. Aspirin can cause more bleeding to occur during surgery as well as prolonged bleeding and bruising after surgery.   Avoid other nonsteroidal pain medications at least one week prior to surgery and at least one week prior to your surgery. These include medications such as Ibuprofen (Motrin, Advil and Nuprin), Naprosyn, Voltaren, Relafen, etc. If medications are used for therapeutic reasons, please inform us as they can cause increased bleeding or prolonged bleeding during and bruising after surgical procedures.   Please advise us if you are taking any "blood thinner" medications such as Coumadin or Dipyridamole or Plavix or similar medications. These cause increased bleeding and prolonged bleeding during procedures and bruising after surgical procedures. We may have to consider discontinuing these medications briefly prior to and shortly after your surgery if safe to do so.   Please inform us of all medications you are currently taking. All medications that are taken regularly should be taken the day of surgery as you always do. Nevertheless, we need to be informed of what medications you are taking prior to surgery to know whether they will affect the  procedure or cause any complications.   Please inform us of any medication allergies. Also inform us of whether you have allergies to Latex or rubber products or whether you have had any adverse reaction to Lidocaine or Epinephrine.  Please inform us of any prosthetic or artificial body parts such as artificial heart valve, joint replacements, etc., or similar condition that might require preoperative antibiotics.   We recommend avoidance of alcohol at least two weeks prior to surgery and continued avoidance for at least two weeks after surgery.   We recommend discontinuation of tobacco smoking at least two weeks prior to surgery and continued abstinence for at least two weeks after surgery.  Do not plan strenuous exercise, strenuous work or strenuous lifting for approximately four weeks after your surgery.   We request if you are unable to make your scheduled surgical appointment, please call us at least a week in advance or as soon as you are aware of a problem so that we can cancel or reschedule the appointment.   You MAY TAKE TYLENOL (acetaminophen) for pain as it is not a blood thinner.   PLEASE PLAN TO BE IN TOWN FOR TWO WEEKS FOLLOWING SURGERY, THIS IS IMPORTANT SO YOU CAN BE CHECKED FOR DRESSING CHANGES, SUTURE REMOVAL AND TO MONITOR FOR POSSIBLE COMPLICATIONS.  

## 2021-07-18 NOTE — Progress Notes (Signed)
Follow-Up Visit   Subjective  Debra Moody is a 80 y.o. female who presents for the following: Irregular skin lesions (On the L neck/shoulder - itchy, hx of infection, very tender to the touch. ). She would also like to discuss a stronger medication than Ketoconazole for rash on her chest because she is still having issues with itchy rash especially in the summer months. Lastly, she has a dark skin lesion on the breast that turns a purple color that she would like checked today. The patient has spots, moles and lesions to be evaluated, some may be new or changing.  The following portions of the chart were reviewed this encounter and updated as appropriate:   Tobacco  Allergies  Meds  Problems  Med Hx  Surg Hx  Fam Hx     Review of Systems:  No other skin or systemic complaints except as noted in HPI or Assessment and Plan.  Objective  Well appearing patient in no apparent distress; mood and affect are within normal limits.  A focused examination was performed including the neck, back, and breast. Relevant physical exam findings are noted in the Assessment and Plan.  L post neck Firm SQ nodule. Hx of rupture and drainage.   Chest Mild erythema.   Chest x 18 (18) Erythematous keratotic or waxy stuck-on papule or plaque.    Assessment & Plan  Epidermal inclusion cyst L post neck Benign-appearing. Exam most consistent with an epidermal inclusion cyst. Discussed that a cyst is a benign growth that can grow over time and sometimes get irritated or inflamed. Recommend observation if it is not bothersome. Discussed option of surgical excision to remove it if it is growing, symptomatic, or other changes noted. Please call for new or changing lesions so they can be evaluated.  Atopic dermatitis,  Chest Atopic dermatitis (eczema) is a chronic, relapsing, pruritic condition that can significantly affect quality of life. It is often associated with allergic rhinitis and/or asthma and can  require treatment with topical medications, phototherapy, or in severe cases biologic injectable medication (Dupixent; Adbry) or Oral JAK inhibitors.  Start Mometasone 0.1% cream to aa rash QD PRN. Topical steroids (such as triamcinolone, fluocinolone, fluocinonide, mometasone, clobetasol, halobetasol, betamethasone, hydrocortisone) can cause thinning and lightening of the skin if they are used for too long in the same area. Your physician has selected the right strength medicine for your problem and area affected on the body. Please use your medication only as directed by your physician to prevent side effects.   mometasone (ELOCON) 0.1 % cream - Chest Apply to rash on chest QD-BID PRN flares.  Inflamed seborrheic keratosis Chest x 18 Destruction of lesion - Chest x 18 Complexity: simple   Destruction method: cryotherapy   Informed consent: discussed and consent obtained   Timeout:  patient name, date of birth, surgical site, and procedure verified Lesion destroyed using liquid nitrogen: Yes   Region frozen until ice ball extended beyond lesion: Yes   Outcome: patient tolerated procedure well with no complications   Post-procedure details: wound care instructions given    Hemangiomas - discussed treatment options if bothersome - Red papules - Discussed benign nature - Observe - Call for any changes  Actinic Damage - chronic, secondary to cumulative UV radiation exposure/sun exposure over time - diffuse scaly erythematous macules with underlying dyspigmentation - Recommend daily broad spectrum sunscreen SPF 30+ to sun-exposed areas, reapply every 2 hours as needed.  - Recommend staying in the shade or wearing  long sleeves, sun glasses (UVA+UVB protection) and wide brim hats (4-inch brim around the entire circumference of the hat). - Call for new or changing lesions.  Return for appointment as scheduled; surgery for cyst excision.  Luther Redo, CMA, am acting as scribe for Sarina Ser, MD . Documentation: I have reviewed the above documentation for accuracy and completeness, and I agree with the above.  Sarina Ser, MD

## 2021-07-28 ENCOUNTER — Encounter: Payer: Self-pay | Admitting: Dermatology

## 2021-08-16 ENCOUNTER — Encounter: Payer: Self-pay | Admitting: Cardiovascular Disease

## 2021-08-24 ENCOUNTER — Ambulatory Visit (INDEPENDENT_AMBULATORY_CARE_PROVIDER_SITE_OTHER): Payer: Medicare Other | Admitting: Dermatology

## 2021-08-24 ENCOUNTER — Other Ambulatory Visit: Payer: Self-pay

## 2021-08-24 DIAGNOSIS — L821 Other seborrheic keratosis: Secondary | ICD-10-CM

## 2021-08-24 DIAGNOSIS — Z86018 Personal history of other benign neoplasm: Secondary | ICD-10-CM

## 2021-08-24 DIAGNOSIS — Z85828 Personal history of other malignant neoplasm of skin: Secondary | ICD-10-CM

## 2021-08-24 DIAGNOSIS — D1800 Hemangioma unspecified site: Secondary | ICD-10-CM

## 2021-08-24 DIAGNOSIS — Z1283 Encounter for screening for malignant neoplasm of skin: Secondary | ICD-10-CM

## 2021-08-24 DIAGNOSIS — D229 Melanocytic nevi, unspecified: Secondary | ICD-10-CM

## 2021-08-24 DIAGNOSIS — L578 Other skin changes due to chronic exposure to nonionizing radiation: Secondary | ICD-10-CM | POA: Diagnosis not present

## 2021-08-24 DIAGNOSIS — L814 Other melanin hyperpigmentation: Secondary | ICD-10-CM

## 2021-08-24 NOTE — Patient Instructions (Signed)

## 2021-08-24 NOTE — Progress Notes (Signed)
° °  Follow-Up Visit   Subjective  Debra Moody is a 81 y.o. female who presents for the following: Annual Exam (History of BCC, Dysplastic nevus - TBSE today). The patient presents for Total-Body Skin Exam (TBSE) for skin cancer screening and mole check.  The patient has spots, moles and lesions to be evaluated, some may be new or changing and the patient has concerns that these could be cancer.  The following portions of the chart were reviewed this encounter and updated as appropriate:   Tobacco   Allergies   Meds   Problems   Med Hx   Surg Hx   Fam Hx      Review of Systems:  No other skin or systemic complaints except as noted in HPI or Assessment and Plan.  Objective  Well appearing patient in no apparent distress; mood and affect are within normal limits.  A full examination was performed including scalp, head, eyes, ears, nose, lips, neck, chest, axillae, abdomen, back, buttocks, bilateral upper extremities, bilateral lower extremities, hands, feet, fingers, toes, fingernails, and toenails. All findings within normal limits unless otherwise noted below.  Left Forearm Well healed biopsy site   Assessment & Plan   History of Basal Cell Carcinoma of the Skin - No evidence of recurrence today - Recommend regular full body skin exams - Recommend daily broad spectrum sunscreen SPF 30+ to sun-exposed areas, reapply every 2 hours as needed.  - Call if any new or changing lesions are noted between office visits  Lentigines - Scattered tan macules - Due to sun exposure - Benign-appearing, observe - Recommend daily broad spectrum sunscreen SPF 30+ to sun-exposed areas, reapply every 2 hours as needed. - Call for any changes  Seborrheic Keratoses - Stuck-on, waxy, tan-brown papules and/or plaques  - Benign-appearing - Discussed benign etiology and prognosis. - Observe - Call for any changes  Melanocytic Nevi - Tan-brown and/or pink-flesh-colored symmetric macules and papules -  Benign appearing on exam today - Observation - Call clinic for new or changing moles - Recommend daily use of broad spectrum spf 30+ sunscreen to sun-exposed areas.   Hemangiomas - Red papules - Discussed benign nature - Observe - Call for any changes  Actinic Damage - Chronic condition, secondary to cumulative UV/sun exposure - diffuse scaly erythematous macules with underlying dyspigmentation - Recommend daily broad spectrum sunscreen SPF 30+ to sun-exposed areas, reapply every 2 hours as needed.  - Staying in the shade or wearing long sleeves, sun glasses (UVA+UVB protection) and wide brim hats (4-inch brim around the entire circumference of the hat) are also recommended for sun protection.  - Call for new or changing lesions.  Skin cancer screening performed today.  History of dysplastic nevus Left Forearm  Clear. Observe for recurrence. Call clinic for new or changing lesions.  Recommend regular skin exams, daily broad-spectrum spf 30+ sunscreen use, and photoprotection.     Skin cancer screening   Return in about 1 year (around 08/24/2022) for TBSE.  I, Ashok Cordia, CMA, am acting as scribe for Sarina Ser, MD . Documentation: I have reviewed the above documentation for accuracy and completeness, and I agree with the above.  Sarina Ser, MD

## 2021-08-28 ENCOUNTER — Encounter: Payer: Self-pay | Admitting: Dermatology

## 2021-09-05 ENCOUNTER — Encounter: Payer: Medicare Other | Admitting: Dermatology

## 2021-09-07 ENCOUNTER — Encounter: Payer: Self-pay | Admitting: Ophthalmology

## 2021-09-18 NOTE — Discharge Instructions (Signed)

## 2021-09-20 ENCOUNTER — Encounter
Admission: RE | Disposition: A | Discharge status: Home / Self Care | Payer: Self-pay | Source: Home / Self Care | Attending: Ophthalmology

## 2021-09-20 ENCOUNTER — Other Ambulatory Visit: Payer: Self-pay

## 2021-09-20 ENCOUNTER — Ambulatory Visit: Payer: Medicare Other | Admitting: Anesthesiology

## 2021-09-20 ENCOUNTER — Ambulatory Visit
Admission: RE | Admit: 2021-09-20 | Discharge: 2021-09-20 | Disposition: A | Discharge status: Home / Self Care | Payer: Medicare Other | Attending: Ophthalmology | Admitting: Ophthalmology

## 2021-09-20 ENCOUNTER — Encounter: Payer: Self-pay | Admitting: Ophthalmology

## 2021-09-20 DIAGNOSIS — Z87891 Personal history of nicotine dependence: Secondary | ICD-10-CM | POA: Diagnosis not present

## 2021-09-20 DIAGNOSIS — H2512 Age-related nuclear cataract, left eye: Secondary | ICD-10-CM | POA: Insufficient documentation

## 2021-09-20 DIAGNOSIS — K219 Gastro-esophageal reflux disease without esophagitis: Secondary | ICD-10-CM | POA: Diagnosis not present

## 2021-09-20 DIAGNOSIS — I10 Essential (primary) hypertension: Secondary | ICD-10-CM | POA: Insufficient documentation

## 2021-09-20 DIAGNOSIS — Z79899 Other long term (current) drug therapy: Secondary | ICD-10-CM | POA: Insufficient documentation

## 2021-09-20 HISTORY — DX: Family history of other specified conditions: Z84.89

## 2021-09-20 HISTORY — PX: CATARACT EXTRACTION W/PHACO: SHX586

## 2021-09-20 HISTORY — DX: Orthostatic hypotension: I95.1

## 2021-09-20 HISTORY — DX: Other complications of anesthesia, initial encounter: T88.59XA

## 2021-09-20 SURGERY — PHACOEMULSIFICATION, CATARACT, WITH IOL INSERTION
Anesthesia: General | Site: Eye | Laterality: Left

## 2021-09-20 MED ORDER — SIGHTPATH DOSE#1 BSS IO SOLN
INTRAOCULAR | Status: DC | PRN
  Administered 2021-09-20: 1 mL via INTRAMUSCULAR

## 2021-09-20 MED ORDER — FENTANYL CITRATE (PF) 100 MCG/2ML IJ SOLN
INTRAMUSCULAR | Status: DC | PRN
  Administered 2021-09-20: 25 ug via INTRAVENOUS
  Administered 2021-09-20: 50 ug via INTRAVENOUS

## 2021-09-20 MED ORDER — TETRACAINE HCL 0.5 % OP SOLN
1.0000 [drp] | OPHTHALMIC | Status: DC | PRN
  Administered 2021-09-20 (×3): 1 [drp] via OPHTHALMIC

## 2021-09-20 MED ORDER — SIGHTPATH DOSE#1 BSS IO SOLN
INTRAOCULAR | Status: DC | PRN
  Administered 2021-09-20: 63 mL via OPHTHALMIC

## 2021-09-20 MED ORDER — BRIMONIDINE TARTRATE-TIMOLOL 0.2-0.5 % OP SOLN
OPHTHALMIC | Status: DC | PRN
  Administered 2021-09-20: 1 [drp] via OPHTHALMIC

## 2021-09-20 MED ORDER — ACETAMINOPHEN 325 MG PO TABS: 325.0000 mg | ORAL_TABLET | ORAL | Status: DC | PRN

## 2021-09-20 MED ORDER — SIGHTPATH DOSE#1 NA HYALUR & NA CHOND-NA HYALUR IO KIT
PACK | INTRAOCULAR | Status: DC | PRN
  Administered 2021-09-20: 1 via OPHTHALMIC

## 2021-09-20 MED ORDER — MIDAZOLAM HCL 2 MG/2ML IJ SOLN
INTRAMUSCULAR | Status: DC | PRN
  Administered 2021-09-20: 1 mg via INTRAVENOUS
  Administered 2021-09-20: .5 mg via INTRAVENOUS

## 2021-09-20 MED ORDER — LACTATED RINGERS IV SOLN: INTRAVENOUS | Status: DC

## 2021-09-20 MED ORDER — CEFUROXIME OPHTHALMIC INJECTION 1 MG/0.1 ML
INJECTION | OPHTHALMIC | Status: DC | PRN
  Administered 2021-09-20: 0.1 mL via INTRACAMERAL

## 2021-09-20 MED ORDER — ACETAMINOPHEN 160 MG/5ML PO SOLN: 325.0000 mg | ORAL | Status: DC | PRN

## 2021-09-20 MED ORDER — SIGHTPATH DOSE#1 BSS IO SOLN
INTRAOCULAR | Status: DC | PRN
  Administered 2021-09-20: 15 mL

## 2021-09-20 MED ORDER — ARMC OPHTHALMIC DILATING DROPS
1.0000 "application " | OPHTHALMIC | Status: DC | PRN
  Administered 2021-09-20 (×3): 1 via OPHTHALMIC

## 2021-09-20 SURGICAL SUPPLY — 11 items
CATARACT SUITE SIGHTPATH (MISCELLANEOUS) ×2 IMPLANT
FEE CATARACT SUITE SIGHTPATH (MISCELLANEOUS) ×1 IMPLANT
GLOVE SRG 8 PF TXTR STRL LF DI (GLOVE) ×1 IMPLANT
GLOVE SURG ENC TEXT LTX SZ7.5 (GLOVE) ×2 IMPLANT
GLOVE SURG UNDER POLY LF SZ8 (GLOVE) ×2
LENS IOL ACRSF IQ ULTRA 18.5 (Intraocular Lens) IMPLANT
LENS IOL ACRYSOF IQ 18.5 (Intraocular Lens) ×2 IMPLANT
NDL FILTER BLUNT 18X1 1/2 (NEEDLE) ×1 IMPLANT
NEEDLE FILTER BLUNT 18X 1/2SAF (NEEDLE) ×1
NEEDLE FILTER BLUNT 18X1 1/2 (NEEDLE) ×1 IMPLANT
SYR 3ML LL SCALE MARK (SYRINGE) ×2 IMPLANT

## 2021-09-20 NOTE — Transfer of Care (Signed)
Immediate Anesthesia Transfer of Care Note  Patient: Debra Moody  Procedure(s) Performed: CATARACT EXTRACTION PHACO AND INTRAOCULAR LENS PLACEMENT (IOC) LEFT 5.68 00:56.7 (Left: Eye)  Patient Location: PACU  Anesthesia Type: General  Level of Consciousness: awake, alert  and patient cooperative  Airway and Oxygen Therapy: Patient Spontanous Breathing and Patient connected to supplemental oxygen  Post-op Assessment: Post-op Vital signs reviewed, Patient's Cardiovascular Status Stable, Respiratory Function Stable, Patent Airway and No signs of Nausea or vomiting  Post-op Vital Signs: Reviewed and stable  Complications: No notable events documented.

## 2021-09-20 NOTE — H&P (Signed)
Derma   Primary Care Physician:  Rusty Aus, MD Ophthalmologist: Dr. Leandrew Koyanagi  Pre-Procedure History & Physical: HPI:  Debra Moody is a 81 y.o. female here for ophthalmic surgery.   Past Medical History:  Diagnosis Date   Actinic keratosis 11/09/2008   L mid dorsum lat forearm - bx proven   Basal cell carcinoma 11/11/2007   R top of shoulder   Basal cell carcinoma 11/24/2013   L sup med scapula   Basal cell carcinoma 12/22/2013   Mid back spinal    Complication of anesthesia    Gas at dentist office "made me feel like I was going to die".   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 Authorizing Provider  aspirin 81 MG tablet Take 81 mg by mouth daily.   Yes [provider]  Calcium-Magnesium-Vitamin D (CALCIUM 500 PO) Take 2,000 mg by mouth 2 (two) times daily.    Yes [provider]  Cholecalciferol (VITAMIN D) 125 MCG (5000 UT) CAPS Take by mouth.   Yes [provider]  Coenzyme Q10 (COQ10) 100 MG CAPS Take 100 mg by mouth daily.   Yes [provider]  Denosumab (PROLIA [place]) Inject 1 Units into the skin every 6 (six) months.   Yes [provider]  diphenhydrAMINE (BENADRYL) 25 mg capsule Take 25 mg by mouth every 6 (six) hours as needed.    Yes [provider]  DM-Doxylamine-Acetaminophen 15-6.25-325 MG/15ML LIQD Take by mouth.   Yes [provider]  docusate sodium (COLACE) 100 MG capsule Take 100 mg by mouth 2 (two) times daily.   Yes [provider]  ezetimibe (ZETIA) 10 MG tablet Take 1 tablet (10 mg total) by mouth daily. 07/17/21  Yes Minna Merritts, MD  ibuprofen (ADVIL) 200 MG tablet Take 200 mg by mouth every 4 (four) hours as needed.    Yes [provider]  losartan (COZAAR) 25 MG tablet TAKE 1/2 TABLET(12.5 MG) BY MOUTH DAILY 07/17/21  Yes Gollan, Kathlene November, MD  Magnesium 400 MG CAPS Take by mouth daily.   Yes [provider]  Melatonin 5 MG TBDP Take 5 mg by mouth as needed.   Yes [provider]  metroNIDAZOLE (METROGEL) 0.75 % gel Apply 1 application topically 2 (two) times daily as needed.   Yes [provider]  mometasone (ELOCON) 0.1 % cream Apply to rash on chest QD-BID PRN flares. 07/18/21  Yes Ralene Bathe, MD  nitroGLYCERIN (NITROSTAT) 0.3 MG SL tablet Place 1 tablet (0.3 mg total) under the tongue every 5 (five) minutes as needed  for chest pain. 07/17/21  Yes Gollan, Kathlene November, MD  Omega-3 Fatty Acids (FISH OIL) 1000 MG CAPS Take 1,200 mg by mouth daily.    Yes [provider]  pantoprazole (PROTONIX) 40 MG tablet Take 40 mg by mouth daily. 03/14/21 03/14/22 Yes [provider]  Polyethylene Glycol 3350 (MIRALAX PO) Take by mouth every evening.   Yes [provider]  Probiotic Product (PROBIOTIC DAILY PO) Take by mouth daily.   Yes [provider]  Propylene Glycol (SYSTANE COMPLETE OP) Apply to eye as needed.   Yes [provider]  rosuvastatin (CRESTOR) 5 MG tablet TAKE 1 TABLET(5 MG) BY MOUTH DAILY 07/17/21  Yes Gollan, Kathlene November, MD  Simethicone 125 MG TABS Take by mouth as needed.    Yes [provider]  TURMERIC PO Take by mouth. Turmeric 700/Apple cider Vinegar 500   Yes [provider]  vitamin B-12 (CYANOCOBALAMIN) 1000 MCG tablet Take 1,000 mcg by mouth daily.   Yes [provider]  zinc gluconate 50 MG tablet Take 50 mg by mouth daily.   Yes [provider]  famotidine (PEPCID) 10 MG tablet Take 10 mg by mouth daily.  Patient not taking: Reported on 07/17/2021 06/14/19   [provider]    Allergies as of 08/22/2021 - Review Complete 07/28/2021  Allergen Reaction Noted   Alendronate sodium Other (See Comments) 12/01/2014   Ibandronic acid Other (See Comments) 12/01/2014   Simvastatin Other (See Comments) 12/01/2014   Amoxicillin-pot clavulanate Rash 12/01/2014    Family History  Problem Relation Age of Onset   Heart disease Mother        stent placement   Heart attack Mother    Heart disease Father        CABG    Heart attack Father 32   Breast cancer Neg Hx     Social History   Socioeconomic History   Marital status: Married    Spouse name: Not on file   Number of children: Not on file   Years of education: Not on file   Highest education level: Not on file  Occupational History   Not on file  Tobacco Use   Smoking status: Former    Years: 18.00    Types: Cigarettes    Quit date: 12/01/1999    Years since quitting: 21.8   Smokeless tobacco: Never  Vaping Use   Vaping Use: Never used  Substance and Sexual Activity   Alcohol use: Yes    Comment: rare   Drug use: No   Sexual activity: Not on file  Other Topics Concern   Not on file  Social History Narrative   Not on file   Social Determinants of Health   Financial Resource Strain: Not on file  Food Insecurity: Not on file  Transportation Needs: Not on file  Physical Activity: Not on file  Stress: Not on file  Social Connections: Not on file  Intimate Partner Violence: Not on file    Review of Systems: See HPI, otherwise negative ROS  Physical  Exam: BP (!) 144/59    Pulse 67    Temp (!) 97 F (36.1 C)    Ht 5\' 3"  (1.6 m)    Wt 49.9 kg    SpO2 100%    BMI 19.49 kg/m  General:   Alert,  pleasant and cooperative in NAD Head:  Normocephalic and atraumatic. Lungs:  Clear to auscultation.    Heart:  Regular rate and rhythm.  Impression/Plan: [name] is here for ophthalmic surgery.  Risks, benefits, limitations, and alternatives regarding ophthalmic surgery have been reviewed with the patient.  Questions have been answered.  All parties agreeable.   Leandrew Koyanagi, MD  09/20/2021, 11:30 AM

## 2021-09-20 NOTE — Anesthesia Postprocedure Evaluation (Signed)
Anesthesia Post Note  Patient: Debra Moody  Procedure(s) Performed: CATARACT EXTRACTION PHACO AND INTRAOCULAR LENS PLACEMENT (IOC) LEFT 5.68 00:56.7 (Left: Eye)     Patient location during evaluation: PACU Anesthesia Type: General Level of consciousness: awake and alert Pain management: pain level controlled Vital Signs Assessment: post-procedure vital signs reviewed and stable Respiratory status: spontaneous breathing, nonlabored ventilation, respiratory function stable and patient connected to nasal cannula oxygen Cardiovascular status: stable and blood pressure returned to baseline Postop Assessment: no apparent nausea or vomiting Anesthetic complications: no   No notable events documented.  Trecia Rogers

## 2021-09-20 NOTE — Op Note (Signed)
OPERATIVE NOTE  Debra Moody [id number] 09/20/2021   PREOPERATIVE DIAGNOSIS:  Nuclear sclerotic cataract left eye. H25.12   POSTOPERATIVE DIAGNOSIS:    Nuclear sclerotic cataract left eye.     PROCEDURE:  Phacoemusification with posterior chamber intraocular lens placement of the left eye  Ultrasound time: Procedure(s): CATARACT EXTRACTION PHACO AND INTRAOCULAR LENS PLACEMENT (IOC) LEFT 5.68 00:56.7 (Left)  LENS:   Implant Name Type Inv. Item Serial No. Manufacturer Lot No. LRB No. Used Action  LENS IOL ACRYSOF IQ 18.5 - [id number] Intraocular Lens LENS IOL ACRYSOF IQ 18.5 [id number] [employer]  Left 1 Implanted      SURGEON:  Wyonia Hough, MD   ANESTHESIA:  Topical with tetracaine drops and 2% Xylocaine jelly, augmented with 1% preservative-free intracameral lidocaine.    COMPLICATIONS:  None.   DESCRIPTION OF PROCEDURE:  The patient was identified in the holding room and transported to the operating room and placed in the supine position under the operating microscope.  The left eye was identified as the operative eye and it was prepped and draped in the usual sterile ophthalmic fashion.   A 1 millimeter clear-corneal paracentesis was made at the 1:30 position.  0.5 ml of preservative-free 1% lidocaine was injected into the anterior chamber.  The anterior chamber was filled with Viscoat viscoelastic.  A 2.4 millimeter keratome was used to make a near-clear corneal incision at the 10:30 position.  .  A curvilinear capsulorrhexis was made with a cystotome and capsulorrhexis forceps.  Balanced salt solution was used to hydrodissect and hydrodelineate the nucleus.   Phacoemulsification was then used in stop and chop fashion to remove the lens nucleus and epinucleus.  The remaining cortex was then removed using the irrigation and aspiration handpiece. Provisc was then placed into the capsular bag to distend it for lens placement.  A lens was then injected into the capsular bag.   The remaining viscoelastic was aspirated.   Wounds were hydrated with balanced salt solution.  The anterior chamber was inflated to a physiologic pressure with balanced salt solution.  No wound leaks were noted. Cefuroxime 0.1 ml of a 10mg /ml solution was injected into the anterior chamber for a dose of 1 mg of intracameral antibiotic at the completion of the case.   Timolol and Brimonidine drops were applied to the eye.  The patient was taken to the recovery room in stable condition without complications of anesthesia or surgery.  [name] 09/20/2021, 12:13 PM

## 2021-09-20 NOTE — Anesthesia Preprocedure Evaluation (Signed)
Anesthesia Evaluation  Patient identified by MRN, date of birth, ID band Patient awake    Reviewed: Allergy & Precautions, H&P , NPO status , Patient's Chart, lab work & pertinent test results, reviewed documented beta blocker date and time   Airway Mallampati: II  TM Distance: >3 FB Neck ROM: full    Dental no notable dental hx.    Pulmonary shortness of breath, former smoker,    Pulmonary exam normal breath sounds clear to auscultation       Cardiovascular Exercise Tolerance: Good hypertension, Normal cardiovascular exam Rhythm:regular Rate:Normal     Neuro/Psych negative neurological ROS  negative psych ROS   GI/Hepatic Neg liver ROS, GERD  Medicated and Controlled,  Endo/Other  negative endocrine ROS  Renal/GU negative Renal ROS  negative genitourinary   Musculoskeletal   Abdominal   Peds  Hematology negative hematology ROS (+)   Anesthesia Other Findings   Reproductive/Obstetrics negative OB ROS                             Anesthesia Physical Anesthesia Plan  ASA: 2  Anesthesia Plan: General   Post-op Pain Management:    Induction:   PONV Risk Score and Plan:   Airway Management Planned:   Additional Equipment:   Intra-op Plan:   Post-operative Plan:   Informed Consent: I have reviewed the patients History and Physical, chart, labs and discussed the procedure including the risks, benefits and alternatives for the proposed anesthesia with the patient or authorized representative who has indicated his/her understanding and acceptance.     Dental Advisory Given  Plan Discussed with: CRNA  Anesthesia Plan Comments:         Anesthesia Quick Evaluation

## 2021-09-21 ENCOUNTER — Encounter: Payer: Self-pay | Admitting: Ophthalmology

## 2021-11-24 ENCOUNTER — Other Ambulatory Visit: Payer: Self-pay | Admitting: Cardiovascular Disease

## 2021-11-27 ENCOUNTER — Telehealth: Payer: Self-pay | Admitting: Cardiovascular Disease

## 2021-11-27 NOTE — Telephone Encounter (Signed)
?*  STAT* If patient is at the pharmacy, call can be transferred to refill team. ? ? ?1. Which medications need to be refilled? (please list name of each medication and dose if known) nitroGLYCERIN (NITROSTAT) 0.3 MG SL tablet ? ?2. Which pharmacy/location (including street and city if local pharmacy) is medication to be sent to? [id number] - [place], [place] ? ?3. Do they need a 30 day or 90 day supply? 30 day  ?

## 2021-11-27 NOTE — Telephone Encounter (Signed)
Could you please verify patient should be taking Nitro 0.3 mg instead of 0.4 mg SL? Thank you! ?

## 2021-11-28 MED ORDER — NITROGLYCERIN 0.3 MG SL SUBL: 0.3000 mg | SUBLINGUAL_TABLET | SUBLINGUAL | 1 refills | Status: DC | PRN

## 2021-11-28 NOTE — Telephone Encounter (Signed)
Requested Prescriptions  ? ?Signed Prescriptions Disp Refills  ? nitroGLYCERIN (NITROSTAT) 0.3 MG SL tablet 25 tablet 1  ?  Sig: Place 1 tablet (0.3 mg total) under the tongue every 5 (five) minutes as needed for chest pain.  ?  Authorizing Provider: Minna Merritts  ?  Ordering User: Othelia Pulling C  ? ? ?

## 2022-03-19 ENCOUNTER — Other Ambulatory Visit: Payer: Self-pay | Admitting: Internal Medicine

## 2022-03-19 DIAGNOSIS — Z1231 Encounter for screening mammogram for malignant neoplasm of breast: Secondary | ICD-10-CM

## 2022-05-10 ENCOUNTER — Ambulatory Visit
Admission: RE | Admit: 2022-05-10 | Discharge: 2022-05-10 | Disposition: A | Discharge status: Home / Self Care | Payer: Medicare Other | Source: Ambulatory Visit | Attending: Internal Medicine | Admitting: Internal Medicine

## 2022-05-10 DIAGNOSIS — Z1231 Encounter for screening mammogram for malignant neoplasm of breast: Secondary | ICD-10-CM | POA: Insufficient documentation

## 2022-06-22 ENCOUNTER — Other Ambulatory Visit: Payer: Self-pay

## 2022-06-22 MED ORDER — EZETIMIBE 10 MG PO TABS: 10.0000 mg | ORAL_TABLET | Freq: Every day | ORAL | 0 refills | Status: DC

## 2022-08-19 NOTE — Progress Notes (Unsigned)
Date:  08/20/2022   ID:  Debra Moody, DOB [birth year], MRN [id number]  Patient Location:  [place] [address]   Provider location:   [facility], [place] office  PCP:  Rusty Aus, MD  Cardiologist:  Arvid Right [facility]  Chief Complaint  Patient presents with   12 month follow up     Patient c/o chest pain with having to take NTG tablets on several occasions and shortness of breath. Medications reviewed by the patient verbally.     History of Present Illness:    Debra Moody is a 82 y.o. female  past medical history of GERD, diverticulitis,  hysterectomy in the 80s, prior bladder surgery, elbow fracture October 2014,  shingles September 2015,  previously seen for chest pain, elevated cardiac enzymes,  stress related cardiomyopathy. 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   LOV December 2022  Rare chest pain, takes NTG as needed Does not happen on a regular basis, does not happen with exertion concerning for angina  Exercise limited by chronic foot pain Problems with her arches  Tolerating full dose Crestor 5 and Zetia daily  Some stressors at home, husband with cancer  Labs reviewed A1C 6.3 Total chol 168, LDL 78 CR 0.7  EKG personally reviewed by myself on todays visit Shows normal sinus rhythm rate 69 bpm no significant ST or T wave changes  Other past medical history reviewed Presented to the emergency room September 09, 2017 migraine Several hours later had central chest tightness nonradiating Chest pain resolved on its own  periodic diverticulitis Hx of osteoarthritis    she was traveling out of state, was in [place] when she developed amnesia type symptoms. Her husband took her to the hospital. She had MRI and CT of the head that showed no stroke. Further workup  showed abnormal EKG, abnormal echocardiogram, elevated troponin up to more than 3. Ischemia could not be excluded and she was taken to the cardiac catheterization lab 11/03/2014. 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 as there was LV dysfunction, ejection fraction estimated at 25% with anteroapical, apical and apical inferior wall akinesis   She was noted to have coronary spasm with a reversible 90% proximal RCA lesion, reversible 50% proximal left circumflex lesion She was discharged home on low-dose carvedilol and lisinopril    Past Medical History:  Diagnosis Date   Actinic keratosis 11/09/2008   L mid dorsum lat forearm - bx proven   Basal cell carcinoma 11/11/2007   R top of shoulder   Basal cell carcinoma 11/24/2013   L sup med scapula   Basal cell carcinoma 12/22/2013   Mid back spinal    Complication of anesthesia    Gas at dentist office "made me feel like I was going to die".   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   Severe damage to the left ventricle    Orthostatic hypotension    Osteoporosis    bone density test done 2019   Skin cancer, basal cell  Past Surgical History:  Procedure Laterality Date   ABDOMINAL HYSTERECTOMY     CARDIAC CATHETERIZATION  11/03/2014   CATARACT EXTRACTION W/PHACO Left 09/20/2021   Procedure: CATARACT EXTRACTION PHACO AND INTRAOCULAR LENS PLACEMENT ([place]) LEFT 5.68 00:56.7;  Surgeon: Leandrew Koyanagi, MD;  Location: [facility];  Service: Ophthalmology;  Laterality: Left;   COLONOSCOPY WITH PROPOFOL N/A 07/01/2017   Procedure: COLONOSCOPY WITH PROPOFOL;  Surgeon: Manya Silvas, MD;  Location: [facility];  Service: Endoscopy;  Laterality: N/A;    GUM SURGERY       Current Meds  Medication Sig   aspirin 81 MG tablet Take 81 mg by mouth daily.   Calcium-Magnesium-Vitamin D (CALCIUM 500 PO) Take 2,000 mg by mouth 2 (two) times daily.    Cholecalciferol (VITAMIN D) 125 MCG (5000 UT) CAPS Take by mouth.   Coenzyme Q10 (COQ10) 100 MG CAPS Take 100 mg by mouth daily.   Denosumab (PROLIA [place]) Inject 1 Units into the skin every 6 (six) months.   diphenhydrAMINE (BENADRYL) 25 mg capsule Take 25 mg by mouth every 6 (six) hours as needed.    DM-Doxylamine-Acetaminophen 15-6.25-325 MG/15ML LIQD Take by mouth.   docusate sodium (COLACE) 100 MG capsule Take 100 mg by mouth 2 (two) times daily.   ezetimibe (ZETIA) 10 MG tablet Take 1 tablet (10 mg total) by mouth daily. Please call [phone] to schedule an appointment for further refills   famotidine (PEPCID) 10 MG tablet Take 10 mg by mouth daily.   ibuprofen (ADVIL) 200 MG tablet Take 200 mg by mouth every 4 (four) hours as needed.    losartan (COZAAR) 25 MG tablet TAKE 1/2 TABLET(12.5 MG) BY MOUTH DAILY   Magnesium 400 MG CAPS Take by mouth daily.   Melatonin 5 MG TBDP Take 5 mg by mouth as needed.   metroNIDAZOLE (METROGEL) 0.75 % gel Apply 1 application topically 2 (two) times daily as needed.   mometasone (ELOCON) 0.1 % cream Apply to rash on chest QD-BID PRN flares.   nitrofurantoin, macrocrystal-monohydrate, (MACROBID) 100 MG capsule Take 100 mg by mouth 2 (two) times daily.   nitroGLYCERIN (NITROSTAT) 0.3 MG SL tablet Place 1 tablet (0.3 mg total) under the tongue every 5 (five) minutes as needed for chest pain.   Omega-3 Fatty Acids (FISH OIL) 1000 MG CAPS Take 1,200 mg by mouth daily.    Polyethylene Glycol 3350 (MIRALAX PO) Take by mouth every evening.   Probiotic Product (PROBIOTIC DAILY PO) Take by mouth daily.   Propylene Glycol (SYSTANE COMPLETE OP) Apply to eye as needed.   rosuvastatin (CRESTOR) 5 MG tablet TAKE 1 TABLET(5 MG) BY MOUTH DAILY   Simethicone 125 MG TABS Take by  mouth as needed.    TURMERIC PO Take by mouth. Turmeric 700/Apple cider Vinegar 500   Varenicline Tartrate (TYRVAYA) 0.03 MG/ACT SOLN daily.   vitamin B-12 (CYANOCOBALAMIN) 1000 MCG tablet Take 1,000 mcg by mouth daily.   zinc gluconate 50 MG tablet Take 50 mg by mouth daily.     Allergies:   Alendronate sodium, Ibandronic acid, Simvastatin, and Amoxicillin-pot clavulanate   Social History   Tobacco Use   Smoking status: Former    Years: 18.00    Types: Cigarettes    Quit date: 12/01/1999    Years since quitting: 22.7   Smokeless tobacco: Never  Vaping Use   Vaping Use: Never used  Substance Use Topics   Alcohol use: Yes    Comment: rare   Drug use: No  Family Hx: The patient's family history includes Heart attack in her mother; Heart attack (age of onset: 34) in her father; Heart disease in her father and mother. There is no history of Breast cancer.  ROS:   Please see the history of present illness.    Review of Systems  Constitutional: Negative.   HENT: Negative.    Respiratory: Negative.    Cardiovascular: Negative.   Gastrointestinal: Negative.   Musculoskeletal: Negative.        Foot pain  Neurological: Negative.   Psychiatric/Behavioral: Negative.    All other systems reviewed and are negative.    Labs/Other Tests and Data Reviewed:    Recent Labs: No results found for requested labs within last 365 days.   Recent Lipid Panel No results found for: "CHOL", "TRIG", "HDL", "CHOLHDL", "LDLCALC", "LDLDIRECT"  Wt Readings from Last 3 Encounters:  08/20/22 108 lb 6 oz (49.2 kg)  09/20/21 110 lb (49.9 kg)  07/17/21 111 lb 8 oz (50.6 kg)     Exam:    BP (!) 104/58 (BP Location: Left Arm, Patient Position: Sitting, Cuff Size: Normal)   Pulse 69   Ht '5\' 3"'$  (1.6 m)   Wt 108 lb 6 oz (49.2 kg)   SpO2 96%   BMI 19.20 kg/m   Constitutional:  oriented to person, place, and time. No distress.  HENT:  Head: Grossly normal Eyes:  no discharge. No scleral  icterus.  Neck: No JVD, no carotid bruits  Cardiovascular: Regular rate and rhythm, no murmurs appreciated Pulmonary/Chest: Clear to auscultation bilaterally, no wheezes or rails Abdominal: Soft.  no distension.  no tenderness.  Musculoskeletal: Normal range of motion Neurological:  normal muscle tone. Coordination normal. No atrophy Skin: Skin warm and dry Psychiatric: normal affect, pleasant  ASSESSMENT & PLAN:    Takotsubo syndrome At her baseline, no recent stressors normal ejection fraction on last 2 echos No medication changes made, we have recommended walking program as tolerated given foot pain  Congestive dilated cardiomyopathy (HCC) Euvolemic, no medication changes EKG unchanged, blood pressure low but stable  Coronary artery disease involving native coronary artery of native heart without angina pectoris Rare spasm relieved with nitro . Recommended for escalation of symptoms she call our office  Anxiety Recommend continued stress reduction techniques  Mixed hyperlipidemia On crestor 5 daily and Zetia 10 daily, total cholesterol 160 range  Orthostatic hypotension Denies any orthostasis symptoms, blood pressure stable Stay hydrated  Shortness of breath Exercise for conditioning Stable    Total encounter time more than 30 minutes  Greater than 50% was spent in counseling and coordination of care with the patient   Signed, Ida Rogue, MD  08/20/2022 2:10 PM    [facility] [address] [place] [address], [place], [place] [address]

## 2022-08-20 ENCOUNTER — Encounter: Payer: Self-pay | Admitting: Cardiovascular Disease

## 2022-08-20 ENCOUNTER — Ambulatory Visit: Payer: Medicare Other | Attending: Cardiovascular Disease | Admitting: Cardiovascular Disease

## 2022-08-20 VITALS — BP 104/58 | HR 69 | Ht 63.0 in | Wt 108.4 lb

## 2022-08-20 DIAGNOSIS — R0602 Shortness of breath: Secondary | ICD-10-CM

## 2022-08-20 DIAGNOSIS — I2510 Atherosclerotic heart disease of native coronary artery without angina pectoris: Secondary | ICD-10-CM | POA: Diagnosis not present

## 2022-08-20 DIAGNOSIS — I951 Orthostatic hypotension: Secondary | ICD-10-CM

## 2022-08-20 DIAGNOSIS — I420 Dilated cardiomyopathy: Secondary | ICD-10-CM | POA: Diagnosis not present

## 2022-08-20 DIAGNOSIS — E782 Mixed hyperlipidemia: Secondary | ICD-10-CM

## 2022-08-20 DIAGNOSIS — I5181 Takotsubo syndrome: Secondary | ICD-10-CM

## 2022-08-20 MED ORDER — NITROGLYCERIN 0.3 MG SL SUBL: 0.3000 mg | SUBLINGUAL_TABLET | SUBLINGUAL | 3 refills | Status: DC | PRN

## 2022-08-20 MED ORDER — LOSARTAN POTASSIUM 25 MG PO TABS: ORAL_TABLET | ORAL | 3 refills | Status: DC

## 2022-08-20 MED ORDER — EZETIMIBE 10 MG PO TABS: 10.0000 mg | ORAL_TABLET | Freq: Every day | ORAL | 3 refills | Status: DC

## 2022-08-20 MED ORDER — ROSUVASTATIN CALCIUM 5 MG PO TABS: ORAL_TABLET | ORAL | 3 refills | Status: DC

## 2022-08-20 NOTE — Patient Instructions (Addendum)
[facility] Brass: orthotics: [phone]  Medication Instructions:  No changes  If you need a refill on your cardiac medications before your next appointment, please call your pharmacy.   Lab work: No new labs needed  Testing/Procedures: No new testing needed  Follow-Up: At [facility], you and your health needs are our priority.  As part of our continuing mission to provide you with exceptional heart care, we have created designated Provider Care Teams.  These Care Teams include your primary Cardiologist (physician) and Advanced Practice Providers (APPs -  Physician Assistants and Nurse Practitioners) who all work together to provide you with the care you need, when you need it.  You will need a follow up appointment in 12 months  Providers on your designated Care Team:   Murray Hodgkins, NP Christell Faith, PA-C Cadence Kathlen Mody, [place]  COVID-19 Vaccine Information can be found at: [email or web address].uk For questions related to vaccine distribution or appointments, please email [email or web address] or call [phone].

## 2022-08-27 ENCOUNTER — Ambulatory Visit: Payer: Medicare Other | Admitting: Dermatology

## 2022-10-18 ENCOUNTER — Ambulatory Visit (INDEPENDENT_AMBULATORY_CARE_PROVIDER_SITE_OTHER): Payer: Medicare Other | Admitting: Dermatology

## 2022-10-18 VITALS — BP 141/69

## 2022-10-18 DIAGNOSIS — R21 Rash and other nonspecific skin eruption: Secondary | ICD-10-CM | POA: Diagnosis not present

## 2022-10-18 DIAGNOSIS — Z86018 Personal history of other benign neoplasm: Secondary | ICD-10-CM

## 2022-10-18 DIAGNOSIS — L821 Other seborrheic keratosis: Secondary | ICD-10-CM

## 2022-10-18 DIAGNOSIS — L820 Inflamed seborrheic keratosis: Secondary | ICD-10-CM | POA: Diagnosis not present

## 2022-10-18 DIAGNOSIS — L719 Rosacea, unspecified: Secondary | ICD-10-CM | POA: Diagnosis not present

## 2022-10-18 DIAGNOSIS — Z1283 Encounter for screening for malignant neoplasm of skin: Secondary | ICD-10-CM | POA: Diagnosis not present

## 2022-10-18 DIAGNOSIS — Z79899 Other long term (current) drug therapy: Secondary | ICD-10-CM

## 2022-10-18 DIAGNOSIS — L578 Other skin changes due to chronic exposure to nonionizing radiation: Secondary | ICD-10-CM

## 2022-10-18 DIAGNOSIS — Z85828 Personal history of other malignant neoplasm of skin: Secondary | ICD-10-CM

## 2022-10-18 DIAGNOSIS — D229 Melanocytic nevi, unspecified: Secondary | ICD-10-CM

## 2022-10-18 DIAGNOSIS — L814 Other melanin hyperpigmentation: Secondary | ICD-10-CM

## 2022-10-18 DIAGNOSIS — Z7189 Other specified counseling: Secondary | ICD-10-CM

## 2022-10-18 DIAGNOSIS — D1801 Hemangioma of skin and subcutaneous tissue: Secondary | ICD-10-CM

## 2022-10-18 DIAGNOSIS — L299 Pruritus, unspecified: Secondary | ICD-10-CM

## 2022-10-18 NOTE — Progress Notes (Signed)
Follow-Up Visit   Subjective  Debra Moody is a 82 y.o. female who presents for the following: Annual Exam (History of BCC and dysplastic nevus - The patient presents for Total-Body Skin Exam (TBSE) for skin cancer screening and mole check.  The patient has spots, moles and lesions to be evaluated, some may be new or changing and the patient has concerns that these could be cancer./).  The following portions of the chart were reviewed this encounter and updated as appropriate:   Tobacco  Allergies  Meds  Problems  Med Hx  Surg Hx  Fam Hx     Review of Systems:  No other skin or systemic complaints except as noted in HPI or Assessment and Plan.  Objective  Well appearing patient in no apparent distress; mood and affect are within normal limits.  A full examination was performed including scalp, head, eyes, ears, nose, lips, neck, chest, axillae, abdomen, back, buttocks, bilateral upper extremities, bilateral lower extremities, hands, feet, fingers, toes, fingernails, and toenails. All findings within normal limits unless otherwise noted below.  Mid Back Clear today  Left Zygomatic Area Erythematous stuck-on, waxy papule or plaque       Face Erythema    Assessment & Plan   History of Basal Cell Carcinoma of the Skin - No evidence of recurrence today - Recommend regular full body skin exams - Recommend daily broad spectrum sunscreen SPF 30+ to sun-exposed areas, reapply every 2 hours as needed.  - Call if any new or changing lesions are noted between office visits  History of Dysplastic Nevi - No evidence of recurrence today - Recommend regular full body skin exams - Recommend daily broad spectrum sunscreen SPF 30+ to sun-exposed areas, reapply every 2 hours as needed.  - Call if any new or changing lesions are noted between office visits  Lentigines - Scattered tan macules - Due to sun exposure - Benign-appearing, observe - Recommend daily broad spectrum  sunscreen SPF 30+ to sun-exposed areas, reapply every 2 hours as needed. - Call for any changes  Seborrheic Keratoses - Stuck-on, waxy, tan-brown papules and/or plaques  - Benign-appearing - Discussed benign etiology and prognosis. - Observe - Call for any changes  Melanocytic Nevi - Tan-brown and/or pink-flesh-colored symmetric macules and papules - Benign appearing on exam today - Observation - Call clinic for new or changing moles - Recommend daily use of broad spectrum spf 30+ sunscreen to sun-exposed areas.   Hemangiomas - Red papules - Discussed benign nature - Observe - Call for any changes  Actinic Damage - Chronic condition, secondary to cumulative UV/sun exposure - diffuse scaly erythematous macules with underlying dyspigmentation - Recommend daily broad spectrum sunscreen SPF 30+ to sun-exposed areas, reapply every 2 hours as needed.  - Staying in the shade or wearing long sleeves, sun glasses (UVA+UVB protection) and wide brim hats (4-inch brim around the entire circumference of the hat) are also recommended for sun protection.  - Call for new or changing lesions.  Skin cancer screening performed today.  Rash with Itch Associated with taking bath Urticaria with pruritus Mid Back Recommend starting Claritin or Allegra daily in the afternoon to help prevent itching after evening bath.  Inflamed seborrheic keratosis Left Zygomatic Area See photo -- recheck next visit Destruction of lesion - Left Zygomatic Area Complexity: simple   Destruction method: cryotherapy   Informed consent: discussed and consent obtained   Timeout:  patient name, date of birth, surgical site, and procedure verified Lesion destroyed  using liquid nitrogen: Yes   Region frozen until ice ball extended beyond lesion: Yes   Outcome: patient tolerated procedure well with no complications   Post-procedure details: wound care instructions given    Rosacea Face Rosacea is a chronic  progressive skin condition usually affecting the face of adults, causing redness and/or acne bumps. It is treatable but not curable. It sometimes affects the eyes (ocular rosacea) as well. It may respond to topical and/or systemic medication and can flare with stress, sun exposure, alcohol, exercise, topical steroids (including hydrocortisone/cortisone 10) and some foods.  Daily application of broad spectrum spf 30+ sunscreen to face is recommended to reduce flares. Counseling for BBL / IPL / Laser and Coordination of Care Discussed the treatment option of Broad Band Light (BBL) /Intense Pulsed Light (IPL)/ Laser for skin discoloration, including brown spots and redness.  Typically we recommend at least 1-3 treatment sessions about 5-8 weeks apart for best results.  Cannot have tanned skin when BBL performed, and regular use of sunscreen is advised after the procedure to help maintain results. The patient's condition may also require "maintenance treatments" in the future.  The fee for BBL / laser treatments is $350 per treatment session for the whole face.  A fee can be quoted for other parts of the body.  Insurance typically does not pay for BBL/laser treatments and therefore the fee is an out-of-pocket cost.  Will prescribe [facility] metronidazole/ivermectin/azelaic acid twice daily as needed to affected areas on the face. The patient was advised this is not covered by insurance since it is made by a compounding pharmacy. They will receive an email to check out and the medication will be mailed to their home.    Return in about 1 year (around 10/18/2023) for TBSE.  I, Ashok Cordia, CMA, am acting as scribe for Sarina Ser, MD . Documentation: I have reviewed the above documentation for accuracy and completeness, and I agree with the above.  Sarina Ser, MD

## 2022-10-18 NOTE — Patient Instructions (Addendum)
Recommend starting Claritin or Allegra daily in the afternoon to help prevent itching after evening bath.  Instructions for [facility] Medications  One or more of your medications was sent to the [facility] mail order compounding pharmacy. You will receive an email from them and can purchase the medicine through that link. It will then be mailed to your home at the address you confirmed. If for any reason you do not receive an email from them, please check your spam folder. If you still do not find the email, please let us know. [facility] phone number is [phone].   Cryotherapy Aftercare  Wash gently with soap and water everyday.   Apply Vaseline and Band-Aid daily until healed.    Due to recent changes in healthcare laws, you may see results of your pathology and/or laboratory studies on MyChart before the doctors have had a chance to review them. We understand that in some cases there may be results that are confusing or concerning to you. Please understand that not all results are received at the same time and often the doctors may need to interpret multiple results in order to provide you with the best plan of care or course of treatment. Therefore, we ask that you please give [place] 2 business days to thoroughly review all your results before contacting the office for clarification. Should we see a critical lab result, you will be contacted sooner.   If You Need Anything After Your Visit  If you have any questions or concerns for your doctor, please call our main line at [phone] and press option 4 to reach your doctor's medical assistant. If no one answers, please leave a voicemail as directed and we will return your call as soon as possible. Messages left after 4 pm will be answered the following business day.   You may also send [place] a message via [place]. We typically respond to MyChart messages within 1-2 business days.  For prescription refills, please ask your  pharmacy to contact our office. Our fax number is [phone].  If you have an urgent issue when the clinic is closed that cannot wait until the next business day, you can page your doctor at the number below.    Please note that while we do our best to be available for urgent issues outside of office hours, we are not available 24/7.   If you have an urgent issue and are unable to reach [place], you may choose to seek medical care at your doctor's office, retail clinic, urgent care center, or emergency room.  If you have a medical emergency, please immediately call 911 or go to the emergency department.  Pager Numbers  - Dr. Nehemiah Massed: [phone]  - Dr. Laurence Ferrari: [phone]  - Dr. Nicole Kindred: [phone]  In the event of inclement weather, please call our main line at [phone] for an update on the status of any delays or closures.  Dermatology Medication Tips: Please keep the boxes that topical medications come in in order to help keep track of the instructions about where and how to use these. Pharmacies typically print the medication instructions only on the boxes and not directly on the medication tubes.   If your medication is too expensive, please contact our office at [phone] option 4 or send [place] a message through [place].   We are unable to tell what your co-pay for medications will be in advance as this is different depending on your insurance coverage. However, we may be able to  find a substitute medication at lower cost or fill out paperwork to get insurance to cover a needed medication.   If a prior authorization is required to get your medication covered by your insurance company, please allow [place] 1-2 business days to complete this process.  Drug prices often vary depending on where the prescription is filled and some pharmacies may offer cheaper prices.  The website www.goodrx.com contains coupons for medications through different pharmacies. The prices here do not  account for what the cost may be with help from insurance (it may be cheaper with your insurance), but the website can give you the price if you did not use any insurance.  - You can print the associated coupon and take it with your prescription to the pharmacy.  - You may also stop by our office during regular business hours and pick up a GoodRx coupon card.  - If you need your prescription sent electronically to a different pharmacy, notify our office through [facility] or by phone at [phone] option 4.     Si Usted Necesita Algo Despus de Su Visita  Tambin puede enviarnos un mensaje a travs de Pharmacist, community. Por lo general respondemos a los mensajes de MyChart en el transcurso de 1 a 2 das hbiles.  Para renovar recetas, por favor pida a su farmacia que se ponga en contacto con nuestra oficina. Harland Dingwall de fax es [place] [phone].  Si tiene un asunto urgente cuando la clnica est cerrada y que no puede esperar hasta el siguiente da hbil, puede llamar/localizar a su doctor(a) al nmero que aparece a continuacin.   Por favor, tenga en cuenta que aunque hacemos todo lo posible para estar disponibles para asuntos urgentes fuera del horario de [place], no estamos disponibles las 24 horas del da, los 7 das de la [place].   Si tiene un problema urgente y no puede comunicarse con nosotros, puede optar por buscar atencin mdica  en el consultorio de su doctor(a), en una clnica privada, en un centro de atencin urgente o en una sala de emergencias.  Si tiene Engineering geologist, por favor llame inmediatamente al 911 o vaya a la sala de emergencias.  Nmeros de bper  - Dr. Nehemiah Massed: [phone]  - Dra. Moye: [phone]  - Dra. Nicole Kindred: [phone]  En caso de inclemencias del [place], por favor llame a Johnsie Kindred principal al [phone] para una actualizacin sobre el [address] de cualquier retraso o cierre.  Consejos para la medicacin en dermatologa: Por  favor, guarde las cajas en las que vienen los medicamentos de uso tpico para ayudarle a seguir las instrucciones sobre dnde y cmo usarlos. Las farmacias generalmente imprimen las instrucciones del medicamento slo en las cajas y no directamente en los tubos del [place].   Si su medicamento es muy caro, por favor, pngase en contacto con Zigmund Daniel llamando al [phone] y presione la opcin 4 o envenos un mensaje a travs de Pharmacist, community.   No podemos decirle cul ser su copago por los medicamentos por adelantado ya que esto es diferente dependiendo de la cobertura de su seguro. Sin embargo, es posible que podamos encontrar un medicamento sustituto a Electrical engineer un formulario para que el seguro cubra el medicamento que se considera necesario.   Si se requiere una autorizacin previa para que su compaa de seguros [place] su medicamento, por favor permtanos de 1 a 2 das hbiles para completar este proceso.  Los precios de los medicamentos varan con frecuencia dependiendo  del lugar de dnde se surte la receta y alguna farmacias pueden ofrecer precios ms baratos.  El sitio web www.goodrx.com tiene cupones para medicamentos de Airline pilot. Los precios aqu no tienen en cuenta lo que podra costar con la ayuda del seguro (puede ser ms barato con su seguro), pero el sitio web puede darle el precio si no utiliz Research scientist (physical sciences).  - Puede imprimir el cupn correspondiente y llevarlo con su receta a la farmacia.  - Tambin puede pasar por nuestra oficina durante el horario de atencin regular y Charity fundraiser una tarjeta de cupones de GoodRx.  - Si necesita que su receta se enve electrnicamente a una farmacia diferente, informe a nuestra oficina a travs de MyChart de [place] o por telfono llamando al [phone] y presione la opcin 4.

## 2022-10-21 ENCOUNTER — Encounter: Payer: Self-pay | Admitting: Dermatology

## 2022-10-23 ENCOUNTER — Encounter: Payer: Self-pay | Admitting: Dermatology

## 2022-10-29 MED ORDER — DOXYCYCLINE HYCLATE 20 MG PO TABS: ORAL_TABLET | ORAL | 3 refills | Status: DC

## 2023-04-11 ENCOUNTER — Other Ambulatory Visit: Payer: Self-pay | Admitting: Internal Medicine

## 2023-04-11 DIAGNOSIS — Z1231 Encounter for screening mammogram for malignant neoplasm of breast: Secondary | ICD-10-CM

## 2023-05-14 ENCOUNTER — Ambulatory Visit
Admission: RE | Admit: 2023-05-14 | Discharge: 2023-05-14 | Disposition: A | Discharge status: Home / Self Care | Payer: Medicare Other | Source: Ambulatory Visit | Attending: Internal Medicine | Admitting: Internal Medicine

## 2023-05-14 DIAGNOSIS — Z1231 Encounter for screening mammogram for malignant neoplasm of breast: Secondary | ICD-10-CM | POA: Diagnosis present

## 2023-08-22 ENCOUNTER — Encounter: Payer: Self-pay | Admitting: Physical Therapy

## 2023-08-22 ENCOUNTER — Ambulatory Visit: Payer: Medicare Other | Attending: Obstetrics and Gynecology | Admitting: Physical Therapy

## 2023-08-22 DIAGNOSIS — R278 Other lack of coordination: Secondary | ICD-10-CM | POA: Insufficient documentation

## 2023-08-22 DIAGNOSIS — R2689 Other abnormalities of gait and mobility: Secondary | ICD-10-CM | POA: Insufficient documentation

## 2023-08-22 DIAGNOSIS — R293 Abnormal posture: Secondary | ICD-10-CM | POA: Insufficient documentation

## 2023-08-22 DIAGNOSIS — M533 Sacrococcygeal disorders, not elsewhere classified: Secondary | ICD-10-CM | POA: Insufficient documentation

## 2023-08-22 NOTE — Patient Instructions (Addendum)
 Avoid straining pelvic floor, abdominal muscles , spine  Use log rolling technique instead of getting out of bed with your neck or the sit-up     Log rolling into and out of bed   Log rolling into and out of bed If getting out of bed on R side, Bent knees, scoot hips/ shoulder to L  Raise R arm completely overhead, rolling onto armpit  Then lower bent knees to bed to get into complete side lying position  Then drop legs off bed, and push up onto R elbow/forearm, and use L hand to push onto the bed  __   Proper body mechanics with getting out of a chair to decrease strain  on back &pelvic floor   Avoid holding your breath when Getting out of the chair:  Scoot to front part of chair chair Heels behind knees, feet are hip width apart, nose over toes  Inhale like you are smelling roses Exhale to stand   __  Sitting with feet on ground, four points of contact Catch yourself crossing ankles and thighs  __  Stop sit up and crunches

## 2023-08-22 NOTE — Therapy (Signed)
 OUTPATIENT PHYSICAL THERAPY EVALUATION     Patient Name: Debra Moody MRN: [id number] DOB:Feb 03, 1941, 83 y.o., female Today's Date: 08/22/2023   PT End of Session - 08/22/23 0939     Visit Number 1    Number of Visits 10    Date for PT Re-Evaluation 10/31/23    PT Start Time 0933    PT Stop Time 1015    PT Time Calculation (min) 42 min    Activity Tolerance Patient tolerated treatment well;No increased pain    Behavior During Therapy WFL for tasks assessed/performed             Past Medical History:  Diagnosis Date   Actinic keratosis 11/09/2008   L mid dorsum lat forearm - bx proven   Basal cell carcinoma 11/11/2007   R top of shoulder   Basal cell carcinoma 11/24/2013   L sup med scapula   Basal cell carcinoma 12/22/2013   Mid back spinal    Complication of anesthesia    Gas at dentist office made me feel like I was going to die.   Diverticulitis    Dysplastic nevus    left forearm   Family history of adverse reaction to anesthesia    Mother - Rash   GERD (gastroesophageal reflux disease)    History of shingles 2015   Hyperlipidemia    Hypertension    MI (myocardial infarction) (HCC) 11/03/2014   Severe damage to the left ventricle    Orthostatic hypotension    Osteoporosis    bone density test done 2019   Skin cancer, basal cell    Past Surgical History:  Procedure Laterality Date   ABDOMINAL HYSTERECTOMY     CARDIAC CATHETERIZATION  11/03/2014   CATARACT EXTRACTION W/PHACO Left 09/20/2021   Procedure: CATARACT EXTRACTION PHACO AND INTRAOCULAR LENS PLACEMENT (IOC) LEFT 5.68 00:56.7;  Surgeon: Mittie Gaskin, MD;  Location: [facility];  Service: Ophthalmology;  Laterality: Left;   COLONOSCOPY WITH PROPOFOL  N/A 07/01/2017   Procedure: COLONOSCOPY WITH PROPOFOL ;  Surgeon: Viktoria Lamar DASEN, MD;  Location: [facility];  Service: Endoscopy;  Laterality: N/A;   GUM SURGERY     Patient Active Problem List   Diagnosis Date Noted   Shortness  of breath 10/22/2017   Coronary artery disease involving native coronary artery of native heart without angina pectoris 09/05/2016   Orthostatic hypotension 03/07/2015   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  Pt is taking premarin cream as needed. Pt is not having the spasms since taking premarin. Pt also uses jet stream in her tub with epsom salts.   2) incomplete urination occurs 60% of the time . Has no issues starting urine stream . Pt strains 5% of the time with bowel movements. Colon resection due to diverticulitis . Pt has to take Miralax and Colace to have bowel movements which now occurs 1-2 x day with Type 4 stool consistency. Pt has not had an infection from diverticulitis for 1.5 years.    3 )  R shoulder pain - 5/10 pain occurs inconsistently. The past 2 weeks, she notices it hurts putting hand behind back and raising arm . Radiates with neck to acromium ( pt points) .  Pt and husband lift weights on [employer] but stopped due to shoulder pain.    4)  R sciatic pain- occurs 4-5/10 pain with sitting for 4-5 hours, standing > 30 min , radiates to side of thigh to knee   5) Feet pain:  Hx  broken bones in R foot 2024, L 2023.  Pt declined  wearing a boot and surgery.  Feet pain occurs after walking 2 miles and she wakes up with an ache in B foot.  Pt has to wear a sleeve on a foot all the time when exercising and walking but in the house.  Pt walks barefoot in the house. Pt wears orthopedic inserts in her tennis shoes.   PERTINENT HISTORY:  Osteoporosis and gets Tx every 6 months  Colon resection due to diverticulitis  Abdominal surgeries for multiple ovarian cyst removals, hysterectomy, bladder tack One vaginal delivery with episiotomy  Heart attack 2016 with nitro/ still takes medication     Walks with husband 1-2 miles everyday , workout:  crunches/ sit ups    PAIN:  Are you having pain? Yes: see above   PRECAUTIONS: No  WEIGHT BEARING RESTRICTIONS:  No   FALLS:  Has patient fallen in last 6 months? Yes because of the ringing to her ears which affected her balance. Had ringing in her ears for 22+  years  LIVING ENVIRONMENT: Lives with: husband  Lives in: two story house  Stairs: a flight  Has following equipment at home:   OCCUPATION: retired psychologist, counselling   PLOF: IND   PATIENT GOALS:  Improve pain and walking with more balance   OBJECTIVE:   [facility] PT Assessment - 08/22/23 0941       Observation/Other Assessments   Observations ankles crossed      Strength   Overall Strength Comments RLE 3/5, L 5/5  LE   Pain with shoulder flexion/ ext on RUE but strength bilaterally, plan to assess more movements      Ambulation/Gait   Gait Comments 1.3 m/s , excessive sway to R pelvis, minimal armswings             [facility] Adult PT Treatment/Exercise - 08/22/23 0941       Therapeutic Activites    Other Therapeutic Activities explained role of posture, avoiding crunches/ sit ups, plan to assess R shoulder and feet upcoming sessions, explained whole person approach for pelvic / spine/ feet issues      Neuro Re-ed    Neuro Re-ed Details  cued for sitting  posture,  sit to stand               HOME  EXERCISE PROGRAM: See pt instruction section    ASSESSMENT:  CLINICAL IMPRESSION:  Pt is a 83  yo  who presents with  pelvic pain, incomplete urination,  R shoulder pain,  R sciatic pain, B feet pain which impact QOL, ADL, fitness, and community activities.   Pt's musculoskeletal assessment revealed uneven pelvic girdle and shoulder height, asymmetries to gait pattern, limited spinal /pelvic mobility, dyscoordination and strength of pelvic floor mm, hip weakness, pain with shoulder flexion/ ext, poor body mechanics which places strain on the abdominal/pelvic floor mm. These are deficits that indicate an ineffective intraabdominal pressure system associated with increased risk for pt's Sx.   Advised pt to not perform sit-ups and crunches as these movement patterns lead to more downward forces on the pelvic floor, negatively impacting abdominopelvic/spinal dysfunctions.   Pt was provided education on etiology of Sx with anatomy, physiology explanation with images along with the benefits of customized pelvic PT Tx based on pt's medical conditions and musculoskeletal deficits.  Explained the physiology of deep core mm coordination and roles of pelvic floor function in urination, defecation, sexual function, and postural control with deep core mm system.  Regional interdependent approaches will yield greater benefits in pt's POC.  Following Tx today which pt tolerated without complaints,  pt demo'd proper body mechanics to minimize straining pelvic floor.   Plan to address realignment of spine/ pelvis. Plan to further assess BUE and feet / LKC at upcoming sessions. Whole person and structural alignment corrections will help promote optimize IAP system for improved pelvic floor function, trunk stability, gait, balance, stabilization with mobility tasks, minimize sciatica, shoulder, low back pain.  Plan to address pelvic floor issues once pelvis and spine are realigned to yield better outcomes.    Pt  benefits from skilled PT.    OBJECTIVE IMPAIRMENTS decreased activity tolerance, decreased coordination, decreased endurance, decreased mobility, difficulty walking, decreased ROM, decreased strength, decreased safety awareness, hypomobility, increased muscle spasms, impaired flexibility, improper body mechanics, postural dysfunction, and pain. scar restrictions   ACTIVITY LIMITATIONS  self-care,  home chores, work tasks    PARTICIPATION LIMITATIONS:  community, hobbies activities    PERSONAL FACTORS        are also affecting patient's functional outcome.    REHAB POTENTIAL: Good   CLINICAL DECISION MAKING: Evolving/moderate complexity   EVALUATION COMPLEXITY: Moderate    PATIENT EDUCATION:    Education details: Showed pt anatomy images. Explained muscles attachments/ connection, physiology of deep core system/ spinal- thoracic-pelvis-lower kinetic chain as they relate to pt's presentation, Sx, and past Hx. Explained what and how these areas of deficits need to be restored to balance and function    See Therapeutic activity / neuromuscular re-education section  Answered pt's questions.   Person educated: Patient Education method: Explanation, Demonstration, Tactile cues, Verbal cues, and Handouts Education comprehension: verbalized understanding, returned demonstration, verbal cues required, tactile cues required, and needs further education     PLAN: PT FREQUENCY: 1x/week   PT DURATION: 10 weeks   PLANNED INTERVENTIONS: Therapeutic exercises, Therapeutic activity, Neuromuscular re-education, Balance training, Gait training, Patient/Family education, Self Care, Joint mobilization, Spinal mobilization, Moist heat, Taping, and Manual therapy, dry needling.   PLAN FOR NEXT SESSION: See clinical impression for plan     GOALS: Goals reviewed with patient? Yes  SHORT TERM GOALS: Target date: 09/19/2023    Pt will demo IND with  HEP                    Baseline: Not IND             Goal status: INITIAL   LONG TERM GOALS: Target date: 10/31/2023    1.Pt will demo proper deep core coordination without chest breathing and optimal excursion of diaphragm/pelvic floor in order to promote spinal stability and pelvic floor function  Baseline: dyscoordination Goal status: INITIAL  2.  Pt will demo > 5 pt change on FOTO  to improve QOL and function  PFDI Urinary baseline - 25 Lower score = better function   Pelvic Pain baseline - 42 Lower score = better function   PFDI Bowel - 13 Prolapse - 33 Higher score = better function  Lumber baseline  - 54  Higher score = better function   Goal status: INITIAL  3.  Pt will demo proper body mechanics in against gravity tasks and ADLs  work tasks, fitness  to minimize straining pelvic floor / back    Baseline: not IND, improper form that places strain on pelvic floor  Goal status: INITIAL    4. Pt will demo increased gait speed > 1.5 m/s with reciprocal gait pattern, longer stride length  in order to ambulate safely in community and return to fitness routine  Baseline: 1.3 m/s , excessive sway to R pelvis, minimal armswings  Goal status: INITIAL    5. Pt will demo no pain with  putting hand behind back and raising arm and  radiating pain at acromium in order to return to lifting weights to minimize osteoporosis worsening  Baseline:  Shoulder 5/10 pain occurs inconsistently. The past 2 weeks, she notices it hurts putting hand behind back and raising arm . Radiates with neck to acromium ( pt points)  Goal status: INITIAL   6. Pt will report no sciatic pain across 2 weeks , sitting for 4-5 hours one trip, standing > 30 min with no radiating pain , < 2/10  Baseline: R sciatica occurs 4-5/10 pain with sitting for 4-5 hours, standing > 30 min , radiates to side of thigh to knee  Goal status: INITIAL  7. Pt will demo levelled pelvic girdle and shoulder height in order to progress to deep core strengthening HEP and  restore mobility at spine, pelvis, gait, posture minimize falls, and improve balance   Baseline: L shoulder, R iliac crest lowered Goal Status:  INITIAL    Pia Lupe Plump, PT 08/22/2023, 9:48 AM

## 2023-08-29 ENCOUNTER — Ambulatory Visit: Payer: Medicare Other | Admitting: Physical Therapy

## 2023-08-29 DIAGNOSIS — R2689 Other abnormalities of gait and mobility: Secondary | ICD-10-CM | POA: Diagnosis not present

## 2023-08-29 DIAGNOSIS — R293 Abnormal posture: Secondary | ICD-10-CM

## 2023-08-29 DIAGNOSIS — R278 Other lack of coordination: Secondary | ICD-10-CM

## 2023-08-29 NOTE — Patient Instructions (Signed)
Themes:   Feet and knees hip width    No lifting up head to scoot,  -- 4 points of contact ot scoot , arm overthead to rollover   Getting out of bed: logrolling   raise L arm over, roll more to look at the ground to load weight on R arm 30% to minimize R shoulder pain     Inhale and exhale through nose      Slow moveents with coordinated exhale with the movement  __  Modifiy workout:  No more planks / incline pushups / weight for now   while shoulder is healing   No more touch floor to minimize back pain and prolapse    __  Hand position and avoiding internal rotation:  Lat push ups -- with hand by hips, 9=fingers 0 deg pointed away from thighs , shoulder blades slide down and back Angels wings, palms and squeeze shoulder blades down and back ( stop at V) and not by ears  Legs lifts -opp knee bent, foot hip width apart, points of contact at upper body , no lifting head   Butterfly:  feet hip width apart, coordinate opening knees with exhale while  points of contact at upper body , no lifting head

## 2023-08-29 NOTE — Therapy (Signed)
OUTPATIENT PHYSICAL THERAPY TREATMENT      Patient Name: MICHIAH BEDA MRN: [id number] DOB:[birth year], 83 y.o., female Today's Date: 08/29/2023   PT End of Session - 08/29/23 1025     Visit Number 2    Number of Visits 10    Date for PT Re-Evaluation 10/31/23    PT Start Time 0940    PT Stop Time 1025    PT Time Calculation (min) 45 min    Activity Tolerance Patient tolerated treatment well;No increased pain    Behavior During Therapy WFL for tasks assessed/performed             Past Medical History:  Diagnosis Date   Actinic keratosis 11/09/2008   L mid dorsum lat forearm - bx proven   Basal cell carcinoma 11/11/2007   R top of shoulder   Basal cell carcinoma 11/24/2013   L sup med scapula   Basal cell carcinoma 12/22/2013   Mid back spinal    Complication of anesthesia    Gas at dentist office "made me feel like I was going to die".   Diverticulitis    Dysplastic nevus    left forearm   Family history of adverse reaction to anesthesia    Mother - Rash   GERD (gastroesophageal reflux disease)    History of shingles 2015   Hyperlipidemia    Hypertension    MI (myocardial infarction) (HCC) 11/03/2014   Severe damage to the left ventricle    Orthostatic hypotension    Osteoporosis    bone density test done 2019   Skin cancer, basal cell    Past Surgical History:  Procedure Laterality Date   ABDOMINAL HYSTERECTOMY     bladder tack     CARDIAC CATHETERIZATION  11/03/2014   CATARACT EXTRACTION W/PHACO Left 09/20/2021   Procedure: CATARACT EXTRACTION PHACO AND INTRAOCULAR LENS PLACEMENT (IOC) LEFT 5.68 00:56.7;  Surgeon: Lockie Mola, MD;  Location: [facility];  Service: Ophthalmology;  Laterality: Left;   COLONOSCOPY WITH PROPOFOL N/A 07/01/2017   Procedure: COLONOSCOPY WITH PROPOFOL;  Surgeon: Scot Jun, MD;  Location: [facility];  Service: Endoscopy;  Laterality: N/A;   GUM SURGERY     Patient Active Problem List   Diagnosis  Date Noted   Shortness of breath 10/22/2017   Coronary artery disease involving native coronary artery of native heart without angina pectoris 09/05/2016   Orthostatic hypotension 03/07/2015   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   Anxiety 12/01/2014   Takotsubo syndrome 12/01/2014   Hyperlipidemia 12/01/2014    PCP: Bethann Punches MD   REFERRING PROVIDER: Christeen Douglas MD   REFERRING DIAG: Myagia   Rationale for Evaluation and Treatment Rehabilitation  THERAPY DIAG:  Other abnormalities of gait and mobility  Abnormal posture  Other lack of coordination  ONSET DATE:   SUBJECTIVE:  SUBJECTIVE STATEMENT TODAY:  Pt had a massage and the massage therapy taped her shoulder which helped with pain   SUBJECTIVE STATEMENT ON EVAL 08/22/23 : 1) pelvic pain:   mm spasms which caused pt to not be able to sit and she had burning sensation outside of vagina and pain with sexual intercourse.  Pt is taking premarin cream as needed. Pt is not having the spasms since taking premarin. Pt also uses jet stream in her tub with epsom salts.   2) incomplete urination occurs 60% of the time . Has no issues starting urine stream . Pt strains 5% of the time with bowel movements. Colon resection due to diverticulitis . Pt has to take Miralax and Colace to have bowel movements which now occurs 1-2 x day with Type 4 stool consistency. Pt has not had an infection from diverticulitis for 1.5 years.    3 )  R shoulder pain - 5/10 pain occurs inconsistently. The past 2 weeks, she notices it hurts putting hand behind back and raising arm . Radiates with neck to acromium ( pt points) .  Pt and husband lift weights on Youtube but stopped due to shoulder pain.    4)  R sciatic pain- occurs 4-5/10 pain with sitting for  4-5 hours, standing > 30 min , radiates to side of thigh to knee   5) Feet pain:  Hx  broken bones in R foot 2024, L 2023.  Pt declined wearing a boot and surgery.  Feet pain occurs after walking 2 miles and she wakes up with an ache in B foot.  Pt has to wear a sleeve on a foot all the time when exercising and walking but in the house.  Pt walks barefoot in the house. Pt wears orthopedic inserts in her tennis shoes.   PERTINENT HISTORY:  Osteoporosis and gets Tx every 6 months  Colon resection due to diverticulitis  Abdominal surgeries for multiple ovarian cyst removals, hysterectomy, bladder tack One vaginal delivery with episiotomy  Heart attack 2016 with nitro/ still takes medication     Walks with husband 1-2 miles everyday , workout:  crunches/ sit ups    PAIN:  Are you having pain? Yes: see above   PRECAUTIONS: No  WEIGHT BEARING RESTRICTIONS:  No   FALLS:  Has patient fallen in last 6 months? Yes because of the ringing to her ears which affected her balance. Had ringing in her ears for 22+  years  LIVING ENVIRONMENT: Lives with: husband  Lives in: two story house  Stairs: a flight  Has following equipment at home:   OCCUPATION: retired Psychologist, counselling   PLOF: IND   PATIENT GOALS:  Improve pain and walking with more balance   OBJECTIVE:   [facility] PT Assessment - 08/22/23 0941       Observation/Other Assessments   Observations ankles crossed      Strength   Overall Strength Comments RLE 3/5, L 5/5  LE   Pain with shoulder flexion/ ext on RUE but strength bilaterally, plan to assess more movements      Ambulation/Gait   Gait Comments 1.3 m/s , excessive sway to R pelvis, minimal armswings             [facility] Adult PT Treatment/Exercise - 08/29/23 1105       Therapeutic Activites    Other Therapeutic Activities explained principles for modifying her self selected routine to minimzie shoudler pain and worsening of prolapse, simulated her home program and  made  modifications. Discussed with useing portable cycler to adjust sitting upright and not in posterior tilt of pelvis and on tailbone      Neuro Re-ed    Neuro Re-ed Details  cued for more cervicoscapular stabilization in her self selected home workout routine, cued for logrolling, cued for proper alignment for feet propioception               HOME EXERCISE PROGRAM: See pt instruction section    ASSESSMENT:  CLINICAL IMPRESSION:   Explained principles for modifying her self selected routine to minimzie shoudler pain and worsening of prolapse, simulated her home program and made modifications. Discussed with useing portable cycler to adjust sitting upright and not in posterior tilt of pelvis and on tailbone.  Cued for more cervicoscapular stabilization in her self selected home workout routine, cued for logrolling, cued for proper alignment for feet propioception  Advised pt to withhold planks, inclined pushups, weights for now given R shoulder pain. Plan to continue addressing R shoulder . Today's simulations of her self selected workout program showed shoulder IR through many movements and poor propioception for stabilization in addition to posterior tilt of pelvis which contributes to prolapse and R shoulder pain. Pt demo'd modifications with improved alignment and technique post training.    Plan to further assess BUE and feet / LKC at upcoming sessions.   Whole person and structural alignment corrections will help promote optimize IAP system for improved pelvic floor function, trunk stability, gait, balance, stabilization with mobility tasks, minimize sciatica, shoulder, low back pain.    Plan to address pelvic floor issues after shoulder, LBP, feet pain been addressed to yield better longer lasting outcomes.  Pt benefits from skilled PT.    OBJECTIVE IMPAIRMENTS decreased activity tolerance, decreased coordination, decreased endurance, decreased mobility, difficulty walking,  decreased ROM, decreased strength, decreased safety awareness, hypomobility, increased muscle spasms, impaired flexibility, improper body mechanics, postural dysfunction, and pain. scar restrictions   ACTIVITY LIMITATIONS  self-care,  home chores, work tasks    PARTICIPATION LIMITATIONS:  community, hobbies activities    PERSONAL FACTORS        are also affecting patient's functional outcome.    REHAB POTENTIAL: Good   CLINICAL DECISION MAKING: Evolving/moderate complexity   EVALUATION COMPLEXITY: Moderate    PATIENT EDUCATION:    Education details: Showed pt anatomy images. Explained muscles attachments/ connection, physiology of deep core system/ spinal- thoracic-pelvis-lower kinetic chain as they relate to pt's presentation, Sx, and past Hx. Explained what and how these areas of deficits need to be restored to balance and function    See Therapeutic activity / neuromuscular re-education section  Answered pt's questions.   Person educated: Patient Education method: Explanation, Demonstration, Tactile cues, Verbal cues, and Handouts Education comprehension: verbalized understanding, returned demonstration, verbal cues required, tactile cues required, and needs further education     PLAN: PT FREQUENCY: 1x/week   PT DURATION: 10 weeks   PLANNED INTERVENTIONS: Therapeutic exercises, Therapeutic activity, Neuromuscular re-education, Balance training, Gait training, Patient/Family education, Self Care, Joint mobilization, Spinal mobilization, Moist heat, Taping, and Manual therapy, dry needling.   PLAN FOR NEXT SESSION: See clinical impression for plan     GOALS: Goals reviewed with patient? Yes  SHORT TERM GOALS: Target date: 09/19/2023    Pt will demo IND with HEP                    Baseline: Not IND  Goal status: INITIAL   LONG TERM GOALS: Target date: 10/31/2023    1.Pt will demo proper deep core coordination without chest breathing and optimal excursion  of diaphragm/pelvic floor in order to promote spinal stability and pelvic floor function  Baseline: dyscoordination Goal status: INITIAL  2.  Pt will demo > 5 pt change on FOTO  to improve QOL and function  PFDI Urinary baseline - 25 Lower score = better function   Pelvic Pain baseline - 42 Lower score = better function   PFDI Bowel - 13 Prolapse - 33 Higher score = better function  Lumber baseline  - 54  Higher score = better function   Goal status: INITIAL  3.  Pt will demo proper body mechanics in against gravity tasks and ADLs  work tasks, fitness  to minimize straining pelvic floor / back    Baseline: not IND, improper form that places strain on pelvic floor  Goal status: INITIAL    4. Pt will demo increased gait speed > 1.5 m/s with reciprocal gait pattern, longer stride length  in order to ambulate safely in community and return to fitness routine  Baseline: 1.3 m/s , excessive sway to R pelvis, minimal armswings  Goal status: INITIAL    5. Pt will demo no pain with  putting hand behind back and raising arm and  radiating pain at acromium in order to return to lifting weights to minimize osteoporosis worsening  Baseline:  Shoulder 5/10 pain occurs inconsistently. The past 2 weeks, she notices it hurts putting hand behind back and raising arm . Radiates with neck to acromium ( pt points)  Goal status: INITIAL   6. Pt will report no sciatic pain across 2 weeks , sitting for 4-5 hours one trip, standing > 30 min with no radiating pain , < 2/10  Baseline: R sciatica occurs 4-5/10 pain with sitting for 4-5 hours, standing > 30 min , radiates to side of thigh to knee  Goal status: INITIAL  7. Pt will demo levelled pelvic girdle and shoulder height in order to progress to deep core strengthening HEP and restore mobility at spine, pelvis, gait, posture minimize falls, and improve balance   Baseline: L shoulder, R iliac crest lowered Goal Status:   INITIAL    Mariane Masters, PT 08/22/2023, 9:48 AM

## 2023-09-05 ENCOUNTER — Ambulatory Visit: Payer: Medicare Other | Admitting: Physical Therapy

## 2023-09-05 DIAGNOSIS — R278 Other lack of coordination: Secondary | ICD-10-CM

## 2023-09-05 DIAGNOSIS — R2689 Other abnormalities of gait and mobility: Secondary | ICD-10-CM

## 2023-09-05 DIAGNOSIS — R293 Abnormal posture: Secondary | ICD-10-CM

## 2023-09-05 NOTE — Patient Instructions (Signed)
   Lengthen Back rib by R  shoulder   ( winging)    Lie on L  side , pillow between knees and under head  Pull  R arm overhead over mattress, grab the edge of mattress,pull it upward, drawing elbow away from ears  Breathing 10 reps  Brushing arm with 3/4 turn onto pillow behind back  Lying on L  side ,Pillow/ Block between knees     dragging top forearm across ribs below breast rotating 3/4 turn,  rotating  _R_ only this week ,  relax onto the pillow behind the back  and then back to other palm , maintain top palm on body whole top and not lift shoulder  Do this side this week       Wait do both sides until we have levelled out your spine and shoulders  __  Place band in "U"   TRY THE RED BAND TO MAKE SURE NO soreness at shoulder   band under ballmounds  while laying on back w/ knees bent   Lying on back, knees bent    band under ballmounds  while laying on back w/ knees bent  "W" exercise  10 reps x 2 sets   Band is placed under feet, knees bent, feet are hip width apart Hold band with thumbs point out, keep upper arm and elbow touching the bed the whole time  - inhale and then exhale pull bands by bending elbows hands move in a "w"  (feel shoulder blades squeezing)   ________________

## 2023-09-05 NOTE — Therapy (Signed)
OUTPATIENT PHYSICAL THERAPY TREATMENT      Patient Name: Debra Moody MRN: [id number] DOB:03/09/1941, 83 y.o., female Today's Date: 09/05/2023   PT End of Session - 09/05/23 0939     Visit Number 3    Number of Visits 10    Date for PT Re-Evaluation 10/31/23    PT Start Time 0940    PT Stop Time 1018    PT Time Calculation (min) 38 min    Activity Tolerance Patient tolerated treatment well;No increased pain    Behavior During Therapy WFL for tasks assessed/performed             Past Medical History:  Diagnosis Date   Actinic keratosis 11/09/2008   L mid dorsum lat forearm - bx proven   Basal cell carcinoma 11/11/2007   R top of shoulder   Basal cell carcinoma 11/24/2013   L sup med scapula   Basal cell carcinoma 12/22/2013   Mid back spinal    Complication of anesthesia    Gas at dentist office "made me feel like I was going to die".   Diverticulitis    Dysplastic nevus    left forearm   Family history of adverse reaction to anesthesia    Mother - Rash   GERD (gastroesophageal reflux disease)    History of shingles 2015   Hyperlipidemia    Hypertension    MI (myocardial infarction) (HCC) 11/03/2014   Severe damage to the left ventricle    Orthostatic hypotension    Osteoporosis    bone density test done 2019   Skin cancer, basal cell    Past Surgical History:  Procedure Laterality Date   ABDOMINAL HYSTERECTOMY     bladder tack     CARDIAC CATHETERIZATION  11/03/2014   CATARACT EXTRACTION W/PHACO Left 09/20/2021   Procedure: CATARACT EXTRACTION PHACO AND INTRAOCULAR LENS PLACEMENT (IOC) LEFT 5.68 00:56.7;  Surgeon: Lockie Mola, MD;  Location: [facility];  Service: Ophthalmology;  Laterality: Left;   COLONOSCOPY WITH PROPOFOL N/A 07/01/2017   Procedure: COLONOSCOPY WITH PROPOFOL;  Surgeon: Scot Jun, MD;  Location: [facility] ENDOSCOPY;  Service: Endoscopy;  Laterality: N/A;   GUM SURGERY     Patient Active Problem List   Diagnosis  Date Noted   Shortness of breath 10/22/2017   Coronary artery disease involving native coronary artery of native heart without angina pectoris 09/05/2016   Orthostatic hypotension 03/07/2015   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 It is getting better. Pt has been doing her modification to her workout.   SUBJECTIVE STATEMENT ON EVAL 08/22/23 : 1) pelvic pain:   mm spasms which caused pt to not be able to sit and she had burning sensation outside of vagina and pain with sexual intercourse.  Pt is taking premarin cream as needed. Pt is not having the spasms since taking premarin. Pt also uses jet stream in her tub with epsom salts.   2) incomplete urination occurs 60% of the time . Has no issues starting urine stream . Pt strains 5% of the time with bowel movements. Colon resection due to diverticulitis . Pt has to take Miralax and Colace to have bowel movements which now occurs 1-2 x day with Type 4 stool consistency. Pt has not had an infection from diverticulitis for 1.5 years.    3 )  R shoulder pain - 5/10 pain occurs inconsistently. The past 2 weeks, she notices it hurts putting hand behind back and raising arm . Radiates with neck to acromium ( pt points) .  Pt and husband lift weights on Youtube but stopped due to shoulder pain.    4)  R sciatic  pain- occurs 4-5/10 pain with sitting for 4-5 hours, standing > 30 min , radiates to side of thigh to knee   5) Feet pain:  Hx  broken bones in R foot 2024, L 2023.  Pt declined wearing a boot and surgery.  Feet pain occurs after walking 2 miles and she wakes up with an ache in B foot.  Pt has to wear a sleeve on a foot all the time when exercising and walking but in the house.  Pt walks barefoot in the house. Pt wears orthopedic inserts in her tennis shoes.   PERTINENT HISTORY:  Osteoporosis and gets Tx every 6 months  Colon resection due to diverticulitis  Abdominal surgeries for multiple ovarian cyst removals, hysterectomy, bladder tack One vaginal delivery with episiotomy  Heart attack 2016 with nitro/ still takes medication     Walks with husband 1-2 miles everyday , workout:  crunches/ sit ups    PAIN:  Are you having pain? Yes: see above   PRECAUTIONS: No  WEIGHT BEARING RESTRICTIONS:  No   FALLS:  Has patient fallen in last 6 months? Yes because of the ringing to her ears which affected her balance. Had ringing in her ears for 22+  years  LIVING ENVIRONMENT: Lives with: husband  Lives in: two story house  Stairs: a flight  Has following equipment at home:   OCCUPATION: retired Psychologist, counselling   PLOF: IND   PATIENT GOALS:  Improve pain and walking with more balance   OBJECTIVE:   [facility] PT Assessment - 09/05/23 1544       Coordination   Coordination and Movement Description L scapular winging with shoulder abduction,      Palpation   Spinal mobility deviated T 7-10 , intercostal tightness L , protracted R shoulder      Bed Mobility   Bed Mobility --   poor carry over with log rolling              [facility] Adult PT Treatment/Exercise - 09/05/23 1403       Therapeutic Activites    Other Therapeutic Activities cued for log rolling in and out of bed      Neuro Re-ed    Neuro  Re-ed Details  cued for stretches to realign thoracic segments and realign  shoulders   Cued for resistance band stabilization HEP      Modalities   Modalities Moist Heat      Moist Heat Therapy   Number Minutes Moist Heat 5 Minutes    Moist Heat Location --   thoracic ( during instruction for new HEP     Manual Therapy   Manual therapy comments STM/MWM at T7-10 to realign, intercostals to minimize torsion of ribcage, realign shoulders,               HOME EXERCISE PROGRAM: See pt instruction section    ASSESSMENT:  CLINICAL IMPRESSION:   Addressed deviations at thoracic segments which related to R shoulder pain. Manual Tx was applied to realign segments which will help with deep core system for postural stability , minimizing shoulder pain and LBP, and optimizing pelvic functions    Added cervicoscapular stabilization with resistance band. Downgraded to red band from green band due to pinpointed soreness but no pain at R shoulder. Cued for proper technique.     Plan to further assess BUE and feet / LKC at upcoming sessions.   Whole person and structural alignment corrections will help promote optimize IAP system for improved pelvic floor function, trunk stability, gait, balance, stabilization with mobility tasks, minimize sciatica, shoulder, low back pain.    Plan to address pelvic floor issues after shoulder, LBP, feet pain been addressed to yield better longer lasting outcomes.  Pt benefits from skilled PT.    OBJECTIVE IMPAIRMENTS decreased activity tolerance, decreased coordination, decreased endurance, decreased mobility, difficulty walking, decreased ROM, decreased strength, decreased safety awareness, hypomobility, increased muscle spasms, impaired flexibility, improper body mechanics, postural dysfunction, and pain. scar restrictions   ACTIVITY LIMITATIONS  self-care,  home chores, work tasks    PARTICIPATION LIMITATIONS:  community, hobbies activities    PERSONAL FACTORS        are also affecting patient's functional outcome.     REHAB POTENTIAL: Good   CLINICAL DECISION MAKING: Evolving/moderate complexity   EVALUATION COMPLEXITY: Moderate    PATIENT EDUCATION:    Education details: Showed pt anatomy images. Explained muscles attachments/ connection, physiology of deep core system/ spinal- thoracic-pelvis-lower kinetic chain as they relate to pt's presentation, Sx, and past Hx. Explained what and how these areas of deficits need to be restored to balance and function    See Therapeutic activity / neuromuscular re-education section  Answered pt's questions.   Person educated: Patient Education method: Explanation, Demonstration, Tactile cues, Verbal cues, and Handouts Education comprehension: verbalized understanding, returned demonstration, verbal cues required, tactile cues required, and needs further education     PLAN: PT FREQUENCY: 1x/week   PT DURATION: 10 weeks   PLANNED INTERVENTIONS: Therapeutic exercises, Therapeutic activity, Neuromuscular re-education, Balance training, Gait training, Patient/Family education, Self Care, Joint mobilization, Spinal mobilization, Moist heat, Taping, and Manual therapy, dry needling.   PLAN FOR NEXT SESSION: See clinical impression for plan     GOALS: Goals reviewed with patient? Yes  SHORT TERM GOALS: Target date: 09/19/2023    Pt will demo IND with HEP                    Baseline: Not IND            Goal status: INITIAL   LONG TERM GOALS: Target date: 10/31/2023    1.Pt will demo proper deep core coordination without chest breathing and optimal excursion of diaphragm/pelvic  floor in order to promote spinal stability and pelvic floor function  Baseline: dyscoordination Goal status: INITIAL  2.  Pt will demo > 5 pt change on FOTO  to improve QOL and function  PFDI Urinary baseline - 25 Lower score = better function   Pelvic Pain baseline - 42 Lower score = better function   PFDI Bowel - 13 Prolapse - 33 Higher score = better  function  Lumber baseline  - 54  Higher score = better function   Goal status: INITIAL  3.  Pt will demo proper body mechanics in against gravity tasks and ADLs  work tasks, fitness  to minimize straining pelvic floor / back    Baseline: not IND, improper form that places strain on pelvic floor  Goal status: INITIAL    4. Pt will demo increased gait speed > 1.5 m/s with reciprocal gait pattern, longer stride length  in order to ambulate safely in community and return to fitness routine  Baseline: 1.3 m/s , excessive sway to R pelvis, minimal armswings  Goal status: INITIAL    5. Pt will demo no pain with  putting hand behind back and raising arm and  radiating pain at acromium in order to return to lifting weights to minimize osteoporosis worsening  Baseline:  Shoulder 5/10 pain occurs inconsistently. The past 2 weeks, she notices it hurts putting hand behind back and raising arm . Radiates with neck to acromium ( pt points)  Goal status: INITIAL   6. Pt will report no sciatic pain across 2 weeks , sitting for 4-5 hours one trip, standing > 30 min with no radiating pain , < 2/10  Baseline: R sciatica occurs 4-5/10 pain with sitting for 4-5 hours, standing > 30 min , radiates to side of thigh to knee  Goal status: INITIAL  7. Pt will demo levelled pelvic girdle and shoulder height in order to progress to deep core strengthening HEP and restore mobility at spine, pelvis, gait, posture minimize falls, and improve balance   Baseline: L shoulder, R iliac crest lowered Goal Status:  INITIAL    Mariane Masters, PT 08/22/2023, 9:48 AM

## 2023-09-12 ENCOUNTER — Ambulatory Visit: Payer: Medicare Other | Admitting: Physical Therapy

## 2023-09-12 DIAGNOSIS — R293 Abnormal posture: Secondary | ICD-10-CM

## 2023-09-12 DIAGNOSIS — R278 Other lack of coordination: Secondary | ICD-10-CM

## 2023-09-12 DIAGNOSIS — R2689 Other abnormalities of gait and mobility: Secondary | ICD-10-CM

## 2023-09-12 NOTE — Patient Instructions (Addendum)
Strengthening feet arches:    Feet slides :   Points of contact at sitting bones  Four points of contact of foot,  Side knee back while keeping knee out along 2-3rd toe line   Heel up, ankle not twist out Lower heel while keeping knee out along 2-3rd toe line Four points of contact of foot, Slide foot back while keeping knee out along 2-3rd toe line   Repeated with other foot    2 min  __  No locking knees in standing from sit to stand, more pressure on ballmounds

## 2023-09-12 NOTE — Therapy (Signed)
OUTPATIENT PHYSICAL THERAPY TREATMENT      Patient Name: Debra Moody MRN: [id number] DOB:04-06-1941, 83 y.o., female Today's Date: 09/12/2023   PT End of Session - 09/12/23 0947     Visit Number 4    Number of Visits 10    Date for PT Re-Evaluation 10/31/23    PT Start Time 0940    PT Stop Time 1020    PT Time Calculation (min) 40 min    Activity Tolerance Patient tolerated treatment well;No increased pain    Behavior During Therapy WFL for tasks assessed/performed             Past Medical History:  Diagnosis Date   Actinic keratosis 11/09/2008   L mid dorsum lat forearm - bx proven   Basal cell carcinoma 11/11/2007   R top of shoulder   Basal cell carcinoma 11/24/2013   L sup med scapula   Basal cell carcinoma 12/22/2013   Mid back spinal    Complication of anesthesia    Gas at dentist office "made me feel like I was going to die".   Diverticulitis    Dysplastic nevus    left forearm   Family history of adverse reaction to anesthesia    Mother - Rash   GERD (gastroesophageal reflux disease)    History of shingles 2015   Hyperlipidemia    Hypertension    MI (myocardial infarction) (HCC) 11/03/2014   Severe damage to the left ventricle    Orthostatic hypotension    Osteoporosis    bone density test done 2019   Skin cancer, basal cell    Past Surgical History:  Procedure Laterality Date   ABDOMINAL HYSTERECTOMY     bladder tack     CARDIAC CATHETERIZATION  11/03/2014   CATARACT EXTRACTION W/PHACO Left 09/20/2021   Procedure: CATARACT EXTRACTION PHACO AND INTRAOCULAR LENS PLACEMENT (IOC) LEFT 5.68 00:56.7;  Surgeon: Lockie Mola, MD;  Location: [facility];  Service: Ophthalmology;  Laterality: Left;   COLONOSCOPY WITH PROPOFOL N/A 07/01/2017   Procedure: COLONOSCOPY WITH PROPOFOL;  Surgeon: Scot Jun, MD;  Location: [facility];  Service: Endoscopy;  Laterality: N/A;   GUM SURGERY     Patient Active Problem List   Diagnosis  Date Noted   Shortness of breath 10/22/2017   Coronary artery disease involving native coronary artery of native heart without angina pectoris 09/05/2016   Orthostatic hypotension 03/07/2015   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   Anxiety 12/01/2014   Takotsubo syndrome 12/01/2014   Hyperlipidemia 12/01/2014    PCP: Bethann Punches MD   REFERRING PROVIDER: Christeen Douglas MD   REFERRING DIAG: Myagia   Rationale for Evaluation and Treatment Rehabilitation  THERAPY DIAG:  Other abnormalities of gait and mobility  Other lack of coordination  Abnormal posture  ONSET DATE:   SUBJECTIVE:  SUBJECTIVE STATEMENT TODAY:  Pt had no complaints with last session on the shoulder  SUBJECTIVE STATEMENT ON EVAL 08/22/23 : 1) pelvic pain:   mm spasms which caused pt to not be able to sit and she had burning sensation outside of vagina and pain with sexual intercourse.  Pt is taking premarin cream as needed. Pt is not having the spasms since taking premarin. Pt also uses jet stream in her tub with epsom salts.   2) incomplete urination occurs 60% of the time . Has no issues starting urine stream . Pt strains 5% of the time with bowel movements. Colon resection due to diverticulitis . Pt has to take Miralax and Colace to have bowel movements which now occurs 1-2 x day with Type 4 stool consistency. Pt has not had an infection from diverticulitis for 1.5 years.    3 )  R shoulder pain - 5/10 pain occurs inconsistently. The past 2 weeks, she notices it hurts putting hand behind back and raising arm . Radiates with neck to acromium ( pt points) .  Pt and husband lift weights on Youtube but stopped due to shoulder pain.    4)  R sciatic pain- occurs 4-5/10 pain with sitting for 4-5 hours, standing > 30 min ,  radiates to side of thigh to knee   5) Feet pain:  Hx  broken bones in R foot 2024, L 2023.  Pt declined wearing a boot and surgery.  Feet pain occurs after walking 2 miles and she wakes up with an ache in B foot.  Pt has to wear a sleeve on a foot all the time when exercising and walking but in the house.  Pt walks barefoot in the house. Pt wears orthopedic inserts in her tennis shoes.   PERTINENT HISTORY:  Osteoporosis and gets Tx every 6 months  Colon resection due to diverticulitis  Abdominal surgeries for multiple ovarian cyst removals, hysterectomy, bladder tack One vaginal delivery with episiotomy  Heart attack 2016 with nitro/ still takes medication     Walks with husband 1-2 miles everyday , workout:  crunches/ sit ups    PAIN:  Are you having pain? Yes: see above   PRECAUTIONS: No  WEIGHT BEARING RESTRICTIONS:  No   FALLS:  Has patient fallen in last 6 months? Yes because of the ringing to her ears which affected her balance. Had ringing in her ears for 22+  years  LIVING ENVIRONMENT: Lives with: husband  Lives in: two story house  Stairs: a flight  Has following equipment at home:   OCCUPATION: retired Psychologist, counselling   PLOF: IND   PATIENT GOALS:  Improve pain and walking with more balance   OBJECTIVE:   [facility] PT Assessment - 09/12/23 0949       AROM   Overall AROM Comments L DF 90deg, R 85 deg,      Strength   Overall Strength Comments R big toe, 4 digits, DF/EV : 3/5, L 5/5      Palpation   Palpation comment tightness along ant dig/ ext dig, hypomobile tib fib, midfoot joints, plantar fascia L      Transfers   Five time sit to stand comments  16.01 sec no UE support, hyperextended knees, posteiror COM with rise               [facility] Adult PT Treatment/Exercise - 09/12/23 0949       Therapeutic Activites    Other Therapeutic Activities explained sit ot stand mechanics.  Neuro Re-ed    Neuro Re-ed Details  excessive cues for minimize  hyperextended knees, decrease heel WBing in sit to stand      Manual Therapy   Manual therapy comments distraction, STM/MWM at problem areas noted in assessment to promote DF/EV, decrease plantar fascia tightness                 HOME EXERCISE PROGRAM: See pt instruction section    ASSESSMENT:  CLINICAL IMPRESSION:    Pt showed good carry over with shoulder alignment from last session.   Focused on LKC today on LLE to improve DF AROM and mid foot mobility. Plan to address R foot at next session. Addressing feet mobility is helping with achieving DF/EV in sit to stand.  Excessive cues for minimize hyperextended knees, decrease heel WBing in sit to stand   Whole person and structural alignment corrections will help promote optimize IAP system for improved pelvic floor function, trunk stability, gait, balance, stabilization with mobility tasks, minimize sciatica, shoulder, low back pain.    Plan to address pelvic floor issues after shoulder, LBP, feet pain been addressed to yield better longer lasting outcomes.  Pt benefits from skilled PT.    OBJECTIVE IMPAIRMENTS decreased activity tolerance, decreased coordination, decreased endurance, decreased mobility, difficulty walking, decreased ROM, decreased strength, decreased safety awareness, hypomobility, increased muscle spasms, impaired flexibility, improper body mechanics, postural dysfunction, and pain. scar restrictions   ACTIVITY LIMITATIONS  self-care,  home chores, work tasks    PARTICIPATION LIMITATIONS:  community, hobbies activities    PERSONAL FACTORS        are also affecting patient's functional outcome.    REHAB POTENTIAL: Good   CLINICAL DECISION MAKING: Evolving/moderate complexity   EVALUATION COMPLEXITY: Moderate    PATIENT EDUCATION:    Education details: Showed pt anatomy images. Explained muscles attachments/ connection, physiology of deep core system/ spinal- thoracic-pelvis-lower kinetic chain as  they relate to pt's presentation, Sx, and past Hx. Explained what and how these areas of deficits need to be restored to balance and function    See Therapeutic activity / neuromuscular re-education section  Answered pt's questions.   Person educated: Patient Education method: Explanation, Demonstration, Tactile cues, Verbal cues, and Handouts Education comprehension: verbalized understanding, returned demonstration, verbal cues required, tactile cues required, and needs further education     PLAN: PT FREQUENCY: 1x/week   PT DURATION: 10 weeks   PLANNED INTERVENTIONS: Therapeutic exercises, Therapeutic activity, Neuromuscular re-education, Balance training, Gait training, Patient/Family education, Self Care, Joint mobilization, Spinal mobilization, Moist heat, Taping, and Manual therapy, dry needling.   PLAN FOR NEXT SESSION: See clinical impression for plan     GOALS: Goals reviewed with patient? Yes  SHORT TERM GOALS: Target date: 09/19/2023    Pt will demo IND with HEP                    Baseline: Not IND            Goal status: INITIAL   LONG TERM GOALS: Target date: 10/31/2023    1.Pt will demo proper deep core coordination without chest breathing and optimal excursion of diaphragm/pelvic floor in order to promote spinal stability and pelvic floor function  Baseline: dyscoordination Goal status: INITIAL  2.  Pt will demo > 5 pt change on FOTO  to improve QOL and function  PFDI Urinary baseline - 25 Lower score = better function   Pelvic Pain baseline - 42 Lower score = better function  PFDI Bowel - 13 Prolapse - 33 Higher score = better function  Lumber baseline  - 54  Higher score = better function   Goal status: INITIAL  3.  Pt will demo proper body mechanics in against gravity tasks and ADLs  work tasks, fitness  to minimize straining pelvic floor / back    Baseline: not IND, improper form that places strain on pelvic floor  Goal status:  INITIAL    4. Pt will demo increased gait speed > 1.5 m/s with reciprocal gait pattern, longer stride length  in order to ambulate safely in community and return to fitness routine  Baseline: 1.3 m/s , excessive sway to R pelvis, minimal armswings  Goal status: INITIAL    5. Pt will demo no pain with  putting hand behind back and raising arm and  radiating pain at acromium in order to return to lifting weights to minimize osteoporosis worsening  Baseline:  Shoulder 5/10 pain occurs inconsistently. The past 2 weeks, she notices it hurts putting hand behind back and raising arm . Radiates with neck to acromium ( pt points)  Goal status: INITIAL   6. Pt will report no sciatic pain across 2 weeks , sitting for 4-5 hours one trip, standing > 30 min with no radiating pain , < 2/10  Baseline: R sciatica occurs 4-5/10 pain with sitting for 4-5 hours, standing > 30 min , radiates to side of thigh to knee  Goal status: INITIAL  7. Pt will demo levelled pelvic girdle and shoulder height in order to progress to deep core strengthening HEP and restore mobility at spine, pelvis, gait, posture minimize falls, and improve balance   Baseline: L shoulder, R iliac crest lowered Goal Status:  INITIAL  8. Decrease FTST by <5 sec  Baseline:16.01 sec no UE support  Goal Status: INITIAL    Mariane Masters, PT 08/22/2023, 9:48 AM

## 2023-09-13 ENCOUNTER — Other Ambulatory Visit: Payer: Self-pay | Admitting: Cardiovascular Disease

## 2023-09-19 ENCOUNTER — Ambulatory Visit: Payer: Medicare Other | Attending: Obstetrics and Gynecology | Admitting: Physical Therapy

## 2023-09-19 DIAGNOSIS — R293 Abnormal posture: Secondary | ICD-10-CM | POA: Insufficient documentation

## 2023-09-19 DIAGNOSIS — R278 Other lack of coordination: Secondary | ICD-10-CM | POA: Diagnosis present

## 2023-09-19 DIAGNOSIS — R2689 Other abnormalities of gait and mobility: Secondary | ICD-10-CM | POA: Diagnosis present

## 2023-09-19 NOTE — Therapy (Signed)
 OUTPATIENT PHYSICAL THERAPY TREATMENT      Patient Name: Debra Moody MRN: [id number] DOB:1940-11-19, 83 y.o., female Today's Date: 09/19/2023   PT End of Session - 09/19/23 1025     Visit Number 5    Number of Visits 10    Date for PT Re-Evaluation 10/31/23    PT Start Time 0940    PT Stop Time 1025    PT Time Calculation (min) 45 min    Activity Tolerance Patient tolerated treatment well;No increased pain    Behavior During Therapy WFL for tasks assessed/performed             Past Medical History:  Diagnosis Date   Actinic keratosis 11/09/2008   L mid dorsum lat forearm - bx proven   Basal cell carcinoma 11/11/2007   R top of shoulder   Basal cell carcinoma 11/24/2013   L sup med scapula   Basal cell carcinoma 12/22/2013   Mid back spinal    Complication of anesthesia    Gas at dentist office made me feel like I was going to die.   Diverticulitis    Dysplastic nevus    left forearm   Family history of adverse reaction to anesthesia    Mother - Rash   GERD (gastroesophageal reflux disease)    History of shingles 2015   Hyperlipidemia    Hypertension    MI (myocardial infarction) (HCC) 11/03/2014   Severe damage to the left ventricle    Orthostatic hypotension    Osteoporosis    bone density test done 2019   Skin cancer, basal cell    Past Surgical History:  Procedure Laterality Date   ABDOMINAL HYSTERECTOMY     bladder tack     CARDIAC CATHETERIZATION  11/03/2014   CATARACT EXTRACTION W/PHACO Left 09/20/2021   Procedure: CATARACT EXTRACTION PHACO AND INTRAOCULAR LENS PLACEMENT (IOC) LEFT 5.68 00:56.7;  Surgeon: Mittie Gaskin, MD;  Location: [facility];  Service: Ophthalmology;  Laterality: Left;   COLONOSCOPY WITH PROPOFOL  N/A 07/01/2017   Procedure: COLONOSCOPY WITH PROPOFOL ;  Surgeon: Viktoria Lamar DASEN, MD;  Location: [facility];  Service: Endoscopy;  Laterality: N/A;   GUM SURGERY     Patient Active Problem List   Diagnosis  Date Noted   Shortness of breath 10/22/2017   Coronary artery disease involving native coronary artery of native heart without angina pectoris 09/05/2016   Orthostatic hypotension 03/07/2015   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 She is doing her band exercises on the floor because her husband is still sleep in bed.   Pt has been wearing her orthotics for one year. Pt was about to order more for all her shoes. Pt was also going to buy new shoes. Pt used to wear high heels and flip flops all the time.   SUBJECTIVE STATEMENT ON EVAL 08/22/23 : 1) pelvic pain:   mm spasms which caused pt to not be able to sit and she had burning sensation outside of vagina and pain with sexual intercourse.  Pt is taking premarin cream as needed. Pt is not having the spasms since taking premarin. Pt also uses jet stream in her tub with epsom salts.   2) incomplete urination occurs 60% of the time . Has no issues starting urine stream . Pt strains 5% of the time with bowel movements. Colon resection due to diverticulitis . Pt has to take Miralax and Colace to have bowel movements which now occurs 1-2 x day with Type 4 stool consistency. Pt has not had an infection from diverticulitis for 1.5 years.    3 )  R shoulder pain - 5/10 pain occurs inconsistently. The past 2  weeks, she notices it hurts putting hand behind back and raising arm . Radiates with neck to acromium ( pt points) .  Pt and husband lift weights on Youtube but stopped due to shoulder pain.    4)  R sciatic pain- occurs 4-5/10 pain with sitting for 4-5 hours, standing > 30 min , radiates to side of thigh to knee   5) Feet pain:  Hx  broken bones in R foot 2024, L 2023.  Pt declined wearing a boot and surgery.  Feet pain occurs after walking 2 miles and she wakes up with an ache in B foot.  Pt has to wear a sleeve on a foot all the time when exercising and walking but in the house.  Pt walks barefoot in the house. Pt wears orthopedic inserts in her tennis shoes.   PERTINENT HISTORY:  Osteoporosis and gets Tx every 6 months  Colon resection due to diverticulitis  Abdominal surgeries for multiple ovarian cyst removals, hysterectomy, bladder tack One vaginal delivery with episiotomy  Heart attack 2016 with nitro/ still takes medication     Walks with husband 1-2 miles everyday , workout:  crunches/ sit ups    PAIN:  Are you having pain? Yes: see above   PRECAUTIONS: No  WEIGHT BEARING RESTRICTIONS:  No   FALLS:  Has patient fallen in last 6 months? Yes because of the ringing to her ears which affected her balance. Had ringing in her ears for 22+  years  LIVING ENVIRONMENT: Lives with: husband  Lives in: two story house  Stairs: a flight  Has following equipment at home:   OCCUPATION: retired psychologist, counselling   PLOF: IND   PATIENT GOALS:  Improve pain and walking with more balance   OBJECTIVE:     [facility] PT Assessment - 09/19/23 1524       Palpation   Palpation comment tightness along ant dig/ ext dig, hypomobile tib fib, midfoot joints, plantar fascia  B, ( R> L)             [facility] Adult PT Treatment/Exercise - 09/19/23 1522       Self-Care   Self-Care Other Self-Care Comments    Other Self-Care Comments  discussed  discontinuing wearing orthotics which is causing  more stiffness to foot and high arch , adviced propuioceptive inserts to stilmualte nn system somatosensory for balance, discussed avoiding flip flop wearing which she has always worn along with high heels. Provided recommendations to shoes and sandals      Neuro Re-ed    Neuro Re-ed Details  excessive cues for sel massage for B feet to promote feet mobility      Manual Therapy   Manual therapy comments distraction, STM/MWM at problem areas noted in assessment to promote DF/EV, decrease plantar fascia tightness B               HOME EXERCISE PROGRAM: See pt instruction section    ASSESSMENT:  CLINICAL IMPRESSION:    Pt showed good carry over with increased DF/EV with sit to stand mechanics which will help with minimizing straining against gravity task and help with minimizing pelvic pain/ feet pain.    Continued to focuse on LKC today on BLE to improve DF AROM and mid foot mobility,  knee alignment and propioception today.   Given her Hx of wear high heels and flip flops all the time and wearing her orthotics for the past year, pt's high high arches and flexed toes must be addressed to improve feet mobility for pelvic function and minimize falls risk.              Whole person and structural alignment corrections will help promote optimize IAP system for improved pelvic floor function, trunk stability, gait, balance, stabilization with mobility tasks, minimize sciatica, shoulder, low back pain.    Plan to address pelvic floor issues after shoulder, LBP, feet pain been addressed to yield better longer lasting outcomes.  Pt benefits from skilled PT.    OBJECTIVE IMPAIRMENTS decreased activity tolerance, decreased coordination, decreased endurance, decreased mobility, difficulty walking, decreased ROM, decreased strength, decreased safety awareness, hypomobility, increased muscle spasms, impaired flexibility, improper body mechanics, postural dysfunction, and pain. scar  restrictions   ACTIVITY LIMITATIONS  self-care,  home chores, work tasks    PARTICIPATION LIMITATIONS:  community, hobbies activities    PERSONAL FACTORS        are also affecting patient's functional outcome.    REHAB POTENTIAL: Good   CLINICAL DECISION MAKING: Evolving/moderate complexity   EVALUATION COMPLEXITY: Moderate    PATIENT EDUCATION:    Education details: Showed pt anatomy images. Explained muscles attachments/ connection, physiology of deep core system/ spinal- thoracic-pelvis-lower kinetic chain as they relate to pt's presentation, Sx, and past Hx. Explained what and how these areas of deficits need to be restored to balance and function    See Therapeutic activity / neuromuscular re-education section  Answered pt's questions.   Person educated: Patient Education method: Explanation, Demonstration, Tactile cues, Verbal cues, and Handouts Education comprehension: verbalized understanding, returned demonstration, verbal cues required, tactile cues required, and needs further education     PLAN: PT FREQUENCY: 1x/week   PT DURATION: 10 weeks   PLANNED INTERVENTIONS: Therapeutic exercises, Therapeutic activity, Neuromuscular re-education, Balance training, Gait training, Patient/Family education, Self Care, Joint mobilization, Spinal mobilization, Moist heat, Taping, and Manual therapy, dry needling.   PLAN FOR NEXT SESSION: See clinical impression for plan     GOALS: Goals reviewed with patient? Yes  SHORT TERM GOALS: Target date: 09/19/2023    Pt will demo IND with HEP                    Baseline: Not IND  Goal status: INITIAL   LONG TERM GOALS: Target date: 10/31/2023    1.Pt will demo proper deep core coordination without chest breathing and optimal excursion of diaphragm/pelvic floor in order to promote spinal stability and pelvic floor function  Baseline: dyscoordination Goal status: INITIAL  2.  Pt will demo > 5 pt change on FOTO  to  improve QOL and function  PFDI Urinary baseline - 25 Lower score = better function   Pelvic Pain baseline - 42 Lower score = better function   PFDI Bowel - 13 Prolapse - 33 Higher score = better function  Lumber baseline  - 54  Higher score = better function   Goal status: Deferred   3.  Pt will demo proper body mechanics in against gravity tasks and ADLs  work tasks, fitness  to minimize straining pelvic floor / back    Baseline: not IND, improper form that places strain on pelvic floor  Goal status: INITIAL    4. Pt will demo increased gait speed > 1.5 m/s with reciprocal gait pattern, longer stride length  in order to ambulate safely in community and return to fitness routine  Baseline: 1.3 m/s , excessive sway to R pelvis, minimal armswings  Goal status: INITIAL    5. Pt will demo no pain with  putting hand behind back and raising arm and  radiating pain at acromium in order to return to lifting weights to minimize osteoporosis worsening  Baseline:  Shoulder 5/10 pain occurs inconsistently. The past 2 weeks, she notices it hurts putting hand behind back and raising arm . Radiates with neck to acromium ( pt points)  Goal status: INITIAL   6. Pt will report no sciatic pain across 2 weeks , sitting for 4-5 hours one trip, standing > 30 min with no radiating pain , < 2/10  Baseline: R sciatica occurs 4-5/10 pain with sitting for 4-5 hours, standing > 30 min , radiates to side of thigh to knee  Goal status: INITIAL  7. Pt will demo levelled pelvic girdle and shoulder height in order to progress to deep core strengthening HEP and restore mobility at spine, pelvis, gait, posture minimize falls, and improve balance   Baseline: L shoulder, R iliac crest lowered Goal Status:  INITIAL  8. Decrease FTST by <5 sec  Baseline:16.01 sec no UE support  Goal Status: INITIAL    Pia Lupe Plump, PT 08/22/2023, 9:48 AM

## 2023-09-19 NOTE — Patient Instructions (Signed)
 Feet care :  Self -feet massage  ? ?Handshake : fingers between toes, moving ballmounds/toes back and forth several times while other hand anchors at arch. Do the same at the hind/mid foot.  ?Heel to toes upward to a letter Big Letter T strokes to spread ballmounds and toes, several times, pinch between webs of toes  ?Run finger tips along top of foot between long bones "comb between the bones"  ? ? ?Wiggle toes and spread them out when relaxing  ? ?

## 2023-09-26 ENCOUNTER — Ambulatory Visit: Payer: Medicare Other | Admitting: Physical Therapy

## 2023-09-26 DIAGNOSIS — R2689 Other abnormalities of gait and mobility: Secondary | ICD-10-CM | POA: Diagnosis not present

## 2023-09-26 DIAGNOSIS — R293 Abnormal posture: Secondary | ICD-10-CM

## 2023-09-26 DIAGNOSIS — R278 Other lack of coordination: Secondary | ICD-10-CM

## 2023-09-26 NOTE — Therapy (Signed)
OUTPATIENT PHYSICAL THERAPY TREATMENT      Patient Name: Debra Moody MRN: [id number] DOB:[birth year], 83 y.o., female Today's Date: 09/26/2023   PT End of Session - 09/26/23 0944     Visit Number 6    Number of Visits 10    Date for PT Re-Evaluation 10/31/23    PT Start Time 0945    PT Stop Time 1023    PT Time Calculation (min) 38 min    Activity Tolerance Patient tolerated treatment well;No increased pain    Behavior During Therapy WFL for tasks assessed/performed             Past Medical History:  Diagnosis Date   Actinic keratosis 11/09/2008   L mid dorsum lat forearm - bx proven   Basal cell carcinoma 11/11/2007   R top of shoulder   Basal cell carcinoma 11/24/2013   L sup med scapula   Basal cell carcinoma 12/22/2013   Mid back spinal    Complication of anesthesia    Gas at dentist office "made me feel like I was going to die".   Diverticulitis    Dysplastic nevus    left forearm   Family history of adverse reaction to anesthesia    Mother - Rash   GERD (gastroesophageal reflux disease)    History of shingles 2015   Hyperlipidemia    Hypertension    MI (myocardial infarction) (HCC) 11/03/2014   Severe damage to the left ventricle    Orthostatic hypotension    Osteoporosis    bone density test done 2019   Skin cancer, basal cell    Past Surgical History:  Procedure Laterality Date   ABDOMINAL HYSTERECTOMY     bladder tack     CARDIAC CATHETERIZATION  11/03/2014   CATARACT EXTRACTION W/PHACO Left 09/20/2021   Procedure: CATARACT EXTRACTION PHACO AND INTRAOCULAR LENS PLACEMENT (IOC) LEFT 5.68 00:56.7;  Surgeon: Lockie Mola, MD;  Location: [facility];  Service: Ophthalmology;  Laterality: Left;   COLONOSCOPY WITH PROPOFOL N/A 07/01/2017   Procedure: COLONOSCOPY WITH PROPOFOL;  Surgeon: Scot Jun, MD;  Location: [facility] END[facility]COPY;  Service: Endoscopy;  Laterality: N/A;   GUM SURGERY     Patient Active Problem List   Diagnosis  Date Noted   Shortness of breath 10/22/2017   Coronary artery disease involving native coronary artery of native 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 Pt has been doing her feet / shoulder HEP . Pt bought propioceptive insoles for her shoe and will replace her flip flips with new sandals   SUBJECTIVE STATEMENT ON EVAL 08/22/23 : 1) pelvic pain:   mm spasms which caused pt to not be able to sit and she had burning sensation outside of vagina and pain with sexual intercourse.  Pt is taking premarin cream as needed. Pt is not having the spasms since taking premarin. Pt also uses jet stream in her tub with epsom salts.   2) incomplete urination occurs 60% of the time . Has no issues starting urine stream . Pt strains 5% of the time with bowel movements. Colon resection due to diverticulitis . Pt has to take Miralax and Colace to have bowel movements which now occurs 1-2 x day with Type 4 stool consistency. Pt has not had an infection from diverticulitis for 1.5 years.    3 )  R shoulder pain - 5/10 pain occurs inconsistently. The past 2 weeks, she notices it hurts putting hand behind back and raising arm . Radiates with neck to acromium ( pt points) .  Pt and husband lift weights on  Youtube but stopped due to shoulder pain.    4)  R sciatic pain- occurs 4-5/10 pain with sitting for 4-5 hours, standing > 30 min , radiates to side of thigh to knee   5) Feet pain:  Hx  broken bones in R foot 2024, L 2023.  Pt declined wearing a boot and surgery.  Feet pain occurs after walking 2 miles and she wakes up with an ache in B foot.  Pt has to wear a sleeve on a foot all the time when exercising and walking but in the house.  Pt walks barefoot in the house. Pt wears orthopedic inserts in her tennis shoes.   PERTINENT HISTORY:  Osteoporosis and gets Tx every 6 months  Colon resection due to diverticulitis  Abdominal surgeries for multiple ovarian cyst removals, hysterectomy, bladder tack One vaginal delivery with episiotomy  Heart attack 2016 with nitro/ still takes medication     Walks with husband 1-2 miles everyday , workout:  crunches/ sit ups    PAIN:  Are you having pain? Yes: see above   PRECAUTIONS: No  WEIGHT BEARING RESTRICTIONS:  No   FALLS:  Has patient fallen in last 6 months? Yes because of the ringing to her ears which affected her balance. Had ringing in her ears for 22+  years  LIVING ENVIRONMENT: Lives with: husband  Lives in: two story house  Stairs: a flight  Has following equipment at home:   OCCUPATION: retired Psychologist, counselling   PLOF: IND   PATIENT GOALS:  Improve pain and walking with more balance   OBJECTIVE:     [facility] PT Assessment - 09/26/23 1018       Palpation   Palpation comment tightness along ant dig/ ext dig, hypomobile tib fib, midfoot joints, plantar fascia   L)               [facility] Adult PT Treatment/Exercise - 09/26/23 1017       Neuro Re-ed    Neuro Re-ed Details  cued for new seated plantar strenghtening HEP,  modified in sitting instead of standing due to past Hx of Fx to toes      Manual Therapy   Manual therapy comments distraction, STM/MWM at problem areas  noted in assessment to promote DF/EV, decrease  plantar fascia tightness L               HOME EXERCISE PROGRAM: See pt instruction section    ASSESSMENT:  CLINICAL IMPRESSION:    Pt showed good carry over with increased DF/EV with sit to stand mechanics which will help with minimizing straining against gravity task and help with minimizing pelvic pain/ feet pain.    Continued to focuse on LKC today on L LE which still needed more DF AROM and mid foot mobility, ER  knee alignment and propioception today.             Whole person and structural alignment corrections will help promote optimize IAP system for improved pelvic floor function, trunk stability, gait, balance, stabilization with mobility tasks, minimize sciatica, shoulder, low back pain.    Plan to address pelvic floor issues after shoulder, LBP, feet pain been addressed to yield better longer lasting outcomes.  Pt benefits from skilled PT.    OBJECTIVE IMPAIRMENTS decreased activity tolerance, decreased coordination, decreased endurance, decreased mobility, difficulty walking, decreased ROM, decreased strength, decreased safety awareness, hypomobility, increased muscle spasms, impaired flexibility, improper body mechanics, postural dysfunction, and pain. scar restrictions   ACTIVITY LIMITATIONS  self-care,  home chores, work tasks    PARTICIPATION LIMITATIONS:  community, hobbies activities    PERSONAL FACTORS        are also affecting patient's functional outcome.    REHAB POTENTIAL: Good   CLINICAL DECISION MAKING: Evolving/moderate complexity   EVALUATION COMPLEXITY: Moderate    PATIENT EDUCATION:    Education details: Showed pt anatomy images. Explained muscles attachments/ connection, physiology of deep core system/ spinal- thoracic-pelvis-lower kinetic chain as they relate to pt's presentation, Sx, and past Hx. Explained what and how these areas of deficits need to be restored to balance and function    See Therapeutic activity / neuromuscular  re-education section  Answered pt's questions.   Person educated: Patient Education method: Explanation, Demonstration, Tactile cues, Verbal cues, and Handouts Education comprehension: verbalized understanding, returned demonstration, verbal cues required, tactile cues required, and needs further education     PLAN: PT FREQUENCY: 1x/week   PT DURATION: 10 weeks   PLANNED INTERVENTIONS: Therapeutic exercises, Therapeutic activity, Neuromuscular re-education, Balance training, Gait training, Patient/Family education, Self Care, Joint mobilization, Spinal mobilization, Moist heat, Taping, and Manual therapy, dry needling.   PLAN FOR NEXT SESSION: See clinical impression for plan     GOALS: Goals reviewed with patient? Yes  SHORT TERM GOALS: Target date: 09/19/2023    Pt will demo IND with HEP                    Baseline: Not IND            Goal status: INITIAL   LONG TERM GOALS: Target date: 10/31/2023    1.Pt will demo proper deep core coordination without chest breathing and optimal excursion of diaphragm/pelvic floor in order to promote spinal stability and pelvic floor function  Baseline: dyscoordination Goal status: INITIAL  2.  Pt will demo > 5 pt change on FOTO  to improve QOL and function  PFDI Urinary baseline - 25 Lower score = better function   Pelvic Pain baseline - 42 Lower score = better function   PFDI Bowel - 13 Prolapse - 33 Higher score = better function  Lumber baseline  - 54  Higher score = better function   Goal status: Deferred  3.  Pt will demo proper body mechanics in against gravity tasks and ADLs  work tasks, fitness  to minimize straining pelvic floor / back    Baseline: not IND, improper form that places strain on pelvic floor  Goal status: INITIAL    4. Pt will demo increased gait speed > 1.5 m/s with reciprocal gait pattern, longer stride length  in order to ambulate safely in community and return to fitness routine   Baseline: 1.3 m/s , excessive sway to R pelvis, minimal armswings  Goal status: INITIAL    5. Pt will demo no pain with  putting hand behind back and raising arm and  radiating pain at acromium in order to return to lifting weights to minimize osteoporosis worsening  Baseline:  Shoulder 5/10 pain occurs inconsistently. The past 2 weeks, she notices it hurts putting hand behind back and raising arm . Radiates with neck to acromium ( pt points)  Goal status: INITIAL   6. Pt will report no sciatic pain across 2 weeks , sitting for 4-5 hours one trip, standing > 30 min with no radiating pain , < 2/10  Baseline: R sciatica occurs 4-5/10 pain with sitting for 4-5 hours, standing > 30 min , radiates to side of thigh to knee  Goal status: INITIAL  7. Pt will demo levelled pelvic girdle and shoulder height in order to progress to deep core strengthening HEP and restore mobility at spine, pelvis, gait, posture minimize falls, and improve balance   Baseline: L shoulder, R iliac crest lowered Goal Status:  INITIAL  8. Decrease FTST by <5 sec  Baseline:16.01 sec no UE support  Goal Status: INITIAL    Mariane Masters, PT 08/22/2023, 9:48 AM

## 2023-09-26 NOTE — Patient Instructions (Signed)
Strengthening feet arches:     Heel raises - heels together,  seated Hold onto counter    Knees bent pointed out like a "v" , navel ( center of mass) more forward  Heels together as you lift, pointed out like a "v"  KNEES ARE ALIGNED BEHIND THE TOES TO MINIMIZE STRAIN ON THE KNEES your  navel ( center of mass) more forward to a avoid dropping down fast and rocking more weight back onto heels , keep heels pressing against each other the whole time   30 reps  ___

## 2023-09-30 ENCOUNTER — Ambulatory Visit: Payer: Medicare Other | Admitting: Physical Therapy

## 2023-09-30 DIAGNOSIS — R2689 Other abnormalities of gait and mobility: Secondary | ICD-10-CM

## 2023-09-30 DIAGNOSIS — R278 Other lack of coordination: Secondary | ICD-10-CM

## 2023-09-30 DIAGNOSIS — R293 Abnormal posture: Secondary | ICD-10-CM

## 2023-09-30 NOTE — Therapy (Signed)
OUTPATIENT PHYSICAL THERAPY TREATMENT      Patient Name: AMITY ROES MRN: [id number] DOB:24-Sep-1940, 83 y.o., female Today's Date: 09/30/2023   PT End of Session - 09/30/23 1330     Visit Number 7    Number of Visits 10    Date for PT Re-Evaluation 10/31/23    PT Start Time 1245    PT Stop Time 1330    PT Time Calculation (min) 45 min    Activity Tolerance Patient tolerated treatment well;No increased pain    Behavior During Therapy WFL for tasks assessed/performed             Past Medical History:  Diagnosis Date   Actinic keratosis 11/09/2008   L mid dorsum lat forearm - bx proven   Basal cell carcinoma 11/11/2007   R top of shoulder   Basal cell carcinoma 11/24/2013   L sup med scapula   Basal cell carcinoma 12/22/2013   Mid back spinal    Complication of anesthesia    Gas at dentist office "made me feel like I was going to die".   Diverticulitis    Dysplastic nevus    left forearm   Family history of adverse reaction to anesthesia    Mother - Rash   GERD (gastroesophageal reflux disease)    History of shingles 2015   Hyperlipidemia    Hypertension    MI (myocardial infarction) (HCC) 11/03/2014   Severe damage to the left ventricle    Orthostatic hypotension    Osteoporosis    bone density test done 2019   Skin cancer, basal cell    Past Surgical History:  Procedure Laterality Date   ABDOMINAL HYSTERECTOMY     bladder tack     CARDIAC CATHETERIZATION  11/03/2014   CATARACT EXTRACTION W/PHACO Left 09/20/2021   Procedure: CATARACT EXTRACTION PHACO AND INTRAOCULAR LENS PLACEMENT (IOC) LEFT 5.68 00:56.7;  Surgeon: Lockie Mola, MD;  Location: [facility];  Service: Ophthalmology;  Laterality: Left;   COLONOSCOPY WITH PROPOFOL N/A 07/01/2017   Procedure: COLONOSCOPY WITH PROPOFOL;  Surgeon: Scot Jun, MD;  Location: [facility];  Service: Endoscopy;  Laterality: N/A;   GUM SURGERY     Patient Active Problem List   Diagnosis  Date Noted   Shortness of breath 10/22/2017   Coronary artery disease involving native coronary artery of native heart without angina pectoris 09/05/2016   Orthostatic hypotension 03/07/2015   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   Anxiety 12/01/2014   Takotsubo syndrome 12/01/2014   Hyperlipidemia 12/01/2014    PCP: Bethann Punches MD   REFERRING PROVIDER: Christeen Douglas MD   REFERRING DIAG: Myagia   Rationale for Evaluation and Treatment Rehabilitation  THERAPY DIAG:  Other abnormalities of gait and mobility  Other lack of coordination  Abnormal posture  ONSET DATE:   SUBJECTIVE:  SUBJECTIVE STATEMENT TODAY:   Pt reports she had no pelvic spasms for the past 2 months and able to sit in car without pain. Pt also noticed she has had no near falls since starting PT when she used to have several near falls per week. Shoulder is gradually improving.    SUBJECTIVE STATEMENT ON EVAL 08/22/23 : 1) pelvic pain:   mm spasms which caused pt to not be able to sit and she had burning sensation outside of vagina and pain with sexual intercourse.  Pt is taking premarin cream as needed. Pt is not having the spasms since taking premarin. Pt also uses jet stream in her tub with epsom salts.   2) incomplete urination occurs 60% of the time . Has no issues starting urine stream . Pt strains 5% of the time with bowel movements. Colon resection due to diverticulitis . Pt has to take Miralax and Colace to have bowel movements which now occurs 1-2 x day with Type 4 stool consistency. Pt has not had an infection from diverticulitis for 1.5 years.    3 )  R shoulder pain - 5/10 pain occurs inconsistently. The past 2 weeks, she notices it hurts putting hand behind back and raising arm . Radiates with neck to  acromium ( pt points) .  Pt and husband lift weights on [employer] but stopped due to shoulder pain.    4)  R sciatic pain- occurs 4-5/10 pain with sitting for 4-5 hours, standing > 30 min , radiates to side of thigh to knee   5) Feet pain:  Hx  broken bones in R foot 2024, L 2023.  Pt declined wearing a boot and surgery.  Feet pain occurs after walking 2 miles and she wakes up with an ache in B foot.  Pt has to wear a sleeve on a foot all the time when exercising and walking but in the house.  Pt walks barefoot in the house. Pt wears orthopedic inserts in her tennis shoes.   PERTINENT HISTORY:  Osteoporosis and gets Tx every 6 months  Colon resection due to diverticulitis  Abdominal surgeries for multiple ovarian cyst removals, hysterectomy, bladder tack One vaginal delivery with episiotomy  Heart attack 2016 with nitro/ still takes medication     Walks with husband 1-2 miles everyday , workout:  crunches/ sit ups    PAIN:  Are you having pain? Yes: see above   PRECAUTIONS: No  WEIGHT BEARING RESTRICTIONS:  No   FALLS:  Has patient fallen in last 6 months? Yes because of the ringing to her ears which affected her balance. Had ringing in her ears for 22+  years  LIVING ENVIRONMENT: Lives with: husband  Lives in: two story house  Stairs: a flight  Has following equipment at home:   OCCUPATION: retired Psychologist, counselling   PLOF: IND   PATIENT GOALS:  Improve pain and walking with more balance   OBJECTIVE:    [facility] PT Assessment - 09/30/23 1458       Palpation   Palpation comment tightness along ant dig/ ext dig, hypomobile tib fib, midfoot joints, plantar fascia   L)             [facility] Adult PT Treatment/Exercise - 09/30/23 1457       Therapeutic Activites    Other Therapeutic Activities --      Neuro Re-ed    Neuro Re-ed Details  cued for new seated plantar strenghtening HEP with propioception awareness for toe ext/ abduction,  knee alingment ,  modified in sitting  instead of standing due to past Hx of Fx to toes      Manual Therapy   Manual therapy comments distraction, STM/MWM at problem areas noted in assessment to promote DF/EV, decrease plantar fascia tightness L                HOME EXERCISE PROGRAM: See pt instruction section    ASSESSMENT:  CLINICAL IMPRESSION:  Improvements: Pt reports she had no pelvic spasms for the past 2 months and able to sit in car without pain. Pt also noticed she has had no near falls since starting PT when she used to have several near falls per week.  Shoulder is gradually improving. Pt demo'd levelled pelvis and shoulder alignment and upright posture which helps with her balance and pelvic issues.   Continued to focus on  LKC today on L LE which still needed more DF AROM and mid foot mobility, ER  knee alignment and propioception today.   Cued for refined proprioception awareness with toe ext. , abduction , and knee alignment in last week's seated LKC and feet mobility HEP.  Plan to progress to deep core training and test standing SLS balance  and in upcoming sessions test PF standing .  Caution provided to advancing building PF strength gradually given Hx of Fx in toes and HX of osteoporosis.           Whole person and structural alignment corrections will help promote optimize IAP system for improved pelvic floor function, trunk stability, gait, balance, stabilization with mobility tasks, minimize sciatica, shoulder, low back pain and minimize fall risk.   Plan to address pelvic floor issues after shoulder, LBP, feet pain been addressed to yield better longer lasting outcomes.  Pt benefits from skilled PT.    OBJECTIVE IMPAIRMENTS decreased activity tolerance, decreased coordination, decreased endurance, decreased mobility, difficulty walking, decreased ROM, decreased strength, decreased safety awareness, hypomobility, increased muscle spasms, impaired flexibility, improper body mechanics, postural  dysfunction, and pain. scar restrictions   ACTIVITY LIMITATIONS  self-care,  home chores, work tasks    PARTICIPATION LIMITATIONS:  community, hobbies activities    PERSONAL FACTORS        are also affecting patient's functional outcome.    REHAB POTENTIAL: Good   CLINICAL DECISION MAKING: Evolving/moderate complexity   EVALUATION COMPLEXITY: Moderate    PATIENT EDUCATION:    Education details: Showed pt anatomy images. Explained muscles attachments/ connection, physiology of deep core system/ spinal- thoracic-pelvis-lower kinetic chain as they relate to pt's presentation, Sx, and past Hx. Explained what and how these areas of deficits need to be restored to balance and function    See Therapeutic activity / neuromuscular re-education section  Answered pt's questions.   Person educated: Patient Education method: Explanation, Demonstration, Tactile cues, Verbal cues, and Handouts Education comprehension: verbalized understanding, returned demonstration, verbal cues required, tactile cues required, and needs further education     PLAN: PT FREQUENCY: 1x/week   PT DURATION: 10 weeks   PLANNED INTERVENTIONS: Therapeutic exercises, Therapeutic activity, Neuromuscular re-education, Balance training, Gait training, Patient/Family education, Self Care, Joint mobilization, Spinal mobilization, Moist heat, Taping, and Manual therapy, dry needling.   PLAN FOR NEXT SESSION: See clinical impression for plan     GOALS: Goals reviewed with patient? Yes  SHORT TERM GOALS: Target date: 09/19/2023    Pt will demo IND with HEP  Baseline: Not IND            Goal status: INITIAL   LONG TERM GOALS: Target date: 10/31/2023    1.Pt will demo proper deep core coordination without chest breathing and optimal excursion of diaphragm/pelvic floor in order to promote spinal stability and pelvic floor function  Baseline: dyscoordination Goal status: INITIAL  2.  Pt will demo  > 5 pt change on FOTO  to improve QOL and function  PFDI Urinary baseline - 25 Lower score = better function   Pelvic Pain baseline - 42 Lower score = better function   PFDI Bowel - 13 Prolapse - 33 Higher score = better function  Lumber baseline  - 54  Higher score = better function   Goal status: Deferred   3.  Pt will demo proper body mechanics in against gravity tasks and ADLs  work tasks, fitness  to minimize straining pelvic floor / back    Baseline: not IND, improper form that places strain on pelvic floor  Goal status: INITIAL    4. Pt will demo increased gait speed > 1.5 m/s with reciprocal gait pattern, longer stride length  in order to ambulate safely in community and return to fitness routine  Baseline: 1.3 m/s , excessive sway to R pelvis, minimal armswings  Goal status: INITIAL    5. Pt will demo no pain with  putting hand behind back and raising arm and  radiating pain at acromium in order to return to lifting weights to minimize osteoporosis worsening  Baseline:  Shoulder 5/10 pain occurs inconsistently. The past 2 weeks, she notices it hurts putting hand behind back and raising arm . Radiates with neck to acromium ( pt points)  Goal status: INITIAL   6. Pt will report no sciatic pain across 2 weeks , sitting for 4-5 hours one trip, standing > 30 min with no radiating pain , < 2/10  Baseline: R sciatica occurs 4-5/10 pain with sitting for 4-5 hours, standing > 30 min , radiates to side of thigh to knee  Goal status: INITIAL  7. Pt will demo levelled pelvic girdle and shoulder height in order to progress to deep core strengthening HEP and restore mobility at spine, pelvis, gait, posture minimize falls, and improve balance   Baseline: L shoulder, R iliac crest lowered Goal Status:  INITIAL  8. Decrease FTST by <5 sec  Baseline:16.01 sec no UE support  Goal Status: INITIAL    Mariane Masters, PT 08/22/2023, 9:48 AM

## 2023-10-03 ENCOUNTER — Ambulatory Visit: Payer: Medicare Other | Admitting: Physical Therapy

## 2023-10-10 ENCOUNTER — Ambulatory Visit: Payer: Medicare Other | Admitting: Physical Therapy

## 2023-10-10 DIAGNOSIS — R2689 Other abnormalities of gait and mobility: Secondary | ICD-10-CM | POA: Diagnosis not present

## 2023-10-10 DIAGNOSIS — R278 Other lack of coordination: Secondary | ICD-10-CM

## 2023-10-10 DIAGNOSIS — R293 Abnormal posture: Secondary | ICD-10-CM

## 2023-10-10 NOTE — Patient Instructions (Signed)
 Moisturizers ?             They are used in the vagina to hydrate the mucous membrane that make up the vaginal canal. ?             Designed to keep a more normal acid balance (ph) ?             Once placed in the vagina, it will last between two to three days. ?             Use 2-3 times per week at bedtime and last longer than 60 min. ?             Ingredients to avoid is glycerin and fragrance, can increase chance of infection ?             Should not be used just before sex due to causing irritation ?             Most are gels administered either in a tampon-shaped applicator or as a vaginal suppository.  ?              They  are non-hormonal. ?  ?  ?Types of Moisturizers ?             Leatrice Jewels- drug store ?             Vitamin E vaginal suppositories- Whole foods, Amazon ?             Moist Again ?             Coconut oil- can break down condoms ?             Julva- (Do no use if on Tamoxifen) amazon ?             Yes moisturizer- amazon ?             NeuEve Silk , NeuEve Silver for menopausal or over 65 (if have severe vaginal atrophy or cancer  ?              treatments use NeuEve Silk for  1 month than move to [employer])- [employer], [email or web address] ?             Olive and Bee intimate cream- [email or web address] ?             Mae vaginal moisturizer- Amazon ?   ?  ?Lubrication ?             Used for intercourse to reduce friction ?             Avoid ones that have glycerin, warming gels, tingling gels, icing or cooling gel, scented ?             Avoid parabens due to a preservative similar to female sex hormone ?             May need to be reapplied once or several times during sexual activity ?             Can be applied to both partners genitals prior to vaginal penetration to minimize friction or irritation ?             Prevent irritation and mucosal tears that cause post coital pain and increased the risk of vaginal and ?               urinary tract infections ?  Oil-based lubricants cannot be  used with condoms due to breaking them down.  Least likely to irritate ?               vaginal tissue. ?             Plant based-lubes are safe ?             Silicone-based lubrication are thicker and last long and used for post-menopausal women ?  ?Vaginal Lubricators ?Here is a list of some suggested lubricators you can use for intercourse. Use the most hypoallergenic product.  You can place on you or your partner. ?.     Slippery Stuff ?    Sylk or Sliquid Natural H2O ( good  if frequent UTI?s) ?               Blossom Organics ([email or web address]-[email or web address]) ?               Luvena ?               Coconut oil ?               PJur Woman Nude- water based lubricant, amazon ?               Rubin Payor- Amazon ?               Aloe Vera ?               Yes lubricant- Amazon ?               Wet Platinum-Silicone, [employer], [facility] ?               Olive and Bee intimate cream-  [email or web address] ? ?Things to avoid in lubricants are glycerin, warming gels, tingling gels, icing or cooling ?gels, and scented gels.  Also avoid Vaseline. ?KY jelly, Replens, and Astroglide kills good bacteria(lactobacilli) ?  ?Things to avoid in the vaginal area ?             Do not use things to irritate the vulvar area ?             No lotions- see below ?             No soaps; can use Aveeno or Calendula cleanser if needed. Must be gentle ?             No deodorants ?             No douches ?             Good to sleep without underwear to let the vaginal area to air out ?             No scrubbing: spread the lips to let warm water rinse over labias and pat dry ?  ? ?

## 2023-10-10 NOTE — Therapy (Signed)
 OUTPATIENT PHYSICAL THERAPY TREATMENT       Patient Name: Debra Moody MRN: [id number] DOB:1941/04/11, 83 y.o., female Today's Date: 10/10/2023   PT End of Session - 10/10/23 0944     Visit Number 8    Number of Visits 10    Date for PT Re-Evaluation 10/31/23    Activity Tolerance Patient tolerated treatment well;No increased pain    Behavior During Therapy WFL for tasks assessed/performed             Past Medical History:  Diagnosis Date   Actinic keratosis 11/09/2008   L mid dorsum lat forearm - bx proven   Basal cell carcinoma 11/11/2007   R top of shoulder   Basal cell carcinoma 11/24/2013   L sup med scapula   Basal cell carcinoma 12/22/2013   Mid back spinal    Complication of anesthesia    Gas at dentist office "made me feel like I was going to die".   Diverticulitis    Dysplastic nevus    left forearm   Family history of adverse reaction to anesthesia    Mother - Rash   GERD (gastroesophageal reflux disease)    History of shingles 2015   Hyperlipidemia    Hypertension    MI (myocardial infarction) (HCC) 11/03/2014   Severe damage to the left ventricle    Orthostatic hypotension    Osteoporosis    bone density test done 2019   Skin cancer, basal cell    Past Surgical History:  Procedure Laterality Date   ABDOMINAL HYSTERECTOMY     bladder tack     CARDIAC CATHETERIZATION  11/03/2014   CATARACT EXTRACTION W/PHACO Left 09/20/2021   Procedure: CATARACT EXTRACTION PHACO AND INTRAOCULAR LENS PLACEMENT (IOC) LEFT 5.68 00:56.7;  Surgeon: Lockie Mola, MD;  Location: [facility];  Service: Ophthalmology;  Laterality: Left;   COLONOSCOPY WITH PROPOFOL N/A 07/01/2017   Procedure: COLONOSCOPY WITH PROPOFOL;  Surgeon: Scot Jun, MD;  Location: [facility];  Service: Endoscopy;  Laterality: N/A;   GUM SURGERY     Patient Active Problem List   Diagnosis Date Noted   Shortness of breath 10/22/2017   Coronary artery disease 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  ONSET DATE:   SUBJECTIVE:  SUBJECTIVE STATEMENT TODAY:   Pt had no pain with sexual intercourse for the first time in 50 years. Pt had skin sensitivities after sex with skin tears but she used premarin.  Pt did not use lubrication during sex.   Pt feels more confident with not falling because she is paying more attention to her feet. Pt is using the [place] ball  and inserts for her shoes.    SUBJECTIVE STATEMENT ON EVAL 08/22/23 : 1) pelvic pain:   mm spasms which caused pt to not be able to sit and she had burning sensation outside of vagina and pain with sexual intercourse.  Pt is taking premarin cream as needed. Pt is not having the spasms since taking premarin. Pt also uses jet stream in her tub with epsom salts.   2) incomplete urination occurs 60% of the time . Has no issues starting urine stream . Pt strains 5% of the time with bowel movements. Colon resection due to diverticulitis . Pt has to take Miralax and Colace to have bowel movements which now occurs 1-2 x day with Type 4 stool consistency. Pt has not had an infection from diverticulitis for 1.5 years.    3 )  R shoulder pain - 5/10 pain occurs inconsistently. The past 2 weeks, she notices it hurts putting hand behind back and raising arm . Radiates with neck to acromium ( pt points) .  Pt and husband lift weights on  Youtube but stopped due to shoulder pain.    4)  R sciatic pain- occurs 4-5/10 pain with sitting for 4-5 hours, standing > 30 min , radiates to side of thigh to knee   5) Feet pain:  Hx  broken bones in R foot 2024, L 2023.  Pt declined wearing a boot and surgery.  Feet pain occurs after walking 2 miles and she wakes up with an ache in B foot.  Pt has to wear a sleeve on a foot all the time when exercising and walking but in the house.  Pt walks barefoot in the house. Pt wears orthopedic inserts in her tennis shoes.   PERTINENT HISTORY:  Osteoporosis and gets Tx every 6 months  Colon resection due to diverticulitis  Abdominal surgeries for multiple ovarian cyst removals, hysterectomy, bladder tack One vaginal delivery with episiotomy  Heart attack 2016 with nitro/ still takes medication     Walks with husband 1-2 miles everyday , workout:  crunches/ sit ups    PAIN:  Are you having pain? Yes: see above   PRECAUTIONS: No  WEIGHT BEARING RESTRICTIONS:  No   FALLS:  Has patient fallen in last 6 months? Yes because of the ringing to her ears which affected her balance. Had ringing in her ears for 22+  years  LIVING ENVIRONMENT: Lives with: husband  Lives in: two story house  Stairs: a flight  Has following equipment at home:   OCCUPATION: retired Psychologist, counselling   PLOF: IND   PATIENT GOALS:  Improve pain and walking with more balance   OBJECTIVE:     [facility] PT Assessment - 10/10/23 1000       Observation/Other Assessments   Observations proper sitting posture and more upright without cues      Palpation   Spinal mobility L shoulder lowered, thoracic convex to L      Transfers   Five time sit to stand comments  9.50 sec no UE support      Ambulation/Gait   Gait Comments 1.78 m/s reciprocal  [facility] Adult PT Treatment/Exercise - 10/10/23 1000       Self-Care   Other Self-Care Comments  discussed difference between moisturization and lubricants for  vulvar health and minimizing skin tears during sexual intercourse,      Therapeutic Activites    Other Therapeutic Activities reassessed goals, discussed continuation of sessions to focus on shoulder limitation and return to using weights in fitness, continuum of care with balance and falls prevention training after completing Pelvic PT                    HOME EXERCISE PROGRAM: See pt instruction section    ASSESSMENT:  CLINICAL IMPRESSION  Pt has met 4/7 goals and progressing well towards remaining goals.    Improvements: Pt reports she had no pelvic spasms for the past 2 months and able to sit in car without pain Pt also noticed she has had no near falls since starting PT when she used to have several near falls per week. Pt feels more confident with not falling because she is paying more attention to her feet.  Shoulder is gradually improving. Pt demo'd levelled pelvis and shoulder alignment and upright posture which helps with her balance and pelvic issues.  Pt had no pain with sexual intercourse for the first time in 50 years.     Caution provided to advancing building PF strength gradually given Hx of Fx in toes and HX of osteoporosis.   Discussed difference between moisturization and lubricants for vulvar health and minimizing skin tears during sexual intercourse. Reassessed goals, discussed continuation of sessions to focus on shoulder limitation and return to using weights in fitness,    continuum of care with balance and falls prevention training after completing Pelvic PT.           Whole person and structural alignment corrections will help promote optimize IAP system for improved pelvic floor function, trunk stability, gait, balance, stabilization with mobility tasks, minimize sciatica, shoulder, low back pain and minimize fall risk.   Pt benefits from skilled PT.    OBJECTIVE IMPAIRMENTS decreased activity tolerance, decreased coordination, decreased  endurance, decreased mobility, difficulty walking, decreased ROM, decreased strength, decreased safety awareness, hypomobility, increased muscle spasms, impaired flexibility, improper body mechanics, postural dysfunction, and pain. scar restrictions   ACTIVITY LIMITATIONS  self-care,  home chores, work tasks    PARTICIPATION LIMITATIONS:  community, hobbies activities    PERSONAL FACTORS        are also affecting patient's functional outcome.    REHAB POTENTIAL: Good   CLINICAL DECISION MAKING: Evolving/moderate complexity   EVALUATION COMPLEXITY: Moderate    PATIENT EDUCATION:    Education details: Showed pt anatomy images. Explained muscles attachments/ connection, physiology of deep core system/ spinal- thoracic-pelvis-lower kinetic chain as they relate to pt's presentation, Sx, and past Hx. Explained what and how these areas of deficits need to be restored to balance and function    See Therapeutic activity / neuromuscular re-education section  Answered pt's questions.   Person educated: Patient Education method: Explanation, Demonstration, Tactile cues, Verbal cues, and Handouts Education comprehension: verbalized understanding, returned demonstration, verbal cues required, tactile cues required, and needs further education     PLAN: PT FREQUENCY: 1x/week   PT DURATION: 10 weeks   PLANNED INTERVENTIONS: Therapeutic exercises, Therapeutic activity, Neuromuscular re-education, Balance training, Gait training, Patient/Family education, Self Care, Joint mobilization, Spinal mobilization, Moist heat, Taping, and Manual therapy, dry needling.   PLAN FOR NEXT SESSION: See clinical impression for plan  GOALS: Goals reviewed with patient? Yes  SHORT TERM GOALS: Target date: 09/19/2023    Pt will demo IND with HEP                    Baseline: Not IND            Goal status: INITIAL   LONG TERM GOALS: Target date: 10/31/2023    1.Pt will demo proper deep core  coordination without chest breathing and optimal excursion of diaphragm/pelvic floor in order to promote spinal stability and pelvic floor function  Baseline: dyscoordination Goal status: MET    2.  Pt will demo > 5 pt change on FOTO  to improve QOL and function  PFDI Urinary baseline - 25 Lower score = better function   Pelvic Pain baseline - 42 Lower score = better function   PFDI Bowel - 13 Prolapse - 33 Higher score = better function  Lumber baseline  - 54  Higher score = better function   Goal status: Deferred   3.  Pt will demo proper body mechanics in against gravity tasks and ADLs  work tasks, fitness  to minimize straining pelvic floor / back    Baseline: not IND, improper form that places strain on pelvic floor  Goal status: Ogoing ( 10/10/23: not returned to using 5 and 10 lb weights with fitness due to shoulder pain)    4. Pt will demo increased gait speed > 1.5 m/s with reciprocal gait pattern, longer stride length  in order to ambulate safely in community and return to fitness routine  Baseline: 1.3 m/s , excessive sway to R pelvis, minimal armswings  Goal status: MET ( 10/10/23: 1.78 m/s reciprocal )     5. Pt will demo no pain with  putting hand behind back and raising arm and  radiating pain at acromium in order to return to lifting weights to minimize osteoporosis worsening  Baseline:  Shoulder 5/10 pain occurs inconsistently. The past 2 weeks, she notices it hurts putting hand behind back and raising arm . Radiates with neck to acromium ( pt points)  Goal status: Partially Met ( 10/10/23:  1-2/10 pain)    6. Pt will report no sciatic pain across 2 weeks , sitting for 4-5 hours one trip, standing > 30 min with no radiating pain , < 2/10  Baseline: R sciatica occurs 4-5/10 pain with sitting for 4-5 hours, standing > 30 min , radiates to side of thigh to knee  Goal status:  MET  ( 10/10/23: no radiating pain, able to stand > 30 min.  Pt has not travelled for a  long car ride to know about pain)   7. Pt will demo levelled pelvic girdle and shoulder height in order to progress to deep core strengthening HEP and restore mobility at spine, pelvis, gait, posture minimize falls, and improve balance   Baseline: L shoulder, R iliac crest lowered Goal Status: MET   8. Decrease FTST by >5 sec change  Baseline:16.01 sec no UE support  Goal Status: MET ( 10/10/23:  9.50 sec)    Mariane Masters, PT 08/22/2023, 9:48 AM

## 2023-10-17 ENCOUNTER — Ambulatory Visit: Payer: Medicare Other | Admitting: Dermatology

## 2023-10-17 ENCOUNTER — Ambulatory Visit: Payer: Medicare Other | Admitting: Physical Therapy

## 2023-10-21 NOTE — Progress Notes (Unsigned)
 Date:  10/22/2023   ID:  Debra Moody, DOB [birth year], MRN [id number]  Patient Location:  [address] [place] [address]   Provider location:   [facility], [place] office  PCP:  Danella Penton, MD  Cardiologist:  [facility]  Chief Complaint  Patient presents with   12 month follow up     Patient c/o chest pain, is under a lot of stress and had a syncopal spell last Friday.     History of Present Illness:    Debra Moody is a 83 y.o. female  past medical history of GERD, diverticulitis,  hysterectomy in the 80s, prior bladder surgery, elbow fracture October 2014,  shingles September 2015,  Echo 2009 ejection fraction 35 to 40% previously seen for chest pain, elevated cardiac enzymes,  stress related cardiomyopathy. Cardiac catheterization March 2016 no coronary artery disease,  Ejection fraction 25% at that time EF in 12/2014 was 60 to 65%, EF 60% in September 2019 Osteoporosis she presents today for follow-up of her nonischemic cardiomyopathy   LOV 1/24 Reports under a lot of stress husband with cancer Off losartan since 2024  Trip to [place] 3 weeks PMD performed echo stress test  Flushing last Thursday 10/17/23, fell onto left elbow and hip Brief lapse of memory October 18, 2023 Church on Sunday, flushing  Gait instability Active with no regular exercise  Staying hydrated, drinks a lot of fluid  Continues to take nitro for periodic chest pain Typically not with exertion May come on more with stress  chronic foot pain Problems with her arches  Tolerating full dose Crestor 5 and Zetia daily  Labs reviewed A1C 6.1 Total chol 174 LDL 82 CR 0.8  EKG personally reviewed by myself on todays visit EKG Interpretation Date/Time:  Tuesday October 22 2023 14:36:41 EDT Ventricular Rate:  77 PR Interval:  172 QRS Duration:  72 QT Interval:  358 QTC Calculation: 405 R Axis:   64  Text Interpretation: Normal sinus rhythm Possible  Left atrial enlargement When compared with ECG of 09-Sep-2017 13:41, No significant change was found Confirmed by Julien Nordmann [phone]) on 10/22/2023 2:43:26 PM    Other past medical history reviewed Presented to the emergency room September 09, 2017 migraine Several hours later had central chest tightness nonradiating Chest pain resolved on its own  periodic diverticulitis Hx of osteoarthritis    she was traveling out of state, was in [place] when she developed amnesia type symptoms. Her husband took her to the hospital. She had MRI and CT of the head that showed no stroke. Further workup showed abnormal EKG, abnormal echocardiogram, elevated troponin up to more than 3. Ischemia could not be excluded and she was taken to the cardiac catheterization lab 11/03/2014. 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 as there was LV dysfunction, ejection fraction estimated at 25% with anteroapical, apical and apical inferior wall akinesis   She was noted to have coronary spasm with a reversible 90% proximal RCA lesion, reversible 50% proximal left circumflex lesion She was discharged home on low-dose carvedilol and lisinopril    Past Medical History:  Diagnosis Date   Actinic keratosis 11/09/2008   L mid dorsum lat forearm - bx proven   Basal cell carcinoma 11/11/2007   R top of shoulder   Basal cell carcinoma 11/24/2013   L sup med scapula   Basal cell carcinoma 12/22/2013   Mid back spinal    Complication  of anesthesia    Gas at dentist office "made me feel like I was going to die".   Diverticulitis    Dysplastic nevus    left forearm   Family history of adverse reaction to anesthesia    Mother - Rash   GERD (gastroesophageal reflux disease)    History of shingles 2015   Hyperlipidemia    Hypertension    MI (myocardial infarction) (HCC) 11/03/2014   Severe damage to the left ventricle    Orthostatic hypotension    Osteoporosis    bone  density test done 2019   Skin cancer, basal cell    Past Surgical History:  Procedure Laterality Date   ABDOMINAL HYSTERECTOMY     bladder tack     CARDIAC CATHETERIZATION  11/03/2014   CATARACT EXTRACTION W/PHACO Left 09/20/2021   Procedure: CATARACT EXTRACTION PHACO AND INTRAOCULAR LENS PLACEMENT (IOC) LEFT 5.68 00:56.7;  Surgeon: Lockie Mola, MD;  [facility];  Service: Ophthalmology;  Laterality: Left;   COLONOSCOPY WITH PROPOFOL N/A 07/01/2017   Procedure: COLONOSCOPY WITH PROPOFOL;  Surgeon: Scot Jun, MD;  Location[facility];  Service: Endoscopy;  Laterality: N/A;   GUM SURGERY       Current Meds  Medication Sig   amLODipine (NORVASC) 2.5 MG tablet Take 2.5 mg by mouth daily.   aspirin 81 MG tablet Take 81 mg by mouth daily.   Calcium-Magnesium-Vitamin D (CALCIUM 500 PO) Take 2,000 mg by mouth 2 (two) times daily.    Coenzyme Q10 (COQ10) 100 MG CAPS Take 100 mg by mouth daily.   conjugated estrogens (PREMARIN) vaginal cream Place 1 applicator vaginally daily.   Cranberry 125 MG TABS Take by mouth in the morning and at bedtime.   Denosumab (PROLIA Glynn) Inject 1 Units into the skin every 6 (six) months.   diphenhydrAMINE (BENADRYL) 25 mg capsule Take 25 mg by mouth every 6 (six) hours as needed.    DM-Doxylamine-Acetaminophen 15-6.25-325 MG/15ML LIQD Take by mouth.   docusate sodium (COLACE) 100 MG capsule Take 100 mg by mouth 2 (two) times daily.   ezetimibe (ZETIA) 10 MG tablet TAKE 1 TABLET(10 MG) BY MOUTH DAILY   famotidine (PEPCID) 10 MG tablet Take 10 mg by mouth daily.   ibuprofen (ADVIL) 200 MG tablet Take 200 mg by mouth every 4 (four) hours as needed.    Krill Oil 1000 MG CAPS Take by mouth.   Latanoprost 0.005 % EMUL    Magnesium 400 MG CAPS Take by mouth daily.   mometasone (ELOCON) 0.1 % cream Apply to rash on chest QD-BID PRN flares.   nitroGLYCERIN (NITROSTAT) 0.3 MG SL tablet Place 1 tablet (0.3 mg total) under the tongue  every 5 (five) minutes as needed for chest pain.   Omega-3 Fatty Acids (FISH OIL) 1000 MG CAPS Take 1,200 mg by mouth daily.    Polyethylene Glycol 3350 (MIRALAX PO) Take by mouth every evening.   Probiotic Product (PROBIOTIC DAILY PO) Take by mouth daily.   Propylene Glycol (SYSTANE COMPLETE OP) Apply to eye as needed.   rosuvastatin (CRESTOR) 5 MG tablet TAKE 1 TABLET(5 MG) BY MOUTH DAILY   Simethicone 125 MG TABS Take by mouth as needed.    TURMERIC PO Take by mouth. Turmeric 700/Apple cider Vinegar 500   Varenicline Tartrate (TYRVAYA) 0.03 MG/ACT SOLN daily.   Vitamin A 3 MG (10000 UT) TABS Take 2,400 Units by mouth daily.   vitamin B-12 (CYANOCOBALAMIN) 1000 MCG tablet Take 1,000 mcg by mouth  daily.   zinc gluconate 50 MG tablet Take 50 mg by mouth daily.     Allergies:   Alendronate sodium, Ibandronate, Simvastatin, and Amoxicillin-pot clavulanate   Social History   Tobacco Use   Smoking status: Former    Current packs/day: 0.00    Types: Cigarettes    Start date: 11/30/1981    Quit date: 12/01/1999    Years since quitting: 23.9   Smokeless tobacco: Never  Vaping Use   Vaping status: Never Used  Substance Use Topics   Alcohol use: Yes    Comment: rare   Drug use: No     Family Hx: The patient's family history includes Heart attack in her mother; Heart attack (age of onset: 64) in her father; Heart disease in her father and mother. There is no history of Breast cancer.  ROS:   Please see the history of present illness.    Review of Systems  Constitutional: Negative.   HENT: Negative.    Respiratory: Negative.    Cardiovascular: Negative.   Gastrointestinal: Negative.   Musculoskeletal: Negative.        Foot pain  Neurological:  Positive for loss of consciousness.  Psychiatric/Behavioral: Negative.    All other systems reviewed and are negative.    Labs/Other Tests and Data Reviewed:    Recent Labs: No results found for requested labs within last 365 days.    Recent Lipid Panel No results found for: "CHOL", "TRIG", "HDL", "CHOLHDL", "LDLCALC", "LDLDIRECT"  Wt Readings from Last 3 Encounters:  10/22/23 109 lb 2 oz (49.5 kg)  08/20/22 108 lb 6 oz (49.2 kg)  09/20/21 110 lb (49.9 kg)     Exam:    BP (!) 160/60 (BP Location: Left Arm, Patient Position: Sitting, Cuff Size: Normal)   Pulse 77   Ht 5' 2.75" (1.594 m)   Wt 109 lb 2 oz (49.5 kg)   SpO2 98%   BMI 19.48 kg/m   Constitutional:  oriented to person, place, and time. No distress.  HENT:  Head: Grossly normal Eyes:  no discharge. No scleral icterus.  Neck: No JVD, no carotid bruits  Cardiovascular: Regular rate and rhythm, no murmurs appreciated Pulmonary/Chest: Clear to auscultation bilaterally, no wheezes or rails Abdominal: Soft.  no distension.  no tenderness.  Musculoskeletal: Normal range of motion Neurological:  normal muscle tone. Coordination normal. No atrophy Skin: Skin warm and dry Psychiatric: normal affect, pleasant  ASSESSMENT & PLAN:    Takotsubo syndrome Echocardiogram with normal ejection fraction normal ejection fraction on last 3 echos, 1 done most recently through primary care Given low blood pressure and flushing symptoms recommend she stop amlodipine  Congestive dilated cardiomyopathy (HCC) Euvolemic No further workup needed EKG unchanged, Blood pressure low, she has flushing and recent fall, recommend she hold amlodipine  Coronary artery disease involving native coronary artery of native heart without angina pectoris Rare spasm relieved with nitro . Stress test with no ischemic findings  Anxiety Recommend continued stress reduction techniques Husband with medical issues  Mixed hyperlipidemia On crestor 5 daily and Zetia 10 daily  Orthostatic hypotension Orthostatic numbers positive today 123 supine down to 108 standing heart rate 70 up to 89 standing Recommend she stop amlodipine  Shortness of breath Recommend exercise  program   Signed, Julien Nordmann, MD  10/22/2023 3:01 PM    [facility] [address], [place], [place] [address]

## 2023-10-22 ENCOUNTER — Ambulatory Visit: Payer: Medicare Other | Attending: Cardiovascular Disease | Admitting: Cardiovascular Disease

## 2023-10-22 VITALS — BP 160/60 | HR 77 | Ht 62.75 in | Wt 109.1 lb

## 2023-10-22 DIAGNOSIS — R0602 Shortness of breath: Secondary | ICD-10-CM

## 2023-10-22 DIAGNOSIS — E782 Mixed hyperlipidemia: Secondary | ICD-10-CM | POA: Diagnosis not present

## 2023-10-22 DIAGNOSIS — I420 Dilated cardiomyopathy: Secondary | ICD-10-CM | POA: Diagnosis not present

## 2023-10-22 DIAGNOSIS — I951 Orthostatic hypotension: Secondary | ICD-10-CM

## 2023-10-22 DIAGNOSIS — I2510 Atherosclerotic heart disease of native coronary artery without angina pectoris: Secondary | ICD-10-CM

## 2023-10-22 DIAGNOSIS — I5181 Takotsubo syndrome: Secondary | ICD-10-CM | POA: Diagnosis present

## 2023-10-22 MED ORDER — NITROGLYCERIN 0.3 MG SL SUBL: 0.3000 mg | SUBLINGUAL_TABLET | SUBLINGUAL | 3 refills | Status: DC | PRN

## 2023-10-22 MED ORDER — EZETIMIBE 10 MG PO TABS
10.0000 mg | ORAL_TABLET | Freq: Every day | ORAL | 3 refills | Status: AC
Start: 2023-10-22 — End: ?

## 2023-10-22 MED ORDER — ROSUVASTATIN CALCIUM 5 MG PO TABS: ORAL_TABLET | ORAL | 3 refills | Status: AC

## 2023-10-22 NOTE — Patient Instructions (Addendum)
 Medication Instructions:  Please hold the amlodipine  If you need a refill on your cardiac medications before your next appointment, please call your pharmacy.   Lab work: No new labs needed  Testing/Procedures: No new testing needed  Follow-Up: At [facility], you and your health needs are our priority.  As part of our continuing mission to provide you with exceptional heart care, we have created designated Provider Care Teams.  These Care Teams include your primary Cardiologist (physician) and Advanced Practice Providers (APPs -  Physician Assistants and Nurse Practitioners) who all work together to provide you with the care you need, when you need it.  You will need a follow up appointment in 12 months  Providers on your designated Care Team:   Nicolasa Ducking, NP Eula Listen, PA-C Cadence Fransico Michael, [place]  COVID-19 Vaccine Information can be found at: [email or web address] For questions related to vaccine distribution or appointments, please email [email or web address] or call [phone].

## 2023-10-24 ENCOUNTER — Ambulatory Visit: Payer: Medicare Other | Attending: Obstetrics and Gynecology | Admitting: Physical Therapy

## 2023-10-24 DIAGNOSIS — R278 Other lack of coordination: Secondary | ICD-10-CM | POA: Insufficient documentation

## 2023-10-24 DIAGNOSIS — R293 Abnormal posture: Secondary | ICD-10-CM | POA: Insufficient documentation

## 2023-10-24 DIAGNOSIS — R2689 Other abnormalities of gait and mobility: Secondary | ICD-10-CM | POA: Diagnosis present

## 2023-10-24 NOTE — Patient Instructions (Signed)
   Transition from standing to floor if you have hypertension or can not have your head lower than your heart  Wide squat in front of a CHAIR  Forearms onto the chair Lower knees down into double kneeling   floor   To get up Walk on your knees to the front of chair again Forearms onto the chair Inhale, exhale, pushing onto the forearms to life  lifts hips up, kneeping knees bent hands on thighs, then hips then pause HERE  to avoid (moving too quickly up/ blood rush) -->  knees glide forward and roll  Hips up instead of hinging spine up   * KEEP YOUR HEAD AND HEART LEVELLED , NEVER LETTING YOUR HEAD GET BELOW YOUR HEART  __  Dolphin squats on wall   Fingers interlaced, elbows shoulder width apart, forearms in a triangle pressing against the wall Mini squat position   Inhale, exhale chin tucked, shoulders lengthen down from ears, as you rise up not locking knees, hairline brushes by thumbs ( "like dolphin snout diving up out of the water"  10 reps with heel down, 10 reps with heels up   Make sure to not let the front ribs flare out, ribs over the pelvis aligned to not have a swayed back  Pressure through ballmounds to rise up, and dont lock the knees   __  Wall push ups with elbows close to ribs and chin tuck  10 reps    ___  Side arm push up by wall,  Elbow by ribs 20 reps each    __  ** index finger ballmound pressure down with hand position

## 2023-10-24 NOTE — Therapy (Signed)
 OUTPATIENT PHYSICAL THERAPY TREATMENT   / Discharge Summary across 9 visits      Patient Name: Debra Moody MRN: [id number] DOB:[birth year], 82 y.o., female Today's Date: 10/24/2023   PT End of Session - 10/24/23 0942     Visit Number 9    Number of Visits 10    Date for PT Re-Evaluation 10/31/23    PT Start Time 0938    PT Stop Time 1018    PT Time Calculation (min) 40 min    Activity Tolerance Patient tolerated treatment well;No increased pain    Behavior During Therapy WFL for tasks assessed/performed             Past Medical History:  Diagnosis Date   Actinic keratosis 11/09/2008   L mid dorsum lat forearm - bx proven   Basal cell carcinoma 11/11/2007   R top of shoulder   Basal cell carcinoma 11/24/2013   L sup med scapula   Basal cell carcinoma 12/22/2013   Mid back spinal    Complication of anesthesia    Gas at dentist office "made me feel like I was going to die".   Diverticulitis    Dysplastic nevus    left forearm   Family history of adverse reaction to anesthesia    Mother - Rash   GERD (gastroesophageal reflux disease)    History of shingles 2015   Hyperlipidemia    Hypertension    MI (myocardial infarction) (HCC) 11/03/2014   Severe damage to the left ventricle    Orthostatic hypotension    Osteoporosis    bone density test done 2019   Skin cancer, basal cell    Past Surgical History:  Procedure Laterality Date   ABDOMINAL HYSTERECTOMY     bladder tack     CARDIAC CATHETERIZATION  11/03/2014   CATARACT EXTRACTION W/PHACO Left 09/20/2021   Procedure: CATARACT EXTRACTION PHACO AND INTRAOCULAR LENS PLACEMENT (IOC) LEFT 5.68 00:56.7;  Surgeon: Lockie Mola, MD;  Location: [facility];  Service: Ophthalmology;  Laterality: Left;   COLONOSCOPY WITH PROPOFOL N/A 07/01/2017   Procedure: COLONOSCOPY WITH PROPOFOL;  Surgeon: Scot Jun, MD;  Location: [facility];  Service: Endoscopy;  Laterality: N/A;   GUM SURGERY      Patient Active Problem List   Diagnosis Date Noted   Shortness of breath 10/22/2017   Coronary artery disease involving native coronary artery of native heart without angina pectoris 09/05/2016   Orthostatic hypotension 03/07/2015   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   Anxiety 12/01/2014   Takotsubo syndrome 12/01/2014   Hyperlipidemia 12/01/2014    PCP: Bethann Punches MD   REFERRING PROVIDER: Christeen Douglas MD   REFERRING DIAG: Myagia   Rationale for Evaluation and Treatment Rehabilitation  THERAPY DIAG:  Other abnormalities of gait and mobility  Abnormal posture  Other lack of coordination  ONSET DATE:   SUBJECTIVE:  SUBJECTIVE STATEMENT TODAY:   Pt had 3 episodes last week  where she got very hot and broke into a sweat and these episodes are related to her heart and past Hx of heart attack. Pt had a fall last Thursday due to syncope. Pt landed on her L elbow and L hip . Pt has a bruise on her L elbow and minor soreness on her hip. Pt was able to get back up on her own.    Pt has seen her cardiologist since these episodes and fall.  These episodes were likely related to a side effect of a medication which she is no longer taking. Stress with caretaking for her husband' s medical conditions also play a role to her episodes .    Pt has received an order for balance and falls prevention training from Dr. Hyacinth Meeker (PCP) and Dr. Mariah Milling ( cardiologist ) also wanted her to undergoing balance training as well.    SUBJECTIVE STATEMENT ON EVAL 08/22/23 : 1) pelvic pain:   mm spasms which caused pt to not be able to sit and she had burning sensation outside of vagina and pain with sexual intercourse.  Pt is taking premarin cream as needed. Pt is not having the spasms since taking premarin.  Pt also uses jet stream in her tub with epsom salts.   2) incomplete urination occurs 60% of the time . Has no issues starting urine stream . Pt strains 5% of the time with bowel movements. Colon resection due to diverticulitis . Pt has to take Miralax and Colace to have bowel movements which now occurs 1-2 x day with Type 4 stool consistency. Pt has not had an infection from diverticulitis for 1.5 years.    3 )  R shoulder pain - 5/10 pain occurs inconsistently. The past 2 weeks, she notices it hurts putting hand behind back and raising arm . Radiates with neck to acromium ( pt points) .  Pt and husband lift weights on Youtube but stopped due to shoulder pain.    4)  R sciatic pain- occurs 4-5/10 pain with sitting for 4-5 hours, standing > 30 min , radiates to side of thigh to knee   5) Feet pain:  Hx  broken bones in R foot 2024, L 2023.  Pt declined wearing a boot and surgery.  Feet pain occurs after walking 2 miles and she wakes up with an ache in B foot.  Pt has to wear a sleeve on a foot all the time when exercising and walking but in the house.  Pt walks barefoot in the house. Pt wears orthopedic inserts in her tennis shoes.   PERTINENT HISTORY:  Osteoporosis and gets Tx every 6 months  Colon resection due to diverticulitis  Abdominal surgeries for multiple ovarian cyst removals, hysterectomy, bladder tack One vaginal delivery with episiotomy  Heart attack 2016 with nitro/ still takes medication     Walks with husband 1-2 miles everyday , workout:  crunches/ sit ups    PAIN:  Are you having pain? Yes: see above   PRECAUTIONS: No  WEIGHT BEARING RESTRICTIONS:  No   FALLS:  Has patient fallen in last 6 months? Yes because of the ringing to her ears which affected her balance. Had ringing in her ears for 22+  years  LIVING ENVIRONMENT: Lives with: husband  Lives in: two story house  Stairs: a flight  Has following equipment at home:   OCCUPATION: retired Psychologist, counselling    PLOF: IND  PATIENT GOALS:  Improve pain and walking with more balance   OBJECTIVE:     [facility] PT Assessment - 10/24/23 1010       Coordination   Coordination and Movement Description poor scapular depression and cervical retraction in new HEP by wall, reps done too fast             [facility] Adult PT Treatment/Exercise - 10/24/23 1010       Self-Care   Other Self-Care Comments  discussed continuation of care with balance trianing after d/c, answer pt's question about push ups and sit ups , provided alternatives and explained rational      Therapeutic Activites    Other Therapeutic Activities cued for stand to floor t/f with use of chair and WBing on BUE and pelvic stability to minimize pelvic prolapse      Neuro Re-ed    Neuro Re-ed Details  excessive cues for cervical/ scapular stabilization in new HEP as push up modifications                 HOME EXERCISE PROGRAM: See pt instruction section    ASSESSMENT:  CLINICAL IMPRESSION  Pt has met 100% of  goals and ready for d/c    Improvements: Pt reports she had no pelvic spasms for the past 2 months and able to sit in car without pain Pt also noticed she has had no near falls since starting PT when she used to have several near falls per week. Pt feels more confident with not falling because she is paying more attention to her feet. ( with exception to a cardiac related episode during the week prior to d/c)  Pt demo'd levelled pelvis and shoulder alignment and upright posture which helps with her balance and pelvic issues.  Pt had no pain with sexual intercourse for the first time in 50 years.  Shoulder pain has resolved  IND with alternative to fitness exercises to further promote areas that have improved and to continue with her home fitness program for strengthening and maintaining upright posture and minimize forward head and thoracic kyphosis and LKC deficits.           Whole person and structural alignment  corrections helped to promote optimize IAP system for improved pelvic floor function, trunk stability, gait, balance, stabilization with mobility tasks, minimize sciatica, shoulder, low back pain and minimize fall risk.  Pt is being referred to under falls prevention and balance training after d/c.     OBJECTIVE IMPAIRMENTS decreased activity tolerance, decreased coordination, decreased endurance, decreased mobility, difficulty walking, decreased ROM, decreased strength, decreased safety awareness, hypomobility, increased muscle spasms, impaired flexibility, improper body mechanics, postural dysfunction, and pain. scar restrictions   ACTIVITY LIMITATIONS  self-care,  home chores, work tasks    PARTICIPATION LIMITATIONS:  community, hobbies activities    PERSONAL FACTORS        are also affecting patient's functional outcome.    REHAB POTENTIAL: Good   CLINICAL DECISION MAKING: Evolving/moderate complexity   EVALUATION COMPLEXITY: Moderate    PATIENT EDUCATION:    Education details: Showed pt anatomy images. Explained muscles attachments/ connection, physiology of deep core system/ spinal- thoracic-pelvis-lower kinetic chain as they relate to pt's presentation, Sx, and past Hx. Explained what and how these areas of deficits need to be restored to balance and function    See Therapeutic activity / neuromuscular re-education section  Answered pt's questions.   Person educated: Patient Education method: Explanation, Demonstration, Tactile cues, Verbal cues, and Handouts  Education comprehension: verbalized understanding, returned demonstration, verbal cues required, tactile cues required, and needs further education     PLAN: PT FREQUENCY: 1x/week   PT DURATION: 10 weeks   PLANNED INTERVENTIONS: Therapeutic exercises, Therapeutic activity, Neuromuscular re-education, Balance training, Gait training, Patient/Family education, Self Care, Joint mobilization, Spinal mobilization, Moist  heat, Taping, and Manual therapy, dry needling.   PLAN FOR NEXT SESSION: See clinical impression for plan     GOALS: Goals reviewed with patient? Yes  SHORT TERM GOALS: Target date: 09/19/2023    Pt will demo IND with HEP                    Baseline: Not IND            Goal status: INITIAL   LONG TERM GOALS: Target date: 10/31/2023    1.Pt will demo proper deep core coordination without chest breathing and optimal excursion of diaphragm/pelvic floor in order to promote spinal stability and pelvic floor function  Baseline: dyscoordination Goal status: MET    2.  Pt will demo > 5 pt change on FOTO  to improve QOL and function  PFDI Urinary baseline - 25 Lower score = better function   Pelvic Pain baseline - 42 Lower score = better function   PFDI Bowel - 13 Prolapse - 33 Higher score = better function  Lumber baseline  - 54  Higher score = better function   Goal status: Deferred   3.  Pt will demo proper body mechanics in against gravity tasks and ADLs  work tasks, fitness  to minimize straining pelvic floor / back    Baseline: not IND, improper form that places strain on pelvic floor  Goal status: Ogoing ( 10/10/23: not returned to using 5 and 10 lb weights with fitness due to shoulder pain)    4. Pt will demo increased gait speed > 1.5 m/s with reciprocal gait pattern, longer stride length  in order to ambulate safely in community and return to fitness routine  Baseline: 1.3 m/s , excessive sway to R pelvis, minimal armswings  Goal status: MET ( 10/10/23: 1.78 m/s reciprocal )     5. Pt will demo no pain with  putting hand behind back and raising arm and  radiating pain at acromium in order to return to lifting weights to minimize osteoporosis worsening  Baseline:  Shoulder 5/10 pain occurs inconsistently. The past 2 weeks, she notices it hurts putting hand behind back and raising arm . Radiates with neck to acromium ( pt points)  Goal status: MET  ( 10/10/23:   1-2/10 pain)  ( 10/24/23: no pain)    6. Pt will report no sciatic pain across 2 weeks , sitting for 4-5 hours one trip, standing > 30 min with no radiating pain , < 2/10  Baseline: R sciatica occurs 4-5/10 pain with sitting for 4-5 hours, standing > 30 min , radiates to side of thigh to knee  Goal status:  MET  ( 10/10/23: no radiating pain, able to stand > 30 min.  Pt has not travelled for a long car ride to know about pain)   7. Pt will demo levelled pelvic girdle and shoulder height in order to progress to deep core strengthening HEP and restore mobility at spine, pelvis, gait, posture minimize falls, and improve balance   Baseline: L shoulder, R iliac crest lowered Goal Status: MET   8. Decrease FTST by >5 sec change  Baseline:16.01 sec no UE support  Goal Status: MET ( 10/10/23:  9.50 sec)    Mariane Masters, PT 08/22/2023, 9:48 AM

## 2023-10-29 ENCOUNTER — Encounter: Payer: Medicare Other | Admitting: Physical Therapy

## 2023-10-31 ENCOUNTER — Ambulatory Visit: Payer: Medicare Other | Admitting: Physical Therapy

## 2023-11-12 ENCOUNTER — Encounter: Payer: Medicare Other | Admitting: Physical Therapy

## 2023-12-03 ENCOUNTER — Encounter: Payer: Medicare Other | Admitting: Physical Therapy

## 2023-12-10 ENCOUNTER — Encounter: Payer: Medicare Other | Admitting: Physical Therapy

## 2023-12-10 ENCOUNTER — Ambulatory Visit

## 2023-12-11 NOTE — Therapy (Signed)
 OUTPATIENT PHYSICAL THERAPY NEURO EVALUATION   Patient Name: Debra Moody MRN: [id number] DOB:May 15, 1941, 83 y.o., female Today's Date: 12/12/2023   PCP: Sari Cunning, MD REFERRING PROVIDER: Sari Cunning, MD  END OF SESSION:  PT End of Session - 12/12/23 1623     Visit Number 1    Number of Visits 17    Date for PT Re-Evaluation 02/06/24    PT Start Time 1534    PT Stop Time 1616    PT Time Calculation (min) 42 min    Equipment Utilized During Treatment Gait belt    Activity Tolerance Patient tolerated treatment well    Behavior During Therapy WFL for tasks assessed/performed             Past Medical History:  Diagnosis Date   Actinic keratosis 11/09/2008   L mid dorsum lat forearm - bx proven   Basal cell carcinoma 11/11/2007   R top of shoulder   Basal cell carcinoma 11/24/2013   L sup med scapula   Basal cell carcinoma 12/22/2013   Mid back spinal    Complication of anesthesia    Gas at dentist office "made me feel like I was going to die".   Diverticulitis    Dysplastic nevus    left forearm   Family history of adverse reaction to anesthesia    Mother - Rash   GERD (gastroesophageal reflux disease)    History of shingles 2015   Hyperlipidemia    Hypertension    MI (myocardial infarction) (HCC) 11/03/2014   Severe damage to the left ventricle    Orthostatic hypotension    Osteoporosis    bone density test done 2019   Skin cancer, basal cell    Past Surgical History:  Procedure Laterality Date   ABDOMINAL HYSTERECTOMY     bladder tack     CARDIAC CATHETERIZATION  11/03/2014   CATARACT EXTRACTION W/PHACO Left 09/20/2021   Procedure: CATARACT EXTRACTION PHACO AND INTRAOCULAR LENS PLACEMENT (IOC) LEFT 5.68 00:56.7;  Surgeon: Annell Kidney, MD;  Location: [facility];  Service: Ophthalmology;  Laterality: Left;   COLONOSCOPY WITH PROPOFOL  N/A 07/01/2017   Procedure: COLONOSCOPY WITH PROPOFOL ;  Surgeon: Cassie Click, MD;  Location:  [facility];  Service: Endoscopy;  Laterality: N/A;   GUM SURGERY     Patient Active Problem List   Diagnosis Date Noted   Shortness of breath 10/22/2017   Coronary artery disease involving native coronary artery of native heart without angina pectoris 09/05/2016   Orthostatic hypotension 03/07/2015   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   Anxiety 12/01/2014   Takotsubo syndrome 12/01/2014   Hyperlipidemia 12/01/2014    ONSET DATE: a couple years ago  REFERRING DIAG:  R42 (ICD-10-CM) - Dysequilibrium      THERAPY DIAG:  Unsteadiness on feet  Muscle weakness (generalized)  Rationale for Evaluation and Treatment: Rehabilitation  SUBJECTIVE:  SUBJECTIVE STATEMENT: The pt is a pleasant 83 y/o female referred to PT for dysequilibrium (recently working with Pelvic PT). Pt reports 1 fall prior to referral and prior to being taken off of amlodipine. Prior to removal of this med by her physician she was experiencing dizziness & syncope. While pt has not fallen since she reports balance is not as good as it should be (she reports Pelvic PT did help this) and she has had two dizzy spells. During these spells she reports difficulty with mobility for a minute and some low-BP like symptoms affecting her vision. She also notes some weakness. She currently helps take care of her husband who is ill and reports stress also impacts her.  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 Pt thinks her balance on even surfaces is OK, but might have some difficulty with uneven surfaces (such as walking on pebbles) and she notes difficulty with stairs.  Pt accompanied by: self  PERTINENT HISTORY:   PMH significant for diverticulitis, basal cell carcinoma, hx  of shingle, HTN, orthostatic hypotension, MI, osteoporosis  PAIN:  Are you having pain? No but can have headaches (she is unsure of cause)  PRECAUTIONS: Fall  RED FLAGS: None   WEIGHT BEARING RESTRICTIONS: No  FALLS: Has patient fallen in last 6 months? Yes. Number of falls 3  LIVING ENVIRONMENT: Lives with: lives with their spouse Stairs:  4 steps to get into her home, 1-2 rails depending on entrance but far apar   PLOF: Independent  PATIENT GOALS: Would like to improve core strength, doesn't want to fall anymore, get back to yoga   OBJECTIVE:  Note: Objective measures were completed at Evaluation unless otherwise noted.  DIAGNOSTIC FINDINGS:   No recent pertinent imaging available in chart   COGNITION: Overall cognitive status: Within functional limits for tasks assessed   SENSATION: Tingling in L thumb due to procedure, numbness/sensation change R thigh to knee due to sciatic issues . Sometimes she has some issues with her feet, improved with new shoe inserts   COORDINATION: WNL BLE rapid alt movement and heel>shin     POSTURE: No Significant postural limitations    (Blank rows = not tested)  LOWER EXTREMITY MMT:    MMT Right Eval Left Eval  Hip flexion 4 4  Hip extension    Hip abduction 4- 4  Hip adduction 4+ 4+  Hip internal rotation    Hip external rotation    Knee flexion 5 5  Knee extension 5 5  Ankle dorsiflexion 5 5  Ankle plantarflexion 5 5  Ankle inversion    Ankle eversion    (Blank rows = not tested)   TRANSFERS: indep  RAMP:  Impaired per ABC questionnaire  STAIRS: Impaired per ABC questionnaire and hx  GAIT:  Increased variability in BOS with horizontal scanning/head turns. Only tested on firm surface, should assess future on uneven surfacaes   FUNCTIONAL TESTS:  10 meter walk test: 1.6 m/s Dynamic Gait Index: 23/24 MCTSIB: completes all 4 conditions successfully but does exhibit increased sway in conditions 2, 3 and 4  (most unsteady in 4) BERG:  54/56  PATIENT SURVEYS:  ABC scale 59.1  TREATMENT DATE:   NMR: Review of findings from assessment, indications for current functional level & prognosis     PATIENT EDUCATION: Education details: exam findings, plan, goals Person educated: Patient Education method: Explanation Education comprehension: verbalized understanding  HOME EXERCISE PROGRAM: To be initiated next 1-2 visits  GOALS: Goals reviewed with patient? Yes   SHORT TERM GOALS: Target date: 01/09/2024   Patient will be independent in home exercise program to improve balance, strength/mobility for increased safety with ADLs. Baseline: to be initiated next 1-2 visits  Goal status: INITIAL   LONG TERM GOALS: Target date: 02/06/2024  Patient will increase ABC scale score >80% to demonstrate better functional mobility and better confidence with ADLs.  Baseline: 59.1 Goal status: INITIAL   2.  Patient will demonstrate at least 10 seconds or more of SLB on each LE to indicate improved balance for obstacle negotiation and gait. Baseline: currently able to sustain max of 5 sec Goal status: INITIAL  4.  The pt will demonstrate at least 4+/5 bilateral hip abduction and hip flexion strength to indicate increased strength for improved mobility Baseline: hip flexion 4/5 bilat, hip abduction 4-/5 on R and 4/5 L Goal status: INITIAL  5.  The patient will report a return to yoga either at home or by attending a class without LOB or concern for LOB to return to PLOF. Baseline: not currently practicing yoga Goal status: INITIAL    ASSESSMENT:  CLINICAL IMPRESSION: Patient is a pleasant 83 y.o. female who was seen today for physical therapy evaluation and treatment for imbalance. Exam reveals impairments of strength, sensation, gait with scanning, and balance (SLB,  tandem in particular as found on exam). Pt also with decreased balance confidence per ABC scale score. The pt with recent hx of multiple falls and would like to decrease her fall risk and also return to yoga and improve her ability to ascend/descend stairs. The pt will benefit from further skilled PT to improve impairments in order to increase strength, balance, and decrease fall risk.   OBJECTIVE IMPAIRMENTS: decreased balance, decreased strength, dizziness, impaired sensation, improper body mechanics, and pain.   ACTIVITY LIMITATIONS: carrying, bending, stairs, and locomotion level  PARTICIPATION LIMITATIONS: cleaning, community activity, and yard work  PERSONAL FACTORS: Age, Sex, Time since onset of injury/illness/exacerbation, and 3+ comorbidities: PMH significant for diverticulitis, basal cell carcinoma, hx of shingle, HTN, orthostatic hypotension, MI, osteoporosis  are also affecting patient's functional outcome.   REHAB POTENTIAL: Good  CLINICAL DECISION MAKING: Stable/uncomplicated  EVALUATION COMPLEXITY: Low  PLAN:  PT FREQUENCY: 1-2x/week  PT DURATION: 8 weeks  PLANNED INTERVENTIONS: 97164- PT Re-evaluation, 97750- Physical Performance Testing, 97110-Therapeutic exercises, 97530- Therapeutic activity, [id number]- Neuromuscular re-education, 97535- Self Care, [id number]- Manual therapy, [phone]- Gait training, [phone]- Orthotic Initial, [phone]- Orthotic/Prosthetic subsequent, [phone]- Canalith repositioning, Patient/Family education, Balance training, Stair training, Taping, Dry Needling, Joint mobilization, Spinal mobilization, Vestibular training, DME instructions, Wheelchair mobility training, Cryotherapy, and Moist heat  PLAN FOR NEXT SESSION: stairs, higher level balance activities, compliant surfaces, strength, HEP   Samie Crews, PT 12/12/2023, 4:44 PM

## 2023-12-12 ENCOUNTER — Ambulatory Visit: Attending: Internal Medicine

## 2023-12-12 DIAGNOSIS — R2689 Other abnormalities of gait and mobility: Secondary | ICD-10-CM | POA: Diagnosis present

## 2023-12-12 DIAGNOSIS — R278 Other lack of coordination: Secondary | ICD-10-CM | POA: Insufficient documentation

## 2023-12-12 DIAGNOSIS — M6281 Muscle weakness (generalized): Secondary | ICD-10-CM

## 2023-12-12 DIAGNOSIS — R293 Abnormal posture: Secondary | ICD-10-CM | POA: Insufficient documentation

## 2023-12-12 DIAGNOSIS — R2681 Unsteadiness on feet: Secondary | ICD-10-CM

## 2023-12-16 ENCOUNTER — Ambulatory Visit

## 2023-12-16 DIAGNOSIS — M6281 Muscle weakness (generalized): Secondary | ICD-10-CM

## 2023-12-16 DIAGNOSIS — R2681 Unsteadiness on feet: Secondary | ICD-10-CM | POA: Diagnosis not present

## 2023-12-16 NOTE — Therapy (Signed)
 OUTPATIENT PHYSICAL THERAPY NEURO TREATMENT   Patient Name: Debra Moody MRN: [id number] DOB:[birth year], 83 y.o., female Today's Date: 12/16/2023   PCP: Sari Cunning, MD REFERRING PROVIDER: Sari Cunning, MD  END OF SESSION:  PT End of Session - 12/16/23 1445     Visit Number 2    Number of Visits 17    Date for PT Re-Evaluation 02/06/24    PT Start Time 1447    PT Stop Time 1530    PT Time Calculation (min) 43 min    Equipment Utilized During Treatment Gait belt    Activity Tolerance Patient tolerated treatment well    Behavior During Therapy WFL for tasks assessed/performed             Past Medical History:  Diagnosis Date   Actinic keratosis 11/09/2008   L mid dorsum lat forearm - bx proven   Basal cell carcinoma 11/11/2007   R top of shoulder   Basal cell carcinoma 11/24/2013   L sup med scapula   Basal cell carcinoma 12/22/2013   Mid back spinal    Complication of anesthesia    Gas at dentist office "made me feel like I was going to die".   Diverticulitis    Dysplastic nevus    left forearm   Family history of adverse reaction to anesthesia    Mother - Rash   GERD (gastroesophageal reflux disease)    History of shingles 2015   Hyperlipidemia    Hypertension    MI (myocardial infarction) (HCC) 11/03/2014   Severe damage to the left ventricle    Orthostatic hypotension    Osteoporosis    bone density test done 2019   Skin cancer, basal cell    Past Surgical History:  Procedure Laterality Date   ABDOMINAL HYSTERECTOMY     bladder tack     CARDIAC CATHETERIZATION  11/03/2014   CATARACT EXTRACTION W/PHACO Left 09/20/2021   Procedure: CATARACT EXTRACTION PHACO AND INTRAOCULAR LENS PLACEMENT (IOC) LEFT 5.68 00:56.7;  Surgeon: Annell Kidney, MD;  Location: [facility];  Service: Ophthalmology;  Laterality: Left;   COLONOSCOPY WITH PROPOFOL  N/A 07/01/2017   Procedure: COLONOSCOPY WITH PROPOFOL ;  Surgeon: Cassie Click, MD;  Location:  [facility];  Service: Endoscopy;  Laterality: N/A;   GUM SURGERY     Patient Active Problem List   Diagnosis Date Noted   Shortness of breath 10/22/2017   Coronary artery disease involving native coronary artery of native heart without angina pectoris 09/05/2016   Orthostatic hypotension 03/07/2015   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 She reports her whole L foot looked bruised, but has since improved. She reports hx of foot fractures. She is unsure what happened though to cause her pain this time. She says she couldn't walk initially, but says it is fine currently. She is wearing a soft brace, says it does hurt if not wearing this when walking. She did walk a mile today and it went OK, but she did have some pain. She says she massaged it and iced it and put it in epsom salt bath.  Pt accompanied by: self  PERTINENT HISTORY:   From eval: The pt is a pleasant 83 y/o female referred to PT for dysequilibrium (recently working with Pelvic PT). Pt reports 1 fall prior to referral and prior to being taken off of amlodipine. Prior to removal of this med by her physician she was experiencing dizziness & syncope. While pt has not fallen since she reports balance is not as good as it should be (she reports Pelvic PT did help this) and she has had two dizzy spells. During these spells she reports  difficulty with mobility for a minute and some low-BP like symptoms affecting her vision. She also notes some weakness. She currently helps take care of her husband who is ill and reports stress also impacts her.  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 Pt thinks her balance on even surfaces is OK, but might have some difficulty with uneven surfaces (such as walking on pebbles) and she notes difficulty with stairs.  PMH significant for diverticulitis, basal cell carcinoma, hx of shingle, HTN, orthostatic hypotension, MI, osteoporosis  PAIN:  Are you having pain? No but can have headaches (she is unsure of cause)  PRECAUTIONS: Fall  RED FLAGS: None   WEIGHT BEARING RESTRICTIONS: No  FALLS: Has patient fallen in last 6 months? Yes. Number of falls 3  LIVING ENVIRONMENT: Lives with: lives with their spouse Stairs:  4 steps to get into her home, 1-2 rails depending on entrance but far apar   PLOF: Independent  PATIENT GOALS: Would like to improve core strength, doesn't want to fall anymore, get back to yoga   OBJECTIVE:  Note: Objective measures were completed at Evaluation unless otherwise noted.  DIAGNOSTIC FINDINGS:   No recent pertinent imaging available in chart   COGNITION: Overall cognitive status: Within functional limits for tasks assessed   SENSATION: Tingling in L thumb due to procedure, numbness/sensation change R thigh to knee due to sciatic issues . Sometimes she has some issues with her feet, improved with new shoe inserts   COORDINATION: WNL BLE rapid alt movement and heel>shin     POSTURE: No Significant postural limitations    (Blank rows = not tested)  LOWER EXTREMITY MMT:    MMT Right Eval Left Eval  Hip flexion 4 4  Hip extension    Hip abduction 4- 4  Hip adduction 4+ 4+  Hip internal rotation    Hip external rotation    Knee flexion 5 5  Knee extension 5 5  Ankle dorsiflexion 5 5   Ankle plantarflexion 5 5  Ankle inversion    Ankle eversion    (Blank rows = not tested)   TRANSFERS: indep  RAMP:  Impaired per ABC questionnaire  STAIRS: Impaired per ABC questionnaire and hx  GAIT:  Increased variability in BOS with horizontal scanning/head turns. Only  tested on firm surface, should assess future on uneven surfacaes   FUNCTIONAL TESTS:  10 meter walk test: 1.6 m/s Dynamic Gait Index: 23/24 MCTSIB: completes all 4 conditions successfully but does exhibit increased sway in conditions 2, 3 and 4 (most unsteady in 4) BERG:  54/56  PATIENT SURVEYS:  ABC scale 59.1                                                                                                                              TREATMENT DATE: 12/16/23   TE: Seated march 2x10 each LE -repeats 2x10 with 3# aw each LE - rates medium  Standing hip abduction 2x15 each LE  Side-lye adduction 2x10 each LE  Side-lye abduction 15x each LE  HEP: provided and reviewed with pt Access Code: [email or web address] URL: [email or web address] Date: 12/16/2023 Prepared by: Aminta Kales  TE & NMR- Exercises - Sidelying Hip Abduction  - 1 x daily - 5-6 x weekly - 2 sets - 15 reps - Sidelying Hip Adduction  - 1 x daily - 5-6 x weekly - 2 sets - 15 reps - Standing Tandem Balance with Counter Support  - 1 x daily - 4-7 x weekly - 2 sets - 1 reps - 30 seconds/leg hold  NMR: Gait belt donned and CGA provided unless otherwise noted At support surface- Tandem stance 2x30 sec each LE - somewhat pain limited with second set, cautioned pt only to continue performing as part of HEP at home as long as foot pain does not continue or does not continue to increase  In // bars- Static lunge 2x30 sec each side -static lunge with twist R and L 10x for each in each LE position -static lunge with overhead reaches, bilat UE abduction 10x each LE position  Tandem gait/semi-tandem gait 5x length of // bars - intermittent  UE support      PATIENT EDUCATION: Education details: exercise technique, HEP Person educated: Patient Education method: Explanation, demo, VC, TC, printout Education comprehension: verbalized understanding, returned demo, requires further instruction  HOME EXERCISE PROGRAM: To be initiated next 1-2 visits  GOALS: Goals reviewed with patient? Yes   SHORT TERM GOALS: Target date: 01/09/2024   Patient will be independent in home exercise program to improve balance, strength/mobility for increased safety with ADLs. Baseline: to be initiated next 1-2 visits  Goal status: INITIAL   LONG TERM GOALS: Target date: 02/06/2024  Patient will increase ABC scale score >80% to demonstrate better functional mobility and better confidence with ADLs.  Baseline: 59.1 Goal status: INITIAL   2.  Patient will demonstrate at least 10 seconds or more of SLB on each LE to indicate improved balance for obstacle negotiation and gait. Baseline: currently able to sustain max of 5 sec Goal status: INITIAL  4.  The pt will demonstrate at least 4+/5 bilateral hip abduction and hip flexion strength to indicate increased strength for improved mobility Baseline: hip flexion 4/5 bilat, hip abduction 4-/5  on R and 4/5 L Goal status: INITIAL  5.  The patient will report a return to yoga either at home or by attending a class without LOB or concern for LOB to return to PLOF. Baseline: not currently practicing yoga Goal status: INITIAL    ASSESSMENT:  CLINICAL IMPRESSION: Initiated HEP, strengthening and balance interventions. Strengthening was modified for HEP to reduce weightbearing through L foot as pt reports some recent foot pain following gardening last Friday. Pt generally tolerated majority of interventions well without pain, but did report some mild pain with tandem stance. This was included in her HEP, but PT instructed pt to discontinue should it continue to bother her foot or increase pain. Pt  verbalized understanding. The pt will benefit from further skilled PT to improve impairments in order to increase strength, balance, and decrease fall risk.   OBJECTIVE IMPAIRMENTS: decreased balance, decreased strength, dizziness, impaired sensation, improper body mechanics, and pain.   ACTIVITY LIMITATIONS: carrying, bending, stairs, and locomotion level  PARTICIPATION LIMITATIONS: cleaning, community activity, and yard work  PERSONAL FACTORS: Age, Sex, Time since onset of injury/illness/exacerbation, and 3+ comorbidities: PMH significant for diverticulitis, basal cell carcinoma, hx of shingle, HTN, orthostatic hypotension, MI, osteoporosis  are also affecting patient's functional outcome.   REHAB POTENTIAL: Good  CLINICAL DECISION MAKING: Stable/uncomplicated  EVALUATION COMPLEXITY: Low  PLAN:  PT FREQUENCY: 1-2x/week  PT DURATION: 8 weeks  PLANNED INTERVENTIONS: 97164- PT Re-evaluation, 97750- Physical Performance Testing, 97110-Therapeutic exercises, 97530- Therapeutic activity, [id number]- Neuromuscular re-education, 97535- Self Care, 16109- Manual therapy, [phone]- Gait training, [phone]- Orthotic Initial, [phone]- Orthotic/Prosthetic subsequent, [phone]- Canalith repositioning, Patient/Family education, Balance training, Stair training, Taping, Dry Needling, Joint mobilization, Spinal mobilization, Vestibular training, DME instructions, Wheelchair mobility training, Cryotherapy, and Moist heat  PLAN FOR NEXT SESSION: stairs, higher level balance activities, compliant surfaces, strength, yoga-balance interventions    Samie Crews, PT 12/16/2023, 4:32 PM

## 2023-12-17 ENCOUNTER — Ambulatory Visit (INDEPENDENT_AMBULATORY_CARE_PROVIDER_SITE_OTHER): Payer: Medicare Other | Admitting: Dermatology

## 2023-12-17 ENCOUNTER — Encounter: Payer: Self-pay | Admitting: Dermatology

## 2023-12-17 ENCOUNTER — Ambulatory Visit: Admitting: Physical Therapy

## 2023-12-17 DIAGNOSIS — D1801 Hemangioma of skin and subcutaneous tissue: Secondary | ICD-10-CM

## 2023-12-17 DIAGNOSIS — Z1283 Encounter for screening for malignant neoplasm of skin: Secondary | ICD-10-CM | POA: Diagnosis not present

## 2023-12-17 DIAGNOSIS — L209 Atopic dermatitis, unspecified: Secondary | ICD-10-CM

## 2023-12-17 DIAGNOSIS — W908XXA Exposure to other nonionizing radiation, initial encounter: Secondary | ICD-10-CM

## 2023-12-17 DIAGNOSIS — Z86018 Personal history of other benign neoplasm: Secondary | ICD-10-CM

## 2023-12-17 DIAGNOSIS — D229 Melanocytic nevi, unspecified: Secondary | ICD-10-CM

## 2023-12-17 DIAGNOSIS — L821 Other seborrheic keratosis: Secondary | ICD-10-CM

## 2023-12-17 DIAGNOSIS — Z7189 Other specified counseling: Secondary | ICD-10-CM

## 2023-12-17 DIAGNOSIS — L814 Other melanin hyperpigmentation: Secondary | ICD-10-CM

## 2023-12-17 DIAGNOSIS — Z79899 Other long term (current) drug therapy: Secondary | ICD-10-CM

## 2023-12-17 DIAGNOSIS — L578 Other skin changes due to chronic exposure to nonionizing radiation: Secondary | ICD-10-CM | POA: Diagnosis not present

## 2023-12-17 DIAGNOSIS — Z85828 Personal history of other malignant neoplasm of skin: Secondary | ICD-10-CM

## 2023-12-17 DIAGNOSIS — L719 Rosacea, unspecified: Secondary | ICD-10-CM

## 2023-12-17 DIAGNOSIS — Z872 Personal history of diseases of the skin and subcutaneous tissue: Secondary | ICD-10-CM

## 2023-12-17 MED ORDER — TRIAMCINOLONE ACETONIDE 0.1 % EX OINT: TOPICAL_OINTMENT | CUTANEOUS | 1 refills | Status: AC

## 2023-12-17 NOTE — Patient Instructions (Addendum)
 Recommend Urea 40 % cream to use for rough / itchy, dry skin (10 to 20%) areas on body can be found online    For Eczema can use triamcinolone ointment to back as needed  Avoid applying to face, groin, and axilla. Use as directed. Long-term use can cause thinning of the skin.  Topical steroids (such as triamcinolone, fluocinolone, fluocinonide, mometasone , clobetasol, halobetasol, betamethasone, hydrocortisone) can cause thinning and lightening of the skin if they are used for too long in the same area. Your physician has selected the right strength medicine for your problem and area affected on the body. Please use your medication only as directed by your physician to prevent side effects.   Gentle Skin Care Guide  1. Bathe no more than once a day.  2. Avoid bathing in hot water  3. Use a mild soap like Dove, Vanicream, Cetaphil, CeraVe. Can use Lever 2000 or Cetaphil antibacterial soap  4. Use soap only where you need it. On most days, use it under your arms, between your legs, and on your feet. Let the water rinse other areas unless visibly dirty.  5. When you get out of the bath/shower, use a towel to gently blot your skin dry, don't rub it.  6. While your skin is still a little damp, apply a moisturizing cream such as Vanicream, CeraVe, Cetaphil, Eucerin, Sarna lotion or plain Vaseline Jelly. For hands apply Neutrogena [place] Hand Cream or Excipial Hand Cream.  7. Reapply moisturizer any time you start to itch or feel dry.  8. Sometimes using free and clear laundry detergents can be helpful. Fabric softener sheets should be avoided. Downy Free & Gentle liquid, or any liquid fabric softener that is free of dyes and perfumes, it acceptable to use  9. If your doctor has given you prescription creams you may apply moisturizers over them      Melanoma ABCDEs  Melanoma is the most dangerous type of skin cancer, and is the leading cause of death from skin disease.  You are more  likely to develop melanoma if you: Have light-colored skin, light-colored eyes, or red or blond hair Spend a lot of time in the sun Tan regularly, either outdoors or in a tanning bed Have had blistering sunburns, especially during childhood Have a close family member who has had a melanoma Have atypical moles or large birthmarks  Early detection of melanoma is key since treatment is typically straightforward and cure rates are extremely high if we catch it early.   The first sign of melanoma is often a change in a mole or a new dark spot.  The ABCDE system is a way of remembering the signs of melanoma.  A for asymmetry:  The two halves do not match. B for border:  The edges of the growth are irregular. C for color:  A mixture of colors are present instead of an even brown color. D for diameter:  Melanomas are usually (but not always) greater than 6mm - the size of a pencil eraser. E for evolution:  The spot keeps changing in size, shape, and color.  Please check your skin once per month between visits. You can use a small mirror in front and a large mirror behind you to keep an eye on the back side or your body.   If you see any new or changing lesions before your next follow-up, please call to schedule a visit.  Please continue daily skin protection including broad spectrum sunscreen SPF 30+ to  sun-exposed areas, reapplying every 2 hours as needed when you're outdoors.   Staying in the shade or wearing long sleeves, sun glasses (UVA+UVB protection) and wide brim hats (4-inch brim around the entire circumference of the hat) are also recommended for sun protection.     Due to recent changes in healthcare laws, you may see results of your pathology and/or laboratory studies on MyChart before the doctors have had a chance to review them. We understand that in some cases there may be results that are confusing or concerning to you. Please understand that not all results are received at the  same time and often the doctors may need to interpret multiple results in order to provide you with the best plan of care or course of treatment. Therefore, we ask that you please give us  2 business days to thoroughly review all your results before contacting the office for clarification. Should we see a critical lab result, you will be contacted sooner.   If You Need Anything After Your Visit  If you have any questions or concerns for your doctor, please call our main line at [phone] and press option 4 to reach your doctor's medical assistant. If no one answers, please leave a voicemail as directed and we will return your call as soon as possible. Messages left after 4 pm will be answered the following business day.   You may also send us  a message via MyChart. We typically respond to MyChart messages within 1-2 business days.  For prescription refills, please ask your pharmacy to contact our office. Our fax number is [phone].  If you have an urgent issue when the clinic is closed that cannot wait until the next business day, you can page your doctor at the number below.    Please note that while we do our best to be available for urgent issues outside of office hours, we are not available 24/7.   If you have an urgent issue and are unable to reach us , you may choose to seek medical care at your doctor's office, retail clinic, urgent care center, or emergency room.  If you have a medical emergency, please immediately call 911 or go to the emergency department.  Pager Numbers  - Dr. Bary Likes: [phone]  - Dr. Annette Barters: [phone]  - Dr. Felipe Horton: [phone]   In the event of inclement weather, please call our main line at [phone] for an update on the status of any delays or closures.  Dermatology Medication Tips: Please keep the boxes that topical medications come in in order to help keep track of the instructions about where and how to use these. Pharmacies typically  print the medication instructions only on the boxes and not directly on the medication tubes.   If your medication is too expensive, please contact our office at [phone] option 4 or send us  a message through MyChart.   We are unable to tell what your co-pay for medications will be in advance as this is different depending on your insurance coverage. However, we may be able to find a substitute medication at lower cost or fill out paperwork to get insurance to cover a needed medication.   If a prior authorization is required to get your medication covered by your insurance company, please allow us  1-2 business days to complete this process.  Drug prices often vary depending on where the prescription is filled and some pharmacies may offer cheaper prices.  The website www.goodrx.com contains coupons for medications through different pharmacies.  The prices here do not account for what the cost may be with help from insurance (it may be cheaper with your insurance), but the website can give you the price if you did not use any insurance.  - You can print the associated coupon and take it with your prescription to the pharmacy.  - You may also stop by our office during regular business hours and pick up a GoodRx coupon card.  - If you need your prescription sent electronically to a different pharmacy, notify our office through [facility] or by phone at [phone] option 4.     Si Usted Necesita Algo Despus de Su Visita  Tambin puede enviarnos un mensaje a travs de Clinical cytogeneticist. Por lo general respondemos a los mensajes de MyChart en el transcurso de 1 a 2 das hbiles.  Para renovar recetas, por favor pida a su farmacia que se ponga en contacto con nuestra oficina. Franz Jacks de fax es [place] [phone].  Si tiene un asunto urgente cuando la clnica est cerrada y que no puede esperar hasta el siguiente da hbil, puede llamar/localizar a su doctor(a) al nmero que aparece a  continuacin.   Por favor, tenga en cuenta que aunque hacemos todo lo posible para estar disponibles para asuntos urgentes fuera del horario de [place], no estamos disponibles las 24 horas del da, los 7 [address]  [address] de la [place].   Si tiene un problema urgente y no puede comunicarse con nosotros, puede optar por buscar atencin mdica  en el consultorio de su doctor(a), en una clnica privada, en un centro de atencin urgente o en una sala de emergencias.  Si tiene Engineer, drilling, por favor llame inmediatamente al 911 o vaya a la sala de emergencias.  Nmeros de bper  - Dr. Bary Likes: [phone]  - Dra. Annette Barters: [phone]  - Dr. Felipe Horton: [phone]   En caso de inclemencias del tiempo, por favor llame a Lajuan Pila principal al [phone] para una actualizacin sobre el [place] de cualquier retraso o cierre.  Consejos para la medicacin en dermatologa: Por favor, guarde las cajas en las que vienen los medicamentos de uso tpico para ayudarle a seguir las instrucciones sobre dnde y cmo usarlos. Las farmacias generalmente imprimen las instrucciones del medicamento slo en las cajas y no directamente en los tubos del [place].   Si su medicamento es muy caro, por favor, pngase en contacto con Bettyjane Brunet llamando al [phone] y presione la opcin 4 o envenos un mensaje a travs de Clinical cytogeneticist.   No podemos decirle cul ser su copago por los medicamentos por adelantado ya que esto es diferente dependiendo de la cobertura de su seguro. Sin embargo, es posible que podamos encontrar un medicamento sustituto a Audiological scientist un formulario para que el seguro cubra el medicamento que se considera necesario.   Si se requiere una autorizacin previa para que su compaa de seguros [place] su medicamento, por favor permtanos de 1 a 2 das hbiles para completar este proceso.  Los precios de los medicamentos varan con frecuencia dependiendo del Environmental consultant de dnde se surte la receta  y alguna farmacias pueden ofrecer precios ms baratos.  El sitio web www.goodrx.com tiene cupones para medicamentos de Health and safety inspector. Los precios aqu no tienen en cuenta lo que podra costar con la ayuda del seguro (puede ser ms barato con su seguro), pero el sitio web puede darle el precio si no utiliz Tourist information centre manager.  - Puede imprimir el cupn correspondiente y llevarlo con su  receta a la farmacia.  - Tambin puede pasar por nuestra oficina durante el horario de atencin regular y Education officer, museum una tarjeta de cupones de GoodRx.  - Si necesita que su receta se enve electrnicamente a una farmacia diferente, informe a nuestra oficina a travs de MyChart de [place] o por telfono llamando al [phone] y presione la opcin 4.

## 2023-12-17 NOTE — Progress Notes (Addendum)
 Follow-Up Visit   Subjective  Debra Moody is a 83 y.o. female who presents for the following: Skin Cancer Screening and Full Body Skin Exam Hx of dysplastic nevi, hx of isks, hx of rosacea using skin medicinals cream, hx of bcc, hx of rash / urticaria at back was precribed tmc ointment to use and reports helped with itch. Would like refills.   The patient presents for Total-Body Skin Exam (TBSE) for skin cancer screening and mole check. The patient has spots, moles and lesions to be evaluated, some may be new or changing and the patient may have concern these could be cancer.   The following portions of the chart were reviewed this encounter and updated as appropriate: medications, allergies, medical history  Review of Systems:  No other skin or systemic complaints except as noted in HPI or Assessment and Plan.  Objective  Well appearing patient in no apparent distress; mood and affect are within normal limits.  A full examination was performed including scalp, head, eyes, ears, nose, lips, neck, chest, axillae, abdomen, back, buttocks, bilateral upper extremities, bilateral lower extremities, hands, feet, fingers, toes, fingernails, and toenails. All findings within normal limits unless otherwise noted below.   Relevant physical exam findings are noted in the Assessment and Plan.   Assessment & Plan   SKIN CANCER SCREENING PERFORMED TODAY.  ACTINIC DAMAGE - Chronic condition, secondary to cumulative UV/sun exposure - diffuse scaly erythematous macules with underlying dyspigmentation - Recommend daily broad spectrum sunscreen SPF 30+ to sun-exposed areas, reapply every 2 hours as needed.  - Staying in the shade or wearing long sleeves, sun glasses (UVA+UVB protection) and wide brim hats (4-inch brim around the entire circumference of the hat) are also recommended for sun protection.  - Call for new or changing lesions.  LENTIGINES, SEBORRHEIC KERATOSES, HEMANGIOMAS - Benign normal  skin lesions - Benign-appearing - Call for any changes  MELANOCYTIC NEVI - Tan-brown and/or pink-flesh-colored symmetric macules and papules - Benign appearing on exam today - Observation - Call clinic for new or changing moles - Recommend daily use of broad spectrum spf 30+ sunscreen to sun-exposed areas.   ATOPIC DERMATITIS Exam: Scaly pink papules coalescing to plaques 3% BSA Chronic and persistent condition with duration or expected duration over one year. Condition is symptomatic/ bothersome to patient. Not currently at goal. Atopic dermatitis (eczema) is a chronic, relapsing, pruritic condition that can significantly affect quality of life. It is often associated with allergic rhinitis and/or asthma and can require treatment with topical medications, phototherapy, or in severe cases biologic injectable medication (Dupixent; Adbry) or Oral JAK inhibitors. Treatment Plan: Start Triamcinolone 0.1 % ointment - apply topically qd/bid to itchy areas at back as needed. Avoid applying to face, groin, and axilla. Use as directed. Long-term use can cause thinning of the skin.  Topical steroids (such as triamcinolone, fluocinolone, fluocinonide, mometasone , clobetasol, halobetasol, betamethasone, hydrocortisone) can cause thinning and lightening of the skin if they are used for too long in the same area. Your physician has selected the right strength medicine for your problem and area affected on the body. Please use your medication only as directed by your physician to prevent side effects.   Recommend gentle skin care.  Rosacea Face Rosacea is a chronic progressive skin condition usually affecting the face of adults, causing redness and/or acne bumps. It is treatable but not curable. It sometimes affects the eyes (ocular rosacea) as well. It may respond to topical and/or systemic medication and can flare  with stress, sun exposure, alcohol, exercise, topical steroids (including  hydrocortisone/cortisone 10) and some foods.  Daily application of broad spectrum spf 30+ sunscreen to face is recommended to reduce flares. Counseling for BBL / IPL / Laser and Coordination of Care Discussed the treatment option of Broad Band Light (BBL) /Intense Pulsed Light (IPL)/ Laser for skin discoloration, including brown spots and redness.  Typically we recommend at least 1-3 treatment sessions about 5-8 weeks apart for best results.  Cannot have tanned skin when BBL performed, and regular use of sunscreen is advised after the procedure to help maintain results. The patient's condition may also require "maintenance treatments" in the future.  The fee for BBL / laser treatments is $350 per treatment session for the whole face.  A fee can be quoted for other parts of the body.  Insurance typically does not pay for BBL/laser treatments and therefore the fee is an out-of-pocket cost.   Will continue Skin Medicinals metronidazole /ivermectin/azelaic acid twice daily as needed to affected areas on the face. The patient was advised this is not covered by insurance since it is made by a compounding pharmacy. They will receive an email to check out and the medication will be mailed to their home.  History of Basal Cell Carcinoma of the Skin 11/11/2007 - right top of shoulder  11/24/2013 - left superior medial scapula  12/22/2013 - mid back spinal  - No evidence of recurrence today - Recommend regular full body skin exams - Recommend daily broad spectrum sunscreen SPF 30+ to sun-exposed areas, reapply every 2 hours as needed.  - Call if any new or changing lesions are noted between office visits   History of Dysplastic Nevi Left forearm  - No evidence of recurrence today - Recommend regular full body skin exams - Recommend daily broad spectrum sunscreen SPF 30+ to sun-exposed areas, reapply every 2 hours as needed.  - Call if any new or changing lesions are noted between office visits  ATOPIC  DERMATITIS, UNSPECIFIED TYPE   Related Medications mometasone  (ELOCON ) 0.1 % cream Apply to rash on chest QD-BID PRN flares. triamcinolone ointment (KENALOG) 0.1 % Apply topically to affected areas of itch at back qd/bid prn Avoid applying to face, groin, and axilla. Use as directed. SKIN CANCER SCREENING   HISTORY OF BASAL CELL CARCINOMA   ACTINIC SKIN DAMAGE   HISTORY OF DYSPLASTIC NEVUS   LENTIGO   MELANOCYTIC NEVUS, UNSPECIFIED LOCATION   COUNSELING AND COORDINATION OF CARE   MEDICATION MANAGEMENT   Return in about 1 year (around 12/16/2024) for TBSE.  IRandee Busing, CMA, am acting as scribe for Celine Collard, MD.   Documentation: I have reviewed the above documentation for accuracy and completeness, and I agree with the above.  Celine Collard, MD

## 2023-12-24 ENCOUNTER — Ambulatory Visit: Admitting: Physical Therapy

## 2023-12-30 NOTE — Therapy (Signed)
 OUTPATIENT PHYSICAL THERAPY NEURO TREATMENT   Patient Name: Debra Moody MRN: [id number] DOB:04-06-1941, 83 y.o., female Today's Date: 12/31/2023   PCP: Sari Cunning, MD REFERRING PROVIDER: Sari Cunning, MD  END OF SESSION:   PT End of Session - 12/31/23 1101     Visit Number 3    Number of Visits 17    Date for PT Re-Evaluation 02/06/24    PT Start Time 1104    PT Stop Time 1144    PT Time Calculation (min) 40 min    Equipment Utilized During Treatment Gait belt    Activity Tolerance Patient tolerated treatment well;No increased pain    Behavior During Therapy WFL for tasks assessed/performed              Past Medical History:  Diagnosis Date   Actinic keratosis 11/09/2008   L mid dorsum lat forearm - bx proven   Basal cell carcinoma 11/11/2007   R top of shoulder   Basal cell carcinoma 11/24/2013   L sup med scapula   Basal cell carcinoma 12/22/2013   Mid back spinal    Complication of anesthesia    Gas at dentist office "made me feel like I was going to die".   Diverticulitis    Dysplastic nevus    left forearm   Family history of adverse reaction to anesthesia    Mother - Rash   GERD (gastroesophageal reflux disease)    History of shingles 2015   Hyperlipidemia    Hypertension    MI (myocardial infarction) (HCC) 11/03/2014   Severe damage to the left ventricle    Orthostatic hypotension    Osteoporosis    bone density test done 2019   Skin cancer, basal cell    Past Surgical History:  Procedure Laterality Date   ABDOMINAL HYSTERECTOMY     bladder tack     CARDIAC CATHETERIZATION  11/03/2014   CATARACT EXTRACTION W/PHACO Left 09/20/2021   Procedure: CATARACT EXTRACTION PHACO AND INTRAOCULAR LENS PLACEMENT (IOC) LEFT 5.68 00:56.7;  Surgeon: Annell Kidney, MD;  Location: [facility];  Service: Ophthalmology;  Laterality: Left;   COLONOSCOPY WITH PROPOFOL  N/A 07/01/2017   Procedure: COLONOSCOPY WITH PROPOFOL ;  Surgeon: Cassie Click, MD;  Location: [facility];  Service: Endoscopy;  Laterality: N/A;   GUM SURGERY     Patient Active Problem List   Diagnosis Date Noted   Shortness of breath 10/22/2017   Coronary artery disease involving native coronary artery of native heart without angina pectoris 09/05/2016   Orthostatic hypotension 03/07/2015   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   Anxiety 12/01/2014   Takotsubo syndrome 12/01/2014   Hyperlipidemia 12/01/2014    ONSET DATE: a couple years ago  REFERRING DIAG:  R42 (ICD-10-CM) - Dysequilibrium      THERAPY DIAG:  Unsteadiness on feet  Muscle weakness (generalized)  Other abnormalities of gait and mobility  Abnormal posture  Other lack of coordination  Rationale for Evaluation and Treatment: Rehabilitation  SUBJECTIVE:  SUBJECTIVE STATEMENT:  Pt reports she has been doing good, but her husband had an Addison crisis this past weekend having to take him to ER, which caused her a significant amount of emotional stress because his quality of care was not good. Pt states she has been falling due to the stress.  Pt states she wants some exercises she can do for strength and balance in addition to complementing what she was doing with pelvic floor PT. Pt states she does diligently perform her HEPs.    Pt reports she is continuing to have bruising of L lateral foot and states the tandem stance causes increased pain.   Pt accompanied by: self  PERTINENT HISTORY:   From eval: The pt is a pleasant 83 y/o female referred to PT for dysequilibrium (recently working with Pelvic PT). Pt reports 1 fall prior to referral and prior to being taken off of amlodipine. Prior to removal of this med by her physician she was experiencing dizziness & syncope. While  pt has not fallen since she reports balance is not as good as it should be (she reports Pelvic PT did help this) and she has had two dizzy spells. During these spells she reports difficulty with mobility for a minute and some low-BP like symptoms affecting her vision. She also notes some weakness. She currently helps take care of her husband who is ill and reports stress also impacts her.  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 Pt thinks her balance on even surfaces is OK, but might have some difficulty with uneven surfaces (such as walking on pebbles) and she notes difficulty with stairs.  PMH significant for diverticulitis, basal cell carcinoma, hx of shingle, HTN, orthostatic hypotension, MI, osteoporosis  PAIN:  Are you having pain? No but can have headaches (she is unsure of cause)  PRECAUTIONS: Fall  RED FLAGS: None   WEIGHT BEARING RESTRICTIONS: No  FALLS: Has patient fallen in last 6 months? Yes. Number of falls 3  LIVING ENVIRONMENT: Lives with: lives with their spouse Stairs: 4 steps to get into her home, 1-2 rails depending on entrance but far apar   PLOF: Independent  PATIENT GOALS: Would like to improve core strength, doesn't want to fall anymore, get back to yoga   OBJECTIVE:  Note: Objective measures were completed at Evaluation unless otherwise noted.  DIAGNOSTIC FINDINGS:   No recent pertinent imaging available in chart   COGNITION: Overall cognitive status: Within functional limits for tasks assessed   SENSATION: Tingling in L thumb due to procedure, numbness/sensation change R thigh to knee due to sciatic issues. Sometimes she has some issues with her feet, improved with new shoe inserts   COORDINATION: WNL BLE rapid alt movement and heel>shin     POSTURE: No Significant postural limitations    (Blank rows = not tested)  LOWER EXTREMITY MMT:    MMT Right Eval Left Eval  Hip flexion 4 4  Hip  extension    Hip abduction 4- 4  Hip adduction 4+ 4+  Hip internal rotation    Hip external rotation    Knee flexion 5 5  Knee extension 5 5  Ankle dorsiflexion 5 5  Ankle plantarflexion 5 5  Ankle inversion    Ankle eversion    (Blank rows = not tested)   TRANSFERS: indep  RAMP:  Impaired per ABC questionnaire  STAIRS: Impaired per ABC questionnaire and hx  GAIT:  Increased variability in  BOS with horizontal scanning/head turns. Only tested on firm surface, should assess future on uneven surfacaes   FUNCTIONAL TESTS:  10 meter walk test: 1.6 m/s Dynamic Gait Index: 23/24 MCTSIB: completes all 4 conditions successfully but does exhibit increased sway in conditions 2, 3 and 4 (most unsteady in 4) BERG:  54/56  PATIENT SURVEYS:  ABC scale 59.1                                                                                                                              TREATMENT DATE: 12/31/23   Pt denies dizziness episodes with rolling over in bed nor going sit>supine. Pt reports only having 1x dizziness episode this past week, which pt reports was stress induced. Pt states she only has a few episodes of orthostatic hypotension when she stands up too quickly, but is not of concern at this time.  Performed the below exercises to target balance and functional B LE strength to add to patient's HEP:  Single leg stance x30 sec each LE with pt denying pain with this compared to tandem stance This replaced tandem stance Side stepping starting with RTB resistance and increased to GTB resistance around knees down/back x 4 laps (~10 steps each direction) Provided pt with GTB  Static lunge with gentle twist R and L 10x for each in each LE position Reviewed sidelying hip abduction and hip adduction with pt reporting she can feel it in her R SI joint/PSIS area, which pt reports feels like her sciatica  Therapist educated pt to monitor her symptoms with this and report back if anything  increases her pain/discomfort  Provided updated HEP printout as described below.  Educated pt to monitor if/when she feels imbalance and to report back to therapist to determine if there are additional/alternate interventions that pt would benefit from.   PATIENT EDUCATION: Education details: exercise technique, HEP Person educated: Patient Education method: Explanation, demo, VC, TC, printout Education comprehension: verbalized understanding, returned demo, requires further instruction  HOME EXERCISE PROGRAM:  Access Code: [email or web address] URL: [email or web address] Date: 12/31/2023 Prepared by: Carlen Chasten  Exercises - Sidelying Hip Abduction  - 1 x daily - 5-6 x weekly - 2 sets - 15 reps - Sidelying Hip Adduction  - 1 x daily - 5-6 x weekly - 2 sets - 15 reps - Standing Single Leg Stance with Counter Support  - 1 x daily - 7 x weekly - 2 sets - 30 seconds hold - Forward Lunge with Back Leg Straight and Counter Support  - 1 x daily - 7 x weekly - 2 sets - 10 reps - 30 seconds hold - Side Stepping with Resistance at Thighs and Counter Support  - 1 x daily - 7 x weekly - 3 sets - 10 reps   GOALS: Goals reviewed with patient? Yes   SHORT TERM GOALS: Target date: 01/09/2024   Patient will be independent in home exercise program to improve balance, strength/mobility for  increased safety with ADLs. Baseline: to be initiated next 1-2 visits  Goal status: INITIAL   LONG TERM GOALS: Target date: 02/06/2024  Patient will increase ABC scale score >80% to demonstrate better functional mobility and better confidence with ADLs.  Baseline: 59.1 Goal status: INITIAL   2.  Patient will demonstrate at least 10 seconds or more of SLB on each LE to indicate improved balance for obstacle negotiation and gait. Baseline: currently able to sustain max of 5 sec Goal status: INITIAL  4.  The pt will demonstrate at least 4+/5 bilateral hip abduction and hip flexion strength to indicate  increased strength for improved mobility Baseline: hip flexion 4/5 bilat, hip abduction 4-/5 on R and 4/5 L Goal status: INITIAL  5.  The patient will report a return to yoga either at home or by attending a class without LOB or concern for LOB to return to PLOF. Baseline: not currently practicing yoga Goal status: INITIAL    ASSESSMENT:  CLINICAL IMPRESSION:  Patient reports significant emotional stress following recent ED visit with her husband and she is grateful he is doing better; however, she was severely upset with the poor quality of care he received. Despite this, pt remains highly motivated to participate in therapy and is diligent with performing her HEP. Therapy session focused on updating her HEP to include exercises focusing on standing balance and functional B LE strengthening. Patient performed 1-2 sets of each of the new exercises demonstrating excellent understanding and technique following cuing. Patient continues to demo mild R hip weakness compared to L with pt reporting hx of R LE sciatica. Therapist educated pt to make note of when she feels imbalance at home to allow further adjustment/addition of interventions to address her specific deficits. The pt will benefit from further skilled PT to improve impairments in order to increase strength, balance, and decrease fall risk.   OBJECTIVE IMPAIRMENTS: decreased balance, decreased strength, dizziness, impaired sensation, improper body mechanics, and pain.   ACTIVITY LIMITATIONS: carrying, bending, stairs, and locomotion level  PARTICIPATION LIMITATIONS: cleaning, community activity, and yard work  PERSONAL FACTORS: Age, Sex, Time since onset of injury/illness/exacerbation, and 3+ comorbidities: PMH significant for diverticulitis, basal cell carcinoma, hx of shingle, HTN, orthostatic hypotension, MI, osteoporosis are also affecting patient's functional outcome.   REHAB POTENTIAL: Good  CLINICAL DECISION MAKING:  Stable/uncomplicated  EVALUATION COMPLEXITY: Low  PLAN:  PT FREQUENCY: 1-2x/week  PT DURATION: 8 weeks  PLANNED INTERVENTIONS: 97164- PT Re-evaluation, 97750- Physical Performance Testing, 97110-Therapeutic exercises, 97530- Therapeutic activity, [id number]- Neuromuscular re-education, 97535- Self Care, 16109- Manual therapy, [phone]- Gait training, [phone]- Orthotic Initial, [phone]- Orthotic/Prosthetic subsequent, [phone]- Canalith repositioning, Patient/Family education, Balance training, Stair training, Taping, Dry Needling, Joint mobilization, Spinal mobilization, Vestibular training, DME instructions, Wheelchair mobility training, Cryotherapy, and Moist heat  PLAN FOR NEXT SESSION:  - review updated HEP and advance as appropriate - R LE hip mildly weaker than L  stairs, higher level balance activities, compliant surfaces, strength, yoga-balance interventions    Kayzen Kendzierski, PT, DPT, NCS, CSRS Physical Therapist - [place]  [facility]  12:57 PM 12/31/23

## 2023-12-31 ENCOUNTER — Ambulatory Visit: Admitting: Physical Therapy

## 2023-12-31 DIAGNOSIS — R278 Other lack of coordination: Secondary | ICD-10-CM

## 2023-12-31 DIAGNOSIS — R2681 Unsteadiness on feet: Secondary | ICD-10-CM

## 2023-12-31 DIAGNOSIS — R293 Abnormal posture: Secondary | ICD-10-CM

## 2023-12-31 DIAGNOSIS — R2689 Other abnormalities of gait and mobility: Secondary | ICD-10-CM

## 2023-12-31 DIAGNOSIS — M6281 Muscle weakness (generalized): Secondary | ICD-10-CM

## 2024-01-03 ENCOUNTER — Ambulatory Visit: Admitting: Physical Therapy

## 2024-01-03 DIAGNOSIS — R278 Other lack of coordination: Secondary | ICD-10-CM

## 2024-01-03 DIAGNOSIS — R2681 Unsteadiness on feet: Secondary | ICD-10-CM | POA: Diagnosis not present

## 2024-01-03 DIAGNOSIS — R293 Abnormal posture: Secondary | ICD-10-CM

## 2024-01-03 DIAGNOSIS — M6281 Muscle weakness (generalized): Secondary | ICD-10-CM

## 2024-01-03 DIAGNOSIS — R2689 Other abnormalities of gait and mobility: Secondary | ICD-10-CM

## 2024-01-03 NOTE — Therapy (Signed)
 OUTPATIENT PHYSICAL THERAPY NEURO TREATMENT   Patient Name: Debra Moody MRN: [id number] DOB:04-Nov-1940, 83 y.o., female Today's Date: 01/03/2024   PCP: Sari Cunning, MD REFERRING PROVIDER: Sari Cunning, MD  END OF SESSION:   PT End of Session - 01/03/24 0932     Visit Number 4    Number of Visits 17    Date for PT Re-Evaluation 02/06/24    PT Start Time 0937    PT Stop Time 1019    PT Time Calculation (min) 42 min    Equipment Utilized During Treatment Gait belt    Activity Tolerance Patient tolerated treatment well;No increased pain    Behavior During Therapy WFL for tasks assessed/performed               Past Medical History:  Diagnosis Date   Actinic keratosis 11/09/2008   L mid dorsum lat forearm - bx proven   Basal cell carcinoma 11/11/2007   R top of shoulder   Basal cell carcinoma 11/24/2013   L sup med scapula   Basal cell carcinoma 12/22/2013   Mid back spinal    Complication of anesthesia    Gas at dentist office "made me feel like I was going to die".   Diverticulitis    Dysplastic nevus    left forearm   Family history of adverse reaction to anesthesia    Mother - Rash   GERD (gastroesophageal reflux disease)    History of shingles 2015   Hyperlipidemia    Hypertension    MI (myocardial infarction) (HCC) 11/03/2014   Severe damage to the left ventricle    Orthostatic hypotension    Osteoporosis    bone density test done 2019   Skin cancer, basal cell    Past Surgical History:  Procedure Laterality Date   ABDOMINAL HYSTERECTOMY     bladder tack     CARDIAC CATHETERIZATION  11/03/2014   CATARACT EXTRACTION W/PHACO Left 09/20/2021   Procedure: CATARACT EXTRACTION PHACO AND INTRAOCULAR LENS PLACEMENT (IOC) LEFT 5.68 00:56.7;  Surgeon: Annell Kidney, MD;  Location: [facility];  Service: Ophthalmology;  Laterality: Left;   COLONOSCOPY WITH PROPOFOL  N/A 07/01/2017   Procedure: COLONOSCOPY WITH PROPOFOL ;  Surgeon: Cassie Click, MD;  Location: [facility] END[facility];  Service: Endoscopy;  Laterality: N/A;   GUM SURGERY     Patient Active Problem List   Diagnosis Date Noted   Shortness of breath 10/22/2017   Coronary artery disease involving native coronary artery of native heart without angina pectoris 09/05/2016   Orthostatic hypotension 03/07/2015   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 States yesterday after walking she had to put on soft ankle braces because her feet where hurting so bad she could hardly walk, but they feel better this morning. States she gets massages 1x/month.   Pt states she really feels her feet and ankles are the primary reason she has LOB.    Pt states she has had to cancel some appointments in June due to her husband needing follow-up medical care from his recent ER visit.   Pt accompanied by: self  PERTINENT HISTORY:   From eval: The pt is a pleasant 83 y/o female referred to PT for dysequilibrium (recently working with Pelvic PT). Pt reports 1 fall prior to referral and prior to being taken off of amlodipine. Prior to removal of this med by her physician she was experiencing dizziness & syncope. While pt has not fallen since she reports balance is not as good as it should be (she reports Pelvic  PT did help this) and she has had two dizzy spells. During these spells she reports difficulty with mobility for a minute and some low-BP like symptoms affecting her vision. She also notes some weakness. She currently helps take care of her husband who is ill and reports stress also impacts her.  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 Pt thinks her balance on even surfaces is OK, but might have some difficulty with uneven surfaces (such as walking on pebbles) and she notes difficulty with stairs.  PMH significant for diverticulitis, basal cell carcinoma, hx of shingle, HTN, orthostatic hypotension, MI, osteoporosis  PAIN:  Are you having pain? No but can have headaches (she is unsure of cause)  PRECAUTIONS: Fall  RED FLAGS: None   WEIGHT BEARING RESTRICTIONS: No  FALLS: Has patient fallen in last 6 months? Yes. Number of falls 3  LIVING ENVIRONMENT: Lives with: lives with their spouse Stairs: 4 steps to get into her home, 1-2 rails depending on entrance but far apar   PLOF: Independent  PATIENT GOALS: Would like to improve core strength, doesn't want to fall anymore, get back to yoga   OBJECTIVE:  Note: Objective measures were completed at Evaluation unless otherwise noted.  DIAGNOSTIC FINDINGS:   No recent pertinent imaging available in chart   COGNITION: Overall cognitive status: Within functional limits for tasks assessed   SENSATION: Tingling in L thumb due to procedure, numbness/sensation change R thigh to knee due to sciatic issues. Sometimes she has some issues with her feet, improved with new shoe inserts   COORDINATION: WNL BLE rapid alt movement and heel>shin     POSTURE: No Significant postural limitations    (Blank rows = not tested)  LOWER EXTREMITY MMT:    MMT Right Eval Left Eval  Hip flexion 4 4  Hip extension    Hip abduction 4- 4  Hip adduction 4+ 4+  Hip internal rotation    Hip  external rotation    Knee flexion 5 5  Knee extension 5 5  Ankle dorsiflexion 5 5  Ankle plantarflexion 5 5  Ankle inversion    Ankle eversion    (Blank rows = not tested)   TRANSFERS: indep  RAMP:  Impaired per ABC questionnaire  STAIRS: Impaired per ABC questionnaire and hx  GAIT:  Increased variability in BOS with horizontal scanning/head turns. Only tested on firm surface, should assess future on uneven surfacaes  FUNCTIONAL TESTS:  10 meter walk test: 1.6 m/s Dynamic Gait Index: 23/24 MCTSIB: completes all 4 conditions successfully but does exhibit increased sway in conditions 2, 3 and 4 (most unsteady in 4) BERG:  54/56  PATIENT SURVEYS:  ABC scale 59.1                                                                                                                              TREATMENT DATE: 01/03/24  B LE functional strengthening for improved dynamic balance and gait mechanics to manage pt's pain after prolonged walking:  Ankle DF against RTB resistance x12 reps each LE Ankle PF against RTB resistance x12 reps each LE Ankle eversion against RTB x12 reps each LE Educated pt to downgrade to YTB if this causes pain/discomfort as she is most weak in this movement Sidelying clamshell against YTB resistance x20reps each LE Therapist educated pt to progress to use of RTB when appropriate Educated pt to only start adding resistance 2-3x/week as she does this exercise 5days/week Pt places pillow between her knees for this Pt reports she really feels the muscles activating with the resistance Bridges with YTB resistance around knees for increased hip abduction activation x10reps Attempted bridging with slight march; however, pt doesn't have the core/pelvis/hip strength to be successful with this at this time   Provided updated HEP printout as described below. Educated pt to monitor her symptom response to the added resistance and report back at next visit.  Inquired  about stairs and pt states it is her glasses that impact her safety/balance on the stairs due to them being bifocals.   PATIENT EDUCATION: Education details: exercise technique, HEP Person educated: Patient Education method: Explanation, demo, VC, TC, printout Education comprehension: verbalized understanding, returned demo, requires further instruction  HOME EXERCISE PROGRAM:  Access Code: [email or web address] URL: [email or web address] Date: 01/03/2024 Prepared by: Carlen Chasten  Exercises - Supine Bridge with Resistance Band  - 1 x daily - 2-3 x weekly - 2 sets - 10 reps - 2-3 seconds hold - Clamshell with Resistance  - 1 x daily - 2-3 x weekly - 1 sets - 20 reps - Standing Single Leg Stance with Counter Support  - 1 x daily - 7 x weekly - 2 sets - 30 seconds hold - Forward Lunge with Back Leg Straight and Counter Support  - 1 x daily - 5 x weekly - 2 sets - 10 reps - 30 seconds hold - Side Stepping with Resistance at Thighs and Counter Support  - 1 x daily - 3-5 x weekly - 3 sets - 10 reps - Seated Ankle Plantarflexion with Resistance  - 1 x daily - 3-5 x weekly - 2 sets - 12 reps - Seated Ankle Dorsiflexion with Anchored Resistance  - 1 x daily - 3-5 x weekly - 2 sets - 12 reps - Seated Ankle Eversion with Anchored Resistance  - 1 x daily - 3-5 x weekly - 2 sets -  12 reps   GOALS: Goals reviewed with patient? Yes   SHORT TERM GOALS: Target date: 01/09/2024   Patient will be independent in home exercise program to improve balance, strength/mobility for increased safety with ADLs. Baseline: to be initiated next 1-2 visits  Goal status: INITIAL   LONG TERM GOALS: Target date: 02/06/2024  Patient will increase ABC scale score >80% to demonstrate better functional mobility and better confidence with ADLs.  Baseline: 59.1 Goal status: INITIAL   2.  Patient will demonstrate at least 10 seconds or more of SLB on each LE to indicate improved balance for obstacle negotiation  and gait. Baseline: currently able to sustain max of 5 sec Goal status: INITIAL  4.  The pt will demonstrate at least 4+/5 bilateral hip abduction and hip flexion strength to indicate increased strength for improved mobility Baseline: hip flexion 4/5 bilat, hip abduction 4-/5 on R and 4/5 L Goal status: INITIAL  5.  The patient will report a return to yoga either at home or by attending a class without LOB or concern for LOB to return to PLOF. Baseline: not currently practicing yoga Goal status: INITIAL    ASSESSMENT:  CLINICAL IMPRESSION:  Patient remains highly motivated to participate in therapy and is diligent with performing her HEP. Therapy session focused on modifying her HEP to include exercises focusing on functional B LE strengthening specifically targeting ankle and hip weakness to improve her balance and gait mechanics. Patient performed 1 set of each of the new exercises on eahc LE, demonstrating excellent understanding and technique following cuing. Patient continues to demo mild R hip weakness compared to L with pt reporting hx of R LE sciatica. Patient also demonstrates weakness in ankle eversion, which is likely contributing to the instability she has when balancing/walking. The pt will benefit from further skilled PT to improve impairments in order to increase strength, balance, and decrease fall risk.   OBJECTIVE IMPAIRMENTS: decreased balance, decreased strength, dizziness, impaired sensation, improper body mechanics, and pain.   ACTIVITY LIMITATIONS: carrying, bending, stairs, and locomotion level  PARTICIPATION LIMITATIONS: cleaning, community activity, and yard work  PERSONAL FACTORS: Age, Sex, Time since onset of injury/illness/exacerbation, and 3+ comorbidities: PMH significant for diverticulitis, basal cell carcinoma, hx of shingle, HTN, orthostatic hypotension, MI, osteoporosis are also affecting patient's functional outcome.   REHAB POTENTIAL: Good  CLINICAL  DECISION MAKING: Stable/uncomplicated  EVALUATION COMPLEXITY: Low  PLAN:  PT FREQUENCY: 1-2x/week  PT DURATION: 8 weeks  PLANNED INTERVENTIONS: 97164- PT Re-evaluation, 97750- Physical Performance Testing, 97110-Therapeutic exercises, 97530- Therapeutic activity, [id number]- Neuromuscular re-education, 97535- Self Care, [id number]- Manual therapy, [id number]- Gait training, [phone]- Orthotic Initial, [phone]- Orthotic/Prosthetic subsequent, [phone]- Canalith repositioning, Patient/Family education, Balance training, Stair training, Taping, Dry Needling, Joint mobilization, Spinal mobilization, Vestibular training, DME instructions, Wheelchair mobility training, Cryotherapy, and Moist heat  PLAN FOR NEXT SESSION:  - review ankle exercises on HEP  - follow-up to make sure not causing pain  - R LE hip mildly weaker than L (hx of R LE sciatica) - higher level balance activities on compliant surfaces - stairs (pt states greatest challenge with stairs is due to her glasses) - yoga-balance interventions    Brelyn Woehl, PT, DPT, NCS, CSRS Physical Therapist - [facility]  [facility]  11:17 AM 01/03/24

## 2024-01-07 ENCOUNTER — Ambulatory Visit

## 2024-01-07 DIAGNOSIS — R2689 Other abnormalities of gait and mobility: Secondary | ICD-10-CM

## 2024-01-07 DIAGNOSIS — R278 Other lack of coordination: Secondary | ICD-10-CM

## 2024-01-07 DIAGNOSIS — R293 Abnormal posture: Secondary | ICD-10-CM

## 2024-01-07 DIAGNOSIS — M6281 Muscle weakness (generalized): Secondary | ICD-10-CM

## 2024-01-07 DIAGNOSIS — R2681 Unsteadiness on feet: Secondary | ICD-10-CM | POA: Diagnosis not present

## 2024-01-07 NOTE — Therapy (Signed)
 OUTPATIENT PHYSICAL THERAPY TREATMENT   Patient Name: Debra Moody MRN: [id number] DOB:July 31, 1941, 83 y.o., female Today's Date: 01/07/2024   PCP: Sari Cunning, MD REFERRING PROVIDER: Sari Cunning, MD  END OF SESSION:   PT End of Session - 01/07/24 1145     Visit Number 5    Number of Visits 17    Date for PT Re-Evaluation 02/06/24    PT Start Time 1145    PT Stop Time 1225    PT Time Calculation (min) 40 min    Equipment Utilized During Treatment Gait belt    Activity Tolerance Patient tolerated treatment well;No increased pain    Behavior During Therapy WFL for tasks assessed/performed               Past Medical History:  Diagnosis Date   Actinic keratosis 11/09/2008   L mid dorsum lat forearm - bx proven   Basal cell carcinoma 11/11/2007   R top of shoulder   Basal cell carcinoma 11/24/2013   L sup med scapula   Basal cell carcinoma 12/22/2013   Mid back spinal    Complication of anesthesia    Gas at dentist office "made me feel like I was going to die".   Diverticulitis    Dysplastic nevus    left forearm   Family history of adverse reaction to anesthesia    Mother - Rash   GERD (gastroesophageal reflux disease)    History of shingles 2015   Hyperlipidemia    Hypertension    MI (myocardial infarction) (HCC) 11/03/2014   Severe damage to the left ventricle    Orthostatic hypotension    Osteoporosis    bone density test done 2019   Skin cancer, basal cell    Past Surgical History:  Procedure Laterality Date   ABDOMINAL HYSTERECTOMY     bladder tack     CARDIAC CATHETERIZATION  11/03/2014   CATARACT EXTRACTION W/PHACO Left 09/20/2021   Procedure: CATARACT EXTRACTION PHACO AND INTRAOCULAR LENS PLACEMENT (IOC) LEFT 5.68 00:56.7;  Surgeon: Annell Kidney, MD;  Location: [facility];  Service: Ophthalmology;  Laterality: Left;   COLONOSCOPY WITH PROPOFOL  N/A 07/01/2017   Procedure: COLONOSCOPY WITH PROPOFOL ;  Surgeon: Cassie Click, MD;  Location: [facility] END[facility]COPY;  Service: Endoscopy;  Laterality: N/A;   GUM SURGERY     Patient Active Problem List   Diagnosis Date Noted   Shortness of breath 10/22/2017   Coronary artery disease involving native coronary artery of native heart without angina pectoris 09/05/2016   Orthostatic hypotension 03/07/2015   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   Anxiety 12/01/2014   Takotsubo syndrome 12/01/2014   Hyperlipidemia 12/01/2014    ONSET DATE: a couple years ago  REFERRING DIAG:  R42 (ICD-10-CM) - Dysequilibrium    THERAPY DIAG:  Unsteadiness on feet  Muscle weakness (generalized)  Other abnormalities of gait and mobility  Abnormal posture  Other lack of coordination  Rationale for Evaluation and Treatment: Rehabilitation  SUBJECTIVE:  SUBJECTIVE STATEMENT: Still wearing ankle sleeves bilat to helkp with soreness. No other updates. Sciatic pain bad thins morning, but she was able to manage that.   Pt accompanied by: self  PERTINENT HISTORY:  The pt is a pleasant 83 y/o female referred to PT for dysequilibrium (recently working with Pelvic PT). Pt reports 1 fall prior to referral and prior to being taken off of amlodipine. Prior to removal of this med by her physician she was experiencing dizziness & syncope. While pt has not fallen since she reports balance is not as good as it should be (she reports Pelvic PT did help this) and she has had two dizzy spells. During these spells she reports difficulty with mobility for a minute and some low-BP like symptoms affecting her vision. She also notes some weakness. She currently helps take care of her husband who is ill and reports stress also impacts her.  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 Pt thinks her balance on even surfaces is OK, but might have some difficulty with uneven surfaces (such as walking on pebbles) and she notes difficulty with stairs.  PMH significant for diverticulitis, basal cell carcinoma, hx of shingle, HTN, orthostatic hypotension, MI, osteoporosis  PAIN:  Are you having pain? No pain on arrival   PRECAUTIONS: Fall  RED FLAGS: None   WEIGHT BEARING RESTRICTIONS: No  FALLS: Has patient fallen in last 6 months? Yes. Number of falls 3  LIVING ENVIRONMENT: Lives with: lives with their spouse Stairs: 4 steps to get into her home, 1-2 rails depending on entrance but far apar  PLOF: Independent  PATIENT GOALS: Would like to improve core strength, doesn't want to fall anymore, get back to yoga   OBJECTIVE:  Note: Objective measures were completed at Evaluation unless otherwise noted.  DIAGNOSTIC FINDINGS:  No recent pertinent imaging available in chart  COGNITION: Overall cognitive status: Within functional limits for tasks assessed   SENSATION: Tingling in L thumb due to procedure, numbness/sensation change R thigh to knee due to sciatic issues. Sometimes she has some issues with her feet, improved with new shoe inserts   LOWER EXTREMITY MMT:    MMT Right Eval Left Eval  Hip flexion 4 4  Hip extension    Hip abduction 4- 4  Hip adduction 4+ 4+  Hip internal rotation    Hip external rotation    Knee flexion 5 5  Knee extension 5 5  Ankle dorsiflexion 5 5  Ankle plantarflexion 5 5  Ankle inversion    Ankle eversion    (Blank rows = not tested)  TRANSFERS: indep  RAMP:  Impaired per ABC questionnaire  STAIRS: Impaired per ABC questionnaire and hx  GAIT:  Increased variability in BOS with horizontal scanning/head turns. Only tested on firm surface, should assess future on uneven surfacaes   FUNCTIONAL TESTS:  10 meter walk test: 1.6 m/s Dynamic Gait Index: 23/24 MCTSIB: completes all 4 conditions  successfully but does exhibit increased sway in conditions 2, 3 and 4 (most unsteady in 4) BERG:  54/56  PATIENT SURVEYS:  ABC scale 59.1  TREATMENT DATE: 01/07/24  -Single leg balance firm surface 6x 25sec bilat, alternating  -partial lunge with trunk twist 15x bilat  -balance beam walking on 2x4 (hands PRN only) (forward) -balance beam lateral stepping on 2x4  -Airex stance + forward toe taps 10x, ten 10x lateral taps 12" step -latral side stepping c 3lb AW bilat, 3" step, 4" step, airex pad -cable resisted AMB 7.5 2x each direction (4), most difficulty with balance with eccentric Left side stepping -Multitasky walky talky with grewen bally in ahllway and red mat x10 minutes    PATIENT EDUCATION: Education details: exercise technique, HEP Person educated: Patient Education method: Explanation, demo, VC, TC, printout Education comprehension: verbalized understanding, returned demo, requires further instruction  HOME EXERCISE PROGRAM: Access Code: [email or web address] URL: [email or web address] Date: 01/03/2024 Prepared by: Carlen Chasten  Exercises - Supine Bridge with Resistance Band  - 1 x daily - 2-3 x weekly - 2 sets - 10 reps - 2-3 seconds hold - Clamshell with Resistance  - 1 x daily - 2-3 x weekly - 1 sets - 20 reps - Standing Single Leg Stance with Counter Support  - 1 x daily - 7 x weekly - 2 sets - 30 seconds hold - Forward Lunge with Back Leg Straight and Counter Support  - 1 x daily - 5 x weekly - 2 sets - 10 reps - 30 seconds hold - Side Stepping with Resistance at Thighs and Counter Support  - 1 x daily - 3-5 x weekly - 3 sets - 10 reps - Seated Ankle Plantarflexion with Resistance  - 1 x daily - 3-5 x weekly - 2 sets - 12 reps - Seated Ankle Dorsiflexion with Anchored Resistance  - 1 x daily - 3-5 x weekly - 2 sets - 12 reps - Seated Ankle  Eversion with Anchored Resistance  - 1 x daily - 3-5 x weekly - 2 sets - 12 reps  GOALS: Goals reviewed with patient? Yes  SHORT TERM GOALS: Target date: 01/09/2024  Patient will be independent in home exercise program to improve balance, strength/mobility for increased safety with ADLs. Baseline: to be initiated next 1-2 visits  Goal status: INITIAL  LONG TERM GOALS: Target date: 02/06/2024  Patient will increase ABC scale score >80% to demonstrate better functional mobility and better confidence with ADLs.  Baseline: 59.1 Goal status: INITIAL  2.  Patient will demonstrate at least 10 seconds or more of SLB on each LE to indicate improved balance for obstacle negotiation and gait. Baseline: currently able to sustain max of 5 sec Goal status: INITIAL  3.  The pt will demonstrate at least 4+/5 bilateral hip abduction and hip flexion strength to indicate increased strength for improved mobility Baseline: hip flexion 4/5 bilat, hip abduction 4-/5 on R and 4/5 L Goal status: INITIAL  4.  The patient will report a return to yoga either at home or by attending a class without LOB or concern for LOB to return to PLOF. Baseline: not currently practicing yoga Goal status: INITIAL  ASSESSMENT:  CLINICAL IMPRESSION:  Heavy emphasis on gait-based ankle stability program and balance program for ankle strength. Pt gives feedback on fatigue and recovery needs. The pt will benefit from further skilled PT to improve impairments in order to increase strength, balance, and decrease fall risk.   OBJECTIVE IMPAIRMENTS: decreased balance, decreased strength, dizziness, impaired sensation, improper body mechanics, and pain.   ACTIVITY LIMITATIONS: carrying, bending, stairs, and locomotion level  PARTICIPATION LIMITATIONS: cleaning, community activity, and yard work  PERSONAL FACTORS: Age, Sex, Time since onset of injury/illness/exacerbation, and 3+ comorbidities: PMH significant for diverticulitis,  basal cell carcinoma, hx of shingle, HTN, orthostatic hypotension, MI, osteoporosis are also affecting patient's functional outcome.   REHAB POTENTIAL: Good  CLINICAL DECISION MAKING: Stable/uncomplicated  EVALUATION COMPLEXITY: Low  PLAN:  PT FREQUENCY: 1-2x/week  PT DURATION: 8 weeks  PLANNED INTERVENTIONS: 97164- PT Re-evaluation, 97750- Physical Performance Testing, 97110-Therapeutic exercises, 97530- Therapeutic activity, 97112- Neuromuscular re-education, 97535- Self Care, 65784- Manual therapy, [phone]- Gait training, [phone]- Orthotic Initial, [phone]- Orthotic/Prosthetic subsequent, [phone]- Canalith repositioning, Patient/Family education, Balance training, Stair training, Taping, Dry Needling, Joint mobilization, Spinal mobilization, Vestibular training, DME instructions, Wheelchair mobility training, Cryotherapy, and Moist heat  PLAN FOR NEXT SESSION:  - review ankle exercises on HEP - follow-up to make sure not causing pain  - R LE hip mildly weaker than L (hx of R LE sciatica) - higher level balance activities on compliant surfaces - stairs (pt states greatest challenge with stairs is due to her glasses) - yoga-balance interventions   11:47 AM, 01/07/24 Dawn Eth, PT, DPT Physical Therapist - [facility]  Outpatient Physical Therapy- Main Campus [phone]

## 2024-01-10 ENCOUNTER — Ambulatory Visit: Admitting: Physical Therapy

## 2024-01-10 DIAGNOSIS — R2689 Other abnormalities of gait and mobility: Secondary | ICD-10-CM

## 2024-01-10 DIAGNOSIS — R278 Other lack of coordination: Secondary | ICD-10-CM

## 2024-01-10 DIAGNOSIS — R293 Abnormal posture: Secondary | ICD-10-CM

## 2024-01-10 DIAGNOSIS — R2681 Unsteadiness on feet: Secondary | ICD-10-CM

## 2024-01-10 DIAGNOSIS — M6281 Muscle weakness (generalized): Secondary | ICD-10-CM

## 2024-01-10 NOTE — Therapy (Signed)
 OUTPATIENT PHYSICAL THERAPY TREATMENT   Patient Name: Debra Moody MRN: [id number] DOB:18-Oct-1940, 83 y.o., female Today's Date: 01/10/2024   PCP: Sari Cunning, MD REFERRING PROVIDER: Sari Cunning, MD  END OF SESSION:   PT End of Session - 01/10/24 0936     Visit Number 6    Number of Visits 17    Date for PT Re-Evaluation 02/06/24    PT Start Time 0936    PT Stop Time 1016    PT Time Calculation (min) 40 min    Equipment Utilized During Treatment Gait belt    Activity Tolerance Patient tolerated treatment well;No increased pain    Behavior During Therapy WFL for tasks assessed/performed                Past Medical History:  Diagnosis Date   Actinic keratosis 11/09/2008   L mid dorsum lat forearm - bx proven   Basal cell carcinoma 11/11/2007   R top of shoulder   Basal cell carcinoma 11/24/2013   L sup med scapula   Basal cell carcinoma 12/22/2013   Mid back spinal    Complication of anesthesia    Gas at dentist office "made me feel like I was going to die".   Diverticulitis    Dysplastic nevus    left forearm   Family history of adverse reaction to anesthesia    Mother - Rash   GERD (gastroesophageal reflux disease)    History of shingles 2015   Hyperlipidemia    Hypertension    MI (myocardial infarction) (HCC) 11/03/2014   Severe damage to the left ventricle    Orthostatic hypotension    Osteoporosis    bone density test done 2019   Skin cancer, basal cell    Past Surgical History:  Procedure Laterality Date   ABDOMINAL HYSTERECTOMY     bladder tack     CARDIAC CATHETERIZATION  11/03/2014   CATARACT EXTRACTION W/PHACO Left 09/20/2021   Procedure: CATARACT EXTRACTION PHACO AND INTRAOCULAR LENS PLACEMENT (IOC) LEFT 5.68 00:56.7;  Surgeon: Annell Kidney, MD;  Location: [facility];  Service: Ophthalmology;  Laterality: Left;   COLONOSCOPY WITH PROPOFOL  N/A 07/01/2017   Procedure: COLONOSCOPY WITH PROPOFOL ;  Surgeon: Cassie Click, MD;  Location: [facility];  Service: Endoscopy;  Laterality: N/A;   GUM SURGERY     Patient Active Problem List   Diagnosis Date Noted   Shortness of breath 10/22/2017   Coronary artery disease involving native coronary artery of native 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 Pt continues to report she has to wear her ankle sleeves because she has "slight pain" with the exercises. However, pt unable to recall which exercise it is that causes the discomfort. Pt states the ankle eversion exercise is really tough.  Pt accompanied by: self  PERTINENT HISTORY:  The pt is a pleasant 83 y/o female referred to PT for dysequilibrium (recently working with Pelvic PT). Pt reports 1 fall prior to referral and prior to being taken off of amlodipine. Prior to removal of this med by her physician she was experiencing dizziness & syncope. While pt has not fallen since she reports balance is not as good as it should be (she reports Pelvic PT did help this) and she has had two dizzy spells. During these spells she reports difficulty with mobility for a minute and some low-BP like symptoms affecting her vision. She also notes  some weakness. She currently helps take care of her husband who is ill and reports stress also impacts her.  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 Pt thinks her balance on even surfaces is OK, but might have some difficulty with uneven surfaces (such as walking on pebbles) and she notes difficulty with stairs.  PMH significant for diverticulitis, basal cell carcinoma, hx of shingle, HTN, orthostatic hypotension, MI, osteoporosis  PAIN:  Are you having pain? No pain on arrival   PRECAUTIONS: Fall  RED FLAGS: None   WEIGHT BEARING RESTRICTIONS: No  FALLS: Has patient fallen in last 6 months? Yes. Number of falls 3  LIVING ENVIRONMENT: Lives with: lives with their spouse Stairs: 4 steps to get into her home, 1-2 rails depending on entrance but far apar  PLOF: Independent  PATIENT GOALS: Would like to improve core strength, doesn't want to fall anymore, get back to yoga   OBJECTIVE:  Note: Objective measures were completed at Evaluation unless otherwise noted.  DIAGNOSTIC FINDINGS:  No recent pertinent imaging available in chart  COGNITION: Overall cognitive status: Within functional limits for tasks assessed   SENSATION: Tingling in L thumb due to procedure, numbness/sensation change R thigh to knee due to sciatic issues. Sometimes she has some issues with her feet, improved with new shoe inserts   LOWER EXTREMITY MMT:    MMT Right Eval Left Eval  Hip flexion 4 4  Hip extension    Hip abduction 4- 4  Hip adduction 4+ 4+  Hip internal rotation    Hip external rotation    Knee flexion 5 5  Knee extension 5 5  Ankle dorsiflexion 5 5  Ankle plantarflexion 5 5  Ankle inversion    Ankle eversion    (Blank rows = not tested)  TRANSFERS: indep  RAMP:  Impaired per ABC questionnaire  STAIRS: Impaired per ABC questionnaire and hx  GAIT:  Increased variability in BOS with horizontal scanning/head turns. Only  tested on firm surface, should assess future on uneven surfacaes   FUNCTIONAL TESTS:  10 meter walk test: 1.6 m/s Dynamic Gait Index: 23/24 MCTSIB: completes all 4 conditions successfully but does exhibit increased sway in conditions 2, 3 and 4 (most unsteady in 4) BERG:  54/56  PATIENT SURVEYS:  ABC scale 59.1  TREATMENT DATE: 01/10/24  Therapy session focused on dynamic balance on compliant surfaces to engage ankle muscles and balance strategies as well as functional B LE strengthening during a dynamic task  Partial lunge with trunk twist 5x bilat   Standing NBOS on airex x10 horizontal head turns x10 vertical head turns  Standing on airex foot taps to 2 targets placed on brown step in front of pt and then 1 target on either side of her (cones)  Forward/backwards and lateral stepping on/off airex in variable directions as called out by therapist  A few instances of light min A initially but improved to CGA Lateral side stepping along airex balance beam while performing foot taps to hedgehogs placed anteriorly and posteriorly CGA with occasional light min A  Forward/backwards stepping along airex balance beam while performing foot taps to hedge hogs placed on either side, laterally light min A with pt requiring a few instances of LUE support on balance bar walking posteriorly was the most challenging  Cable machine resisted walking:  Forward walking against 7.5lb resistance x2 reps  Lateral side stepping against 7.5lb resistance x2 reps per side  Pt reports really feeling this in her lateral hip muscles    PATIENT EDUCATION: Education details: exercise technique, HEP Person educated: Patient Education method: Explanation, demo, VC, TC, printout Education comprehension: verbalized understanding, returned demo, requires further instruction  HOME EXERCISE  PROGRAM: Access Code: [email or web address] URL: [email or web address] Date: 01/03/2024 Prepared by: Carlen Chasten  Exercises - Supine Bridge with Resistance Band  - 1 x daily - 2-3 x weekly - 2 sets - 10 reps - 2-3 seconds hold - Clamshell with Resistance  - 1 x daily - 2-3 x weekly - 1 sets - 20 reps - Standing Single Leg Stance with Counter Support  - 1 x daily - 7 x weekly - 2 sets - 30 seconds hold - Forward Lunge with Back Leg Straight and Counter Support  - 1 x daily - 5 x weekly - 2 sets - 10 reps - 30 seconds hold - Side Stepping with Resistance at Thighs and Counter Support  - 1 x daily - 3-5 x weekly - 3 sets - 10 reps - Seated Ankle Plantarflexion with Resistance  - 1 x daily - 3-5 x weekly - 2 sets - 12 reps - Seated Ankle Dorsiflexion with Anchored Resistance  - 1 x daily - 3-5 x weekly - 2 sets - 12 reps - Seated Ankle Eversion with Anchored Resistance  - 1 x daily - 3-5 x weekly - 2 sets - 12 reps  GOALS: Goals reviewed with patient? Yes  SHORT TERM GOALS: Target date: 01/09/2024  Patient will be independent in home exercise program to improve balance, strength/mobility for increased safety with ADLs. Baseline: to be initiated next 1-2 visits  Goal status: INITIAL  LONG TERM GOALS: Target date: 02/06/2024  Patient will increase ABC scale score >80% to demonstrate better functional mobility and better confidence with ADLs.  Baseline: 59.1 Goal status: INITIAL  2.  Patient will demonstrate at least 10 seconds or more of SLB on each LE to indicate improved balance for obstacle negotiation and gait. Baseline: currently able to sustain max of 5 sec Goal status: INITIAL  3.  The pt will demonstrate at least 4+/5 bilateral hip abduction and hip flexion strength to indicate increased strength for improved mobility Baseline: hip flexion 4/5 bilat, hip abduction 4-/5 on R and 4/5 L Goal status: INITIAL  4.  The patient will report a  return to yoga either at home or by  attending a class without LOB or concern for LOB to return to PLOF. Baseline: not currently practicing yoga Goal status: INITIAL  ASSESSMENT:  CLINICAL IMPRESSION:  Therapy session focused on higher level dynamic balance tasks on compliant surfaces with pt demonstrating greatest imbalance with tapping items while maintaining single leg stance (specifically tapping backwards) and backwards walking along airex pad. Pt also continues to report feeling lateral hip muscle weakness with noticeable muscle fatigue during side stepping against cable machine resistance. Patient reports at this time she will likely only be able to attend 2 more physical therapy appointments because after that she will need to spend time supporting her husband. Ms. Dillavou will benefit from further skilled PT to improve impairments in order to increase strength, balance, and decrease fall risk.   OBJECTIVE IMPAIRMENTS: decreased balance, decreased strength, dizziness, impaired sensation, improper body mechanics, and pain.   ACTIVITY LIMITATIONS: carrying, bending, stairs, and locomotion level  PARTICIPATION LIMITATIONS: cleaning, community activity, and yard work  PERSONAL FACTORS: Age, Sex, Time since onset of injury/illness/exacerbation, and 3+ comorbidities: PMH significant for diverticulitis, basal cell carcinoma, hx of shingle, HTN, orthostatic hypotension, MI, osteoporosis are also affecting patient's functional outcome.   REHAB POTENTIAL: Good  CLINICAL DECISION MAKING: Stable/uncomplicated  EVALUATION COMPLEXITY: Low  PLAN:  PT FREQUENCY: 1-2x/week  PT DURATION: 8 weeks  PLANNED INTERVENTIONS: 97164- PT Re-evaluation, 97750- Physical Performance Testing, 97110-Therapeutic exercises, 97530- Therapeutic activity, [id number]- Neuromuscular re-education, 97535- Self Care, [id number]- Manual therapy, [id number]- Gait training, [phone]- Orthotic Initial, [phone]- Orthotic/Prosthetic subsequent, [phone]- Canalith repositioning,  Patient/Family education, Balance training, Stair training, Taping, Dry Needling, Joint mobilization, Spinal mobilization, Vestibular training, DME instructions, Wheelchair mobility training, Cryotherapy, and Moist heat  PLAN FOR NEXT SESSION:  - R LE hip mildly weaker than L (hx of R LE sciatica) - higher level balance activities on compliant surfaces  - variable stepping over obstacles - stairs (pt states greatest challenge with stairs is due to her glasses) - yoga-balance interventions  *pt with only 2 visits left*   Keonta Monceaux, PT, DPT, NCS, CSRS Physical Therapist - [facility]  [facility]  10:41 AM 01/10/24

## 2024-01-14 ENCOUNTER — Ambulatory Visit: Attending: Internal Medicine

## 2024-01-14 DIAGNOSIS — R2681 Unsteadiness on feet: Secondary | ICD-10-CM | POA: Diagnosis present

## 2024-01-14 DIAGNOSIS — M6281 Muscle weakness (generalized): Secondary | ICD-10-CM | POA: Insufficient documentation

## 2024-01-14 NOTE — Therapy (Unsigned)
 OUTPATIENT PHYSICAL THERAPY TREATMENT   Patient Name: Debra Moody MRN: [id number] DOB:1941-05-25, 83 y.o., female Today's Date: 01/15/2024   PCP: Sari Cunning, MD REFERRING PROVIDER: Sari Cunning, MD  END OF SESSION:   PT End of Session - 01/15/24 1032     Visit Number 7    Number of Visits 17    Date for PT Re-Evaluation 02/06/24    PT Start Time 1150    PT Stop Time 1229    PT Time Calculation (min) 39 min    Equipment Utilized During Treatment Gait belt    Activity Tolerance Patient tolerated treatment well    Behavior During Therapy WFL for tasks assessed/performed                 Past Medical History:  Diagnosis Date   Actinic keratosis 11/09/2008   L mid dorsum lat forearm - bx proven   Basal cell carcinoma 11/11/2007   R top of shoulder   Basal cell carcinoma 11/24/2013   L sup med scapula   Basal cell carcinoma 12/22/2013   Mid back spinal    Complication of anesthesia    Gas at dentist office "made me feel like I was going to die".   Diverticulitis    Dysplastic nevus    left forearm   Family history of adverse reaction to anesthesia    Mother - Rash   GERD (gastroesophageal reflux disease)    History of shingles 2015   Hyperlipidemia    Hypertension    MI (myocardial infarction) (HCC) 11/03/2014   Severe damage to the left ventricle    Orthostatic hypotension    Osteoporosis    bone density test done 2019   Skin cancer, basal cell    Past Surgical History:  Procedure Laterality Date   ABDOMINAL HYSTERECTOMY     bladder tack     CARDIAC CATHETERIZATION  11/03/2014   CATARACT EXTRACTION W/PHACO Left 09/20/2021   Procedure: CATARACT EXTRACTION PHACO AND INTRAOCULAR LENS PLACEMENT (IOC) LEFT 5.68 00:56.7;  Surgeon: Annell Kidney, MD;  Location: [facility];  Service: Ophthalmology;  Laterality: Left;   COLONOSCOPY WITH PROPOFOL  N/A 07/01/2017   Procedure: COLONOSCOPY WITH PROPOFOL ;  Surgeon: Cassie Click, MD;   Location: [facility] END[facility]COPY;  Service: Endoscopy;  Laterality: N/A;   GUM SURGERY     Patient Active Problem List   Diagnosis Date Noted   Shortness of breath 10/22/2017   Coronary artery disease involving native coronary artery of native heart without angina pectoris 09/05/2016   Orthostatic hypotension 03/07/2015   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   Anxiety 12/01/2014   Takotsubo syndrome 12/01/2014   Hyperlipidemia 12/01/2014    ONSET DATE: a couple years ago  REFERRING DIAG:  R42 (ICD-10-CM) - Dysequilibrium    THERAPY DIAG:  Unsteadiness on feet  Muscle weakness (generalized)  Rationale for Evaluation and Treatment: Rehabilitation  SUBJECTIVE:  SUBJECTIVE STATEMENT: Pt feeling a little off today. She is aware today is second to last PT visit.  Pt accompanied by: self  PERTINENT HISTORY:  The pt is a pleasant 83 y/o female referred to PT for dysequilibrium (recently working with Pelvic PT). Pt reports 1 fall prior to referral and prior to being taken off of amlodipine. Prior to removal of this med by her physician she was experiencing dizziness & syncope. While pt has not fallen since she reports balance is not as good as it should be (she reports Pelvic PT did help this) and she has had two dizzy spells. During these spells she reports difficulty with mobility for a minute and some low-BP like symptoms affecting her vision. She also notes some weakness. She currently helps take care of her husband who is ill and reports stress also impacts her.  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 Pt thinks her balance on even surfaces is OK, but might have some difficulty with uneven surfaces (such as walking on pebbles) and she notes  difficulty with stairs.  PMH significant for diverticulitis, basal cell carcinoma, hx of shingle, HTN, orthostatic hypotension, MI, osteoporosis  PAIN:  Are you having pain? No pain on arrival   PRECAUTIONS: Fall  RED FLAGS: None   WEIGHT BEARING RESTRICTIONS: No  FALLS: Has patient fallen in last 6 months? Yes. Number of falls 3  LIVING ENVIRONMENT: Lives with: lives with their spouse Stairs: 4 steps to get into her home, 1-2 rails depending on entrance but far apar  PLOF: Independent  PATIENT GOALS: Would like to improve core strength, doesn't want to fall anymore, get back to yoga   OBJECTIVE:  Note: Objective measures were completed at Evaluation unless otherwise noted.  DIAGNOSTIC FINDINGS:  No recent pertinent imaging available in chart  COGNITION: Overall cognitive status: Within functional limits for tasks assessed   SENSATION: Tingling in L thumb due to procedure, numbness/sensation change R thigh to knee due to sciatic issues. Sometimes she has some issues with her feet, improved with new shoe inserts   LOWER EXTREMITY MMT:    MMT Right Eval Left Eval  Hip flexion 4 4  Hip extension    Hip abduction 4- 4  Hip adduction 4+ 4+  Hip internal rotation    Hip external rotation    Knee flexion 5 5  Knee extension 5 5  Ankle dorsiflexion 5 5  Ankle plantarflexion 5 5  Ankle inversion    Ankle eversion    (Blank rows = not tested)  TRANSFERS: indep  RAMP:  Impaired per ABC questionnaire  STAIRS: Impaired per ABC questionnaire and hx  GAIT:  Increased variability in BOS with horizontal scanning/head turns. Only tested on firm surface, should assess future on uneven surfacaes   FUNCTIONAL TESTS:  10 meter walk test: 1.6 m/s Dynamic Gait Index: 23/24 MCTSIB: completes all 4 conditions successfully but does exhibit increased sway in conditions 2, 3 and 4 (most unsteady in 4) BERG:  54/56  PATIENT SURVEYS:  ABC scale 59.1  TREATMENT DATE: 01/15/24  GOAL REASSESSMENT COMPLETED FOR SECOND TO LAST VISIT   TA: ABC questionnaire: 83.13% MMT: bilat hip flexion grossly 4/5, bilat hip abduction grossly 4+/5 SLB testing: achieves well over 10 seconds SLB on each LE Please see goal section below for further details of assessment/testing  Update to HEP/review of interventions to complete at home Standing marches 3x15 each LE with BUE support  Monster walks (LTL) x 30 sec RTB monster walks trial with red t.band around ankle and then around knee x 30 sec for each  Reviewed downward dog modifications/technique and home yoga video patient can follow (see link below, printout provided to pt). Pt able to demo pain-free downward dog position (modified) in session Try this video with modifications if needed and report back to PT next visit: [email or web address]  Additional review of printout  Access Code: [email or web address] URL: [email or web address] Date: 01/14/2024 Prepared by: Aminta Kales  Exercises - Standing March with Counter Support  - 1 x daily - 4-5 x weekly - 3 sets - 15 reps - Lateral Monster Walk with Squat and Resistance (BKA)  - 1 x daily - 4-5 x weekly - 4 sets - 1 reps - 30 seconds  hold  NMR: Quick gait/retro-gait 6x10 ft no LOB  Standing lunge with overhead raise 6x each side, provided modification for improved R ankle comfort    PATIENT EDUCATION: Education details: exercise technique, HEP Person educated: Patient Education method: Explanation, demo, VC, TC, printout Education comprehension: verbalized understanding, returned demo, requires further instructio  HOME EXERCISE PROGRAM: Access Code: [email or web address] URL: [email or web address] Date: 01/03/2024 Prepared by: Carlen Chasten  Exercises - Supine Bridge with Resistance Band  - 1 x daily - 2-3 x weekly  - 2 sets - 10 reps - 2-3 seconds hold - Clamshell with Resistance  - 1 x daily - 2-3 x weekly - 1 sets - 20 reps - Standing Single Leg Stance with Counter Support  - 1 x daily - 7 x weekly - 2 sets - 30 seconds hold - Forward Lunge with Back Leg Straight and Counter Support  - 1 x daily - 5 x weekly - 2 sets - 10 reps - 30 seconds hold - Side Stepping with Resistance at Thighs and Counter Support  - 1 x daily - 3-5 x weekly - 3 sets - 10 reps - Seated Ankle Plantarflexion with Resistance  - 1 x daily - 3-5 x weekly - 2 sets - 12 reps - Seated Ankle Dorsiflexion with Anchored Resistance  - 1 x daily - 3-5 x weekly - 2 sets - 12 reps - Seated Ankle Eversion with Anchored Resistance  - 1 x daily - 3-5 x weekly - 2 sets - 12 reps  GOALS: Goals reviewed with patient? Yes  SHORT TERM GOALS: Target date: 01/09/2024  Patient will be independent in home exercise program to improve balance, strength/mobility for increased safety with ADLs. Baseline: to be initiated next 1-2 visits; 6/3: updated to include home yoga video Goal status: IN PROGRESS  LONG TERM GOALS: Target date: 02/06/2024  Patient will increase ABC scale score >80% to demonstrate better functional mobility and better confidence with ADLs.  Baseline: 59.1; 6/3: 83.13% Goal status: MET  2.  Patient will demonstrate at least 10 seconds or more of SLB on each LE to indicate improved balance for obstacle negotiation and gait. Baseline: currently able to sustain max of 5 sec; 6/3: >10 sec BLE with sleeve on  Goal status: MET  3.  The pt will  demonstrate at least 4+/5 bilateral hip abduction and hip flexion strength to indicate increased strength for improved mobility Baseline: hip flexion 4/5 bilat, hip abduction 4-/5 on R and 4/5 l; 6/3: flexion 4/5 bilat, 4+/5 abduction bilat  Goal status: PARTIALLY MET  4.  The patient will report a return to yoga either at home or by attending a class without LOB or concern for LOB to return to  PLOF. Baseline: not currently practicing yoga; 01/14/24: Pt has not been able to go due to not being able to (spuse currently having difficulty), pt plan to attempt home yoga video after today's appt Goal status: IN PROGRESS  ASSESSMENT:  CLINICAL IMPRESSION:  Reassessment of goals completed for pt's second to last PT visit.  Pt has now met ABC and SLB goals and partially met MMT goal. HEP updated to address remaining deficits and attempt pt's return to home yoga practice. Pt to follow-up with PT next visit regarding how home yoga video went. Ms. Bhagat will benefit from further skilled PT to improve impairments in order to increase strength, balance, and decrease fall risk.   OBJECTIVE IMPAIRMENTS: decreased balance, decreased strength, dizziness, impaired sensation, improper body mechanics, and pain.   ACTIVITY LIMITATIONS: carrying, bending, stairs, and locomotion level  PARTICIPATION LIMITATIONS: cleaning, community activity, and yard work  PERSONAL FACTORS: Age, Sex, Time since onset of injury/illness/exacerbation, and 3+ comorbidities: PMH significant for diverticulitis, basal cell carcinoma, hx of shingle, HTN, orthostatic hypotension, MI, osteoporosis are also affecting patient's functional outcome.   REHAB POTENTIAL: Good  CLINICAL DECISION MAKING: Stable/uncomplicated  EVALUATION COMPLEXITY: Low  PLAN:  PT FREQUENCY: 1-2x/week  PT DURATION: 8 weeks  PLANNED INTERVENTIONS: 97164- PT Re-evaluation, 97750- Physical Performance Testing, 97110-Therapeutic exercises, 97530- Therapeutic activity, 97112- Neuromuscular re-education, 97535- Self Care, [id number]- Manual therapy, [phone]- Gait training, [phone]- Orthotic Initial, [phone]- Orthotic/Prosthetic subsequent, [phone]- Canalith repositioning, Patient/Family education, Balance training, Stair training, Taping, Dry Needling, Joint mobilization, Spinal mobilization, Vestibular training, DME instructions, Wheelchair mobility training, Cryotherapy,  and Moist heat  PLAN FOR NEXT SESSION:  Review home yoga exercises for last PT visit, HEP   Aminta Kales PT, DPT  Physical Therapist - [facility]  [facility]  10:51 AM 01/15/24

## 2024-01-16 ENCOUNTER — Ambulatory Visit

## 2024-01-16 DIAGNOSIS — R2681 Unsteadiness on feet: Secondary | ICD-10-CM | POA: Diagnosis not present

## 2024-01-16 DIAGNOSIS — M6281 Muscle weakness (generalized): Secondary | ICD-10-CM

## 2024-01-16 NOTE — Therapy (Signed)
 OUTPATIENT PHYSICAL THERAPY TREATMENT   Patient Name: Debra Moody MRN: [id number] DOB:Mar 26, 1941, 83 y.o., female Today's Date: 01/16/2024   PCP: Sari Cunning, MD REFERRING PROVIDER: Sari Cunning, MD  END OF SESSION:   PT End of Session - 01/16/24 1221     Visit Number 8    Number of Visits 17    Date for PT Re-Evaluation 02/06/24    PT Start Time 1150    PT Stop Time 1215    PT Time Calculation (min) 25 min    Equipment Utilized During Treatment Gait belt    Activity Tolerance Patient tolerated treatment well    Behavior During Therapy WFL for tasks assessed/performed                  Past Medical History:  Diagnosis Date   Actinic keratosis 11/09/2008   L mid dorsum lat forearm - bx proven   Basal cell carcinoma 11/11/2007   R top of shoulder   Basal cell carcinoma 11/24/2013   L sup med scapula   Basal cell carcinoma 12/22/2013   Mid back spinal    Complication of anesthesia    Gas at dentist office "made me feel like I was going to die".   Diverticulitis    Dysplastic nevus    left forearm   Family history of adverse reaction to anesthesia    Mother - Rash   GERD (gastroesophageal reflux disease)    History of shingles 2015   Hyperlipidemia    Hypertension    MI (myocardial infarction) (HCC) 11/03/2014   Severe damage to the left ventricle    Orthostatic hypotension    Osteoporosis    bone density test done 2019   Skin cancer, basal cell    Past Surgical History:  Procedure Laterality Date   ABDOMINAL HYSTERECTOMY     bladder tack     CARDIAC CATHETERIZATION  11/03/2014   CATARACT EXTRACTION W/PHACO Left 09/20/2021   Procedure: CATARACT EXTRACTION PHACO AND INTRAOCULAR LENS PLACEMENT (IOC) LEFT 5.68 00:56.7;  Surgeon: Annell Kidney, MD;  Location: [facility];  Service: Ophthalmology;  Laterality: Left;   COLONOSCOPY WITH PROPOFOL  N/A 07/01/2017   Procedure: COLONOSCOPY WITH PROPOFOL ;  Surgeon: Cassie Click, MD;   Location: [facility];  Service: Endoscopy;  Laterality: N/A;   GUM SURGERY     Patient Active Problem List   Diagnosis Date Noted   Shortness of breath 10/22/2017   Coronary artery disease 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 She also did her HEP this morning. Ankle is feeling fine at the moment since she has the sleeve on.  Pt accompanied by: self  PERTINENT HISTORY:  The pt is a pleasant 83 y/o female referred to PT for dysequilibrium (recently working with Pelvic PT). Pt reports 1 fall prior to referral and prior to being taken off of amlodipine. Prior to removal of this med by her physician she was experiencing dizziness & syncope. While pt has not fallen since she reports balance is not as good as it should be (she reports Pelvic PT did help this) and she has had two dizzy spells. During these spells she reports difficulty with mobility for a minute and some low-BP like symptoms affecting her vision. She also notes some weakness. She currently helps take care of her husband who is ill and reports stress also impacts her.  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 Pt thinks her balance on even surfaces is OK, but might have some difficulty with uneven surfaces (such as walking on pebbles) and she notes difficulty with stairs.  PMH significant for diverticulitis, basal cell carcinoma, hx of shingle, HTN, orthostatic hypotension, MI, osteoporosis  PAIN:  Are you having pain? No pain on arrival   PRECAUTIONS: Fall  RED FLAGS: None   WEIGHT BEARING RESTRICTIONS: No  FALLS: Has patient fallen in last 6 months? Yes. Number of falls 3  LIVING ENVIRONMENT: Lives with: lives with their spouse Stairs: 4 steps to get into her home, 1-2 rails depending on entrance but far apar  PLOF: Independent  PATIENT GOALS: Would like to improve core strength, doesn't want to fall anymore, get back to yoga   OBJECTIVE:  Note: Objective measures were completed at Evaluation unless otherwise noted.  DIAGNOSTIC FINDINGS:  No recent pertinent imaging available in chart  COGNITION: Overall cognitive status: Within functional limits for tasks assessed   SENSATION: Tingling in L thumb due to procedure, numbness/sensation change R thigh to knee due to sciatic issues. Sometimes she has some issues with her feet, improved with new shoe inserts   LOWER EXTREMITY MMT:    MMT Right Eval Left Eval  Hip flexion 4 4  Hip extension    Hip abduction 4- 4  Hip adduction 4+ 4+  Hip internal rotation    Hip external rotation    Knee flexion 5 5  Knee extension 5 5  Ankle dorsiflexion 5 5  Ankle plantarflexion 5 5  Ankle inversion    Ankle eversion    (Blank rows = not tested)  TRANSFERS: indep  RAMP:  Impaired per ABC questionnaire  STAIRS: Impaired per ABC questionnaire and hx  GAIT:  Increased variability in BOS with horizontal scanning/head turns. Only tested on firm surface, should assess future on uneven surfacaes   FUNCTIONAL TESTS:  10 meter walk test: 1.6 m/s Dynamic Gait Index: 23/24 MCTSIB: completes all 4 conditions  successfully but does exhibit increased sway in conditions 2, 3 and 4 (most unsteady in 4) BERG:  54/56  PATIENT SURVEYS:  ABC scale 59.1  TREATMENT DATE: 01/16/24   Self-Care/Home management: PT agrees with pt and encourages pt to continue chair yoga videos since this did not bother ankle, discontinue other yoga video provided last visit. Reviewed d/c recommendations  TA: Green t.band Monster walks (LTL) 2x10 meters Green t.band FWD monster walks with green tband 2x20 ft  No ankle pain with interventions  Standing march 2x15 each LE  NMR:  FWD step on and retro-step off of airex 2x10 - no LOB LTL stepping on and off 2 airex pads (going to R and L sides) x multiple reps and no LOB  Semi-tandem x 2 rounds each LE - pt able to sustain 15+ sec initially and then 30 sec each LE    PATIENT EDUCATION: Education details: exercise technique, HEP, d/c recommendations Person educated: Patient Education method: Explanation, demo, VC, TC, Education comprehension: verbalized understanding, returned demo  HOME EXERCISE PROGRAM: Access Code: [email or web address] URL: [email or web address] Date: 01/03/2024 Prepared by: Carlen Chasten  Exercises - Supine Bridge with Resistance Band  - 1 x daily - 2-3 x weekly - 2 sets - 10 reps - 2-3 seconds hold - Clamshell with Resistance  - 1 x daily - 2-3 x weekly - 1 sets - 20 reps - Standing Single Leg Stance with Counter Support  - 1 x daily - 7 x weekly - 2 sets - 30 seconds hold - Forward Lunge with Back Leg Straight and Counter Support  - 1 x daily - 5 x weekly - 2 sets - 10 reps - 30 seconds hold - Side Stepping with Resistance at Thighs and Counter Support  - 1 x daily - 3-5 x weekly - 3 sets - 10 reps - Seated Ankle Plantarflexion with Resistance  - 1 x daily - 3-5 x weekly - 2 sets - 12 reps - Seated Ankle  Dorsiflexion with Anchored Resistance  - 1 x daily - 3-5 x weekly - 2 sets - 12 reps - Seated Ankle Eversion with Anchored Resistance  - 1 x daily - 3-5 x weekly - 2 sets - 12 reps  GOALS: Goals reviewed with patient? Yes  SHORT TERM GOALS: Target date: 01/09/2024  Patient will be independent in home exercise program to improve balance, strength/mobility for increased safety with ADLs. Baseline: to be initiated next 1-2 visits; 6/3: updated to include home yoga video Goal status: IN PROGRESS  LONG TERM GOALS: Target date: 02/06/2024  Patient will increase ABC scale score >80% to demonstrate better functional mobility and better confidence with ADLs.  Baseline: 59.1; 6/3: 83.13% Goal status: MET  2.  Patient will demonstrate at least 10 seconds or more of SLB on each LE to indicate improved balance for obstacle negotiation and gait. Baseline: currently able to sustain max of 5 sec; 6/3: >10 sec BLE with sleeve on  Goal status: MET  3.  The pt will demonstrate at least 4+/5 bilateral hip abduction and hip flexion strength to indicate increased strength for improved mobility Baseline: hip flexion 4/5 bilat, hip abduction 4-/5 on R and 4/5 l; 6/3: flexion 4/5 bilat, 4+/5 abduction bilat  Goal status: PARTIALLY MET  4.  The patient will report a return to yoga either at home or by attending a class without LOB or concern for LOB to return to PLOF. Baseline: not currently practicing yoga; 01/14/24: Pt has not been able to go due to not being able to (spuse currently having difficulty), pt plan to attempt home yoga video after today's appt Goal status: IN PROGRESS  ASSESSMENT:  CLINICAL IMPRESSION:  PT and pt discussed pt discontinuing yoga video provided last visit and instead continuing chair yoga video pt found as this does not bother her ankle. Pt exhibited excellent strength/endurance and balance with remaining interventions assessed. She is aware today is last PT visit and is a agreeable  to discharge from PT at this time. Session ended slightly early as pt had another appointment to get ot.  OBJECTIVE IMPAIRMENTS: decreased balance, decreased strength, dizziness, impaired sensation, improper body mechanics, and pain.   ACTIVITY LIMITATIONS: carrying, bending, stairs, and locomotion level  PARTICIPATION LIMITATIONS: cleaning, community activity, and yard work  PERSONAL FACTORS: Age, Sex, Time since onset of injury/illness/exacerbation, and 3+ comorbidities: PMH significant for diverticulitis, basal cell carcinoma, hx of shingle, HTN, orthostatic hypotension, MI, osteoporosis are also affecting patient's functional outcome.   REHAB POTENTIAL: Good  CLINICAL DECISION MAKING: Stable/uncomplicated  EVALUATION COMPLEXITY: Low  PLAN:  PT FREQUENCY: 1-2x/week  PT DURATION: 8 weeks  PLANNED INTERVENTIONS: 97164- PT Re-evaluation, 97750- Physical Performance Testing, 97110-Therapeutic exercises, 97530- Therapeutic activity, 97112- Neuromuscular re-education, 97535- Self Care, 16109- Manual therapy, [phone]- Gait training, [phone]- Orthotic Initial, [phone]- Orthotic/Prosthetic subsequent, [phone]- Canalith repositioning, Patient/Family education, Balance training, Stair training, Taping, Dry Needling, Joint mobilization, Spinal mobilization, Vestibular training, DME instructions, Wheelchair mobility training, Cryotherapy, and Moist heat  PLAN FOR NEXT SESSION:  D/c   Aminta Kales PT, DPT  Physical Therapist - [place]  [facility]  2:30 PM 01/16/24

## 2024-01-21 ENCOUNTER — Ambulatory Visit

## 2024-01-21 ENCOUNTER — Ambulatory Visit: Admitting: Physical Therapy

## 2024-01-23 ENCOUNTER — Ambulatory Visit

## 2024-01-27 ENCOUNTER — Ambulatory Visit: Admitting: Physical Therapy

## 2024-01-30 ENCOUNTER — Ambulatory Visit: Admitting: Physical Therapy

## 2024-02-03 ENCOUNTER — Ambulatory Visit: Admitting: Physical Therapy

## 2024-02-05 ENCOUNTER — Ambulatory Visit: Admitting: Physical Therapy

## 2024-02-10 ENCOUNTER — Ambulatory Visit: Admitting: Physical Therapy

## 2024-02-18 ENCOUNTER — Ambulatory Visit: Admitting: Physical Therapy

## 2024-02-24 ENCOUNTER — Ambulatory Visit: Admitting: Physical Therapy

## 2024-02-27 ENCOUNTER — Ambulatory Visit: Admitting: Physical Therapy

## 2024-03-02 ENCOUNTER — Ambulatory Visit: Admitting: Physical Therapy

## 2024-03-09 ENCOUNTER — Ambulatory Visit: Admitting: Physical Therapy

## 2024-03-31 ENCOUNTER — Other Ambulatory Visit: Payer: Self-pay | Admitting: Internal Medicine

## 2024-03-31 DIAGNOSIS — Z1231 Encounter for screening mammogram for malignant neoplasm of breast: Secondary | ICD-10-CM

## 2024-05-14 ENCOUNTER — Ambulatory Visit
Admission: RE | Admit: 2024-05-14 | Discharge: 2024-05-14 | Disposition: A | Discharge status: Home / Self Care | Source: Ambulatory Visit | Attending: Internal Medicine | Admitting: Internal Medicine

## 2024-05-14 DIAGNOSIS — Z1231 Encounter for screening mammogram for malignant neoplasm of breast: Secondary | ICD-10-CM | POA: Insufficient documentation

## 2024-05-20 ENCOUNTER — Other Ambulatory Visit: Payer: Self-pay | Admitting: Internal Medicine

## 2024-05-20 ENCOUNTER — Other Ambulatory Visit: Payer: Self-pay | Admitting: Family Medicine

## 2024-05-20 DIAGNOSIS — M5412 Radiculopathy, cervical region: Secondary | ICD-10-CM

## 2024-05-20 DIAGNOSIS — M5416 Radiculopathy, lumbar region: Secondary | ICD-10-CM

## 2024-05-20 DIAGNOSIS — R928 Other abnormal and inconclusive findings on diagnostic imaging of breast: Secondary | ICD-10-CM

## 2024-05-23 ENCOUNTER — Ambulatory Visit
Admission: RE | Admit: 2024-05-23 | Discharge: 2024-05-23 | Disposition: A | Discharge status: Home / Self Care | Source: Ambulatory Visit | Attending: Family Medicine | Admitting: Family Medicine

## 2024-05-23 DIAGNOSIS — M5412 Radiculopathy, cervical region: Secondary | ICD-10-CM

## 2024-05-23 DIAGNOSIS — M5416 Radiculopathy, lumbar region: Secondary | ICD-10-CM

## 2024-05-26 ENCOUNTER — Ambulatory Visit
Admission: RE | Admit: 2024-05-26 | Discharge: 2024-05-26 | Disposition: A | Source: Ambulatory Visit | Attending: Internal Medicine | Admitting: Internal Medicine

## 2024-05-26 ENCOUNTER — Ambulatory Visit
Admission: RE | Admit: 2024-05-26 | Discharge: 2024-05-26 | Disposition: A | Discharge status: Home / Self Care | Source: Ambulatory Visit | Attending: Internal Medicine | Admitting: Internal Medicine

## 2024-05-26 DIAGNOSIS — R928 Other abnormal and inconclusive findings on diagnostic imaging of breast: Secondary | ICD-10-CM | POA: Diagnosis present

## 2024-06-04 ENCOUNTER — Other Ambulatory Visit: Payer: Self-pay | Admitting: Dermatology

## 2024-07-20 ENCOUNTER — Other Ambulatory Visit: Payer: Self-pay | Admitting: Cardiovascular Disease

## 2024-08-01 ENCOUNTER — Encounter: Payer: Self-pay | Admitting: Dermatology

## 2024-08-03 ENCOUNTER — Encounter: Payer: Self-pay | Admitting: Cardiovascular Disease

## 2024-10-26 ENCOUNTER — Ambulatory Visit: Admitting: Cardiovascular Disease

## 2024-12-22 ENCOUNTER — Ambulatory Visit: Admitting: Dermatology
